# Patient Record
Sex: Male | Born: 1950
Health system: Southern US, Community
[De-identification: ages and names within clinical notes are randomized; demographics above are authoritative.]

## PROBLEM LIST (undated history)

## (undated) DIAGNOSIS — Z923 Personal history of irradiation: Secondary | ICD-10-CM

## (undated) DIAGNOSIS — D229 Melanocytic nevi, unspecified: Secondary | ICD-10-CM

## (undated) DIAGNOSIS — C4492 Squamous cell carcinoma of skin, unspecified: Secondary | ICD-10-CM

## (undated) DIAGNOSIS — R972 Elevated prostate specific antigen [PSA]: Secondary | ICD-10-CM

## (undated) DIAGNOSIS — G44029 Chronic cluster headache, not intractable: Secondary | ICD-10-CM

## (undated) DIAGNOSIS — D039 Melanoma in situ, unspecified: Secondary | ICD-10-CM

## (undated) DIAGNOSIS — C44320 Squamous cell carcinoma of skin of unspecified parts of face: Secondary | ICD-10-CM

## (undated) DIAGNOSIS — C44622 Squamous cell carcinoma of skin of right upper limb, including shoulder: Secondary | ICD-10-CM

## (undated) DIAGNOSIS — E785 Hyperlipidemia, unspecified: Secondary | ICD-10-CM

## (undated) DIAGNOSIS — C4491 Basal cell carcinoma of skin, unspecified: Secondary | ICD-10-CM

## (undated) DIAGNOSIS — K219 Gastro-esophageal reflux disease without esophagitis: Secondary | ICD-10-CM

## (undated) DIAGNOSIS — C439 Malignant melanoma of skin, unspecified: Secondary | ICD-10-CM

## (undated) DIAGNOSIS — C61 Malignant neoplasm of prostate: Secondary | ICD-10-CM

## (undated) HISTORY — PX: PROSTATE SURGERY: SHX751

## (undated) HISTORY — DX: Hyperlipidemia, unspecified: E78.5

## (undated) HISTORY — PX: APPENDECTOMY: SHX54

## (undated) HISTORY — PX: COLONOSCOPY: SHX174

---
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
## 1997-08-18 DIAGNOSIS — C44622 Squamous cell carcinoma of skin of right upper limb, including shoulder: Secondary | ICD-10-CM

## 1997-08-18 HISTORY — DX: Squamous cell carcinoma of skin of right upper limb, including shoulder: C44.622

## 1998-01-08 DIAGNOSIS — D039 Melanoma in situ, unspecified: Secondary | ICD-10-CM

## 1998-01-08 HISTORY — DX: Melanoma in situ, unspecified: D03.9

## 2006-02-20 DIAGNOSIS — D229 Melanocytic nevi, unspecified: Secondary | ICD-10-CM

## 2006-02-20 DIAGNOSIS — C4491 Basal cell carcinoma of skin, unspecified: Secondary | ICD-10-CM

## 2006-02-20 DIAGNOSIS — C439 Malignant melanoma of skin, unspecified: Secondary | ICD-10-CM

## 2006-02-20 HISTORY — DX: Melanocytic nevi, unspecified: D22.9

## 2006-02-20 HISTORY — DX: Malignant melanoma of skin, unspecified: C43.9

## 2006-02-20 HISTORY — DX: Basal cell carcinoma of skin, unspecified: C44.91

## 2008-06-01 ENCOUNTER — Encounter: Admission: RE | Admit: 2008-06-01 | Discharge: 2008-06-01 | Payer: Self-pay | Admitting: Orthopedic Surgery

## 2009-07-27 DIAGNOSIS — D039 Melanoma in situ, unspecified: Secondary | ICD-10-CM

## 2009-07-27 HISTORY — DX: Melanoma in situ, unspecified: D03.9

## 2010-02-27 ENCOUNTER — Encounter: Payer: Self-pay | Admitting: Orthopedic Surgery

## 2011-06-20 ENCOUNTER — Other Ambulatory Visit: Payer: Self-pay | Admitting: Internal Medicine

## 2011-07-25 ENCOUNTER — Ambulatory Visit (INDEPENDENT_AMBULATORY_CARE_PROVIDER_SITE_OTHER): Payer: BC Managed Care – PPO | Admitting: Internal Medicine

## 2011-07-25 VITALS — BP 104/65 | HR 60 | Temp 97.9°F | Resp 16 | Ht 70.5 in | Wt 210.0 lb

## 2011-07-25 DIAGNOSIS — Z0000 Encounter for general adult medical examination without abnormal findings: Secondary | ICD-10-CM

## 2011-07-25 DIAGNOSIS — E785 Hyperlipidemia, unspecified: Secondary | ICD-10-CM

## 2011-07-25 LAB — LIPID PANEL
LDL Cholesterol: 161 mg/dL — ABNORMAL HIGH (ref 0–99)
Total CHOL/HDL Ratio: 4.9 Ratio
VLDL: 34 mg/dL (ref 0–40)

## 2011-07-25 LAB — POCT CBC
Hemoglobin: 14.7 g/dL (ref 14.1–18.1)
Lymph, poc: 1.6 (ref 0.6–3.4)
MCHC: 32.5 g/dL (ref 31.8–35.4)
MPV: 10.2 fL (ref 0–99.8)
POC Granulocyte: 3.4 (ref 2–6.9)
POC MID %: 8.9 %M (ref 0–12)
RDW, POC: 13.1 %

## 2011-07-25 LAB — COMPREHENSIVE METABOLIC PANEL
ALT: 27 U/L (ref 0–53)
Alkaline Phosphatase: 53 U/L (ref 39–117)
CO2: 29 mEq/L (ref 19–32)
Sodium: 141 mEq/L (ref 135–145)
Total Bilirubin: 0.6 mg/dL (ref 0.3–1.2)
Total Protein: 6.6 g/dL (ref 6.0–8.3)

## 2011-07-25 MED ORDER — FLUOXETINE HCL 40 MG PO CAPS: 40.0000 mg | ORAL_CAPSULE | Freq: Every day | ORAL | Status: DC

## 2011-07-25 NOTE — Progress Notes (Addendum)
  Subjective:    Patient ID: Allen Burnett, male    DOB: 02-Mar-1950, 61 y.o.   MRN: [id number]  HPI Here for annual physical examination/he's doing very well, very active, very athletic, controlling his weight fairly well. If he could reduce the stress of his work things would be great. 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 He continues to use fluoxetine and this helps with anxiety.  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 He appears well-developed and well-nourished.  HENT:  Head: Normocephalic.  Right Ear: External ear normal.  Left Ear: External ear normal.  Nose: Nose normal.  Mouth/Throat: Oropharynx is clear and moist.  Eyes: Conjunctivae and EOM are normal. Pupils are equal, round, and reactive to light. Right eye exhibits no discharge. Left eye exhibits no discharge.  Neck: Normal range of motion. Neck supple. No thyromegaly present.  Cardiovascular: Normal rate, regular rhythm, normal heart sounds and intact distal pulses.   No murmur heard. Pulmonary/Chest: Effort normal and breath sounds normal.  Abdominal: Soft. Bowel sounds are normal. He exhibits no mass.  Musculoskeletal: He exhibits no edema.  Neurological: He is alert and oriented to person, place, and time. He has normal reflexes.  Skin: No rash noted.  Psychiatric: He has a normal mood and affect. His behavior is normal. Judgment and thought content normal.          Assessment & Plan:  Annual  physical- Doing very well  Recheck lipid profile and PSA Continue fluoxetine 40 mg daily-although he says he takes it every other day but is happy with the outcome  Addendum: Your PSA value has gone up again and it is time to consult urology to consider whether you need a biopsy. Your past values: 2006=1.8 2007=3.1 and you repeated it with Dr Hillis Range and it was 2.1 2009=3.4 2101=3.46 2011=3.16 2012=3.76 Although this could be due to benign prostate hypertrophy,The rate of rise and the absolute value both suggest that you need A biopsy.

## 2011-07-26 ENCOUNTER — Encounter: Payer: Self-pay | Admitting: Internal Medicine

## 2011-07-26 ENCOUNTER — Other Ambulatory Visit: Payer: Self-pay | Admitting: Internal Medicine

## 2011-07-26 DIAGNOSIS — R972 Elevated prostate specific antigen [PSA]: Secondary | ICD-10-CM

## 2011-10-11 HISTORY — PX: PROSTATE BIOPSY: SHX241

## 2011-11-01 ENCOUNTER — Telehealth: Payer: Self-pay

## 2011-11-01 NOTE — Telephone Encounter (Signed)
PT STATES HE HAVE BEEN DIAGNOSED WITH CANCER AND WOULD LIKE TO SPEAK WITH DR DOOLITTLE BEFORE HE GOES NEXT WEEK FOR HIS CONSULTATION PLEASE CALL [phone]

## 2011-11-08 ENCOUNTER — Encounter: Payer: Self-pay | Admitting: *Deleted

## 2011-11-08 DIAGNOSIS — C61 Malignant neoplasm of prostate: Secondary | ICD-10-CM | POA: Insufficient documentation

## 2011-11-16 ENCOUNTER — Ambulatory Visit: Payer: Self-pay

## 2011-11-16 ENCOUNTER — Ambulatory Visit: Admission: RE | Admit: 2011-11-16 | Payer: Self-pay | Source: Ambulatory Visit | Admitting: Radiation Oncology

## 2011-11-21 ENCOUNTER — Encounter: Payer: Self-pay | Admitting: Radiation Oncology

## 2011-11-21 ENCOUNTER — Ambulatory Visit
Admission: RE | Admit: 2011-11-21 | Discharge: 2011-11-21 | Disposition: A | Discharge status: Home / Self Care | Payer: BC Managed Care – PPO | Source: Ambulatory Visit | Attending: Radiation Oncology | Admitting: Radiation Oncology

## 2011-11-21 VITALS — BP 140/72 | HR 74 | Temp 97.8°F | Resp 20 | Wt 218.9 lb

## 2011-11-21 DIAGNOSIS — C61 Malignant neoplasm of prostate: Secondary | ICD-10-CM

## 2011-11-21 DIAGNOSIS — Z510 Encounter for antineoplastic radiation therapy: Secondary | ICD-10-CM | POA: Insufficient documentation

## 2011-11-21 HISTORY — DX: Elevated prostate specific antigen (PSA): R97.20

## 2011-11-21 HISTORY — DX: Malignant neoplasm of prostate: C61

## 2011-11-21 NOTE — Progress Notes (Signed)
New consult Prostate Cancer DX 10/11/11 : Adenocarcinoma,gleason= 3+3=6,PSA=5.39,volume=30cc Alert,oriented x3, no c/o of urinary issues, regular bowel movements, up at night 2-3x to void, Interested in [employer] children 1 brother living Colon Cancer dx age 60, now 16

## 2011-11-21 NOTE — Addendum Note (Signed)
Encounter addended by: Maryln Gottron, MD on: 11/21/2011 10:24 AM<BR>     Documentation filed: Notes Section

## 2011-11-21 NOTE — Progress Notes (Addendum)
[facility] Radiation Oncology NEW PATIENT EVALUATION  Name: Allen Burnett MRN: [id number]  Date:   11/21/2011           DOB: [birth year]  Status: outpatient   CC:   Valetta Fuller, MD , Dr. Ellamae Sia, Dr. Marcine Matar   REFERRING PHYSICIAN: Valetta Fuller, MD   DIAGNOSIS:  Stage TI C. favorable risk adenocarcinoma prostate  HISTORY OF PRESENT ILLNESS:  Allen Burnett is a 61 y.o. male who is seen today for the courtesy of Dr. Isabel Caprice and Dr. Retta Diones of for evaluation of his stage TI C. favorable risk adenocarcinoma prostate. He was noted to have a gradual rise in his PSA from 3.4  In 2009 to 4.85 by June of 2013. He was initially seen by Dr. Retta Diones approximate 7 years ago with his PSA was just over 3. A repeat PSA was down to 2.1, and close observation was recommended. More recently, he was referred back to Dr. Retta Diones for further evaluation for his PSA of 4.85. This was repeated on 09/11/2011 in his PSA was 5.39.Marland Kitchen He underwent ultrasound-guided biopsies on 10/11/2011. His then have Gleason 6 (3+3) involving less than 5% of one core from right lateral base, less than 5% of one core from the right lateral mid gland, 5% of one core from the right mid gland, 20% of one core from the right apex, 40% of one core from the left lateral base, 5% of one core from the left lateral apex and 10% of one core from the left apex. His gland volume was 30 cc. He does have mild urinary obstructive symptomatology with an I PSS score of 12. No GI difficulties. He is potent. He is quite active and very fit. He was seen for a second surgical opinion with Dr. Isabel Caprice. He wants to avoid surgery.  PREVIOUS RADIATION THERAPY: No   PAST MEDICAL HISTORY:  has a past medical history of PSA elevation; Prostate cancer (10/11/11); and GERD (gastroesophageal reflux disease).     PAST SURGICAL HISTORY:  Past Surgical History  Procedure Date  . Prostate biopsy 10/11/11    gleason  3+3=6,PSA=5.39,Adenocarcinoma  . Appendectomy     age 36     FAMILY HISTORY: family history includes Cancer (age of onset:50) in his brother. His father.a heart attack followed by a stroke at age 73. His mother is 52, alive and well. No family history of prostate cancer.   SOCIAL HISTORY:  reports that he quit smoking about 27 years ago. His smoking use included Cigarettes. He has a 10 pack-year smoking history. He has quit using smokeless tobacco. His smokeless tobacco use included Chew. He reports that he drinks alcohol. He reports that he does not use illicit drugs. Married with 2 children. One child is a PA in [facility]. He works in Administrator, Civil Service.   ALLERGIES: Review of patient's allergies indicates no known allergies.   MEDICATIONS:  Current Outpatient Prescriptions  Medication Sig Dispense Refill  . diphenhydramine-acetaminophen (TYLENOL PM) 25-500 MG TABS Take 1 tablet by mouth at bedtime as needed.      . fish oil-omega-3 fatty acids 1000 MG capsule Take 1 g by mouth daily.      Marland Kitchen FLUoxetine (PROZAC) 40 MG capsule Take 40 mg by mouth daily. Takes 2-3x week for cluster headaches      . lansoprazole (PREVACID) 30 MG capsule Take 30 mg by mouth as needed.      . Melatonin 5 MG TABS Take 2 tablets  by mouth at bedtime as needed.      . Multiple Vitamin (MULTIVITAMIN) tablet Take 1 tablet by mouth daily.      Docia Barrier IN Inhale into the lungs.      . predniSONE (STERAPRED UNI-PAK) 10 MG tablet Take 10 mg by mouth daily.      . SUMAtriptan (IMITREX) 6 MG/0.5ML SOLN injection Inject 6 mg into the skin as needed. F      . verapamil (CALAN) 40 MG tablet Take 40 mg by mouth.      . DISCONTD: FLUoxetine (PROZAC) 40 MG capsule Take 1 capsule (40 mg total) by mouth daily.  90 capsule  3     REVIEW OF SYSTEMS:  Pertinent items are noted in HPI.    PHYSICAL EXAM: Somewhat anxious 61 year old white male appearing his stated age. Wt Readings from Last 3 Encounters:   11/21/11 218 lb 14.4 oz (99.292 kg)  07/25/11 210 lb (95.255 kg)   Temp Readings from Last 3 Encounters:  11/21/11 97.8 F (36.6 C) Oral  07/25/11 97.9 F (36.6 C) Oral   BP Readings from Last 3 Encounters:  11/21/11 140/72  07/25/11 104/65   Pulse Readings from Last 3 Encounters:  11/21/11 74  07/25/11 60   Head and neck examination: Grossly unremarkable. Nodes: Without palpable cervical or supraclavicular lymphadenopathy. Chest: Lungs clear. Heart: Regular in rhythm. Abdomen: Without masses organomegaly. Genitalia: Normal to inspection. Rectal: The prostate gland is normal in size and is without focal induration or nodularity. Extremities: Without edema. Neurologic examination: Grossly nonfocal    LABORATORY DATA:  Lab Results  Component Value Date   WBC 5.5 07/25/2011   HGB 14.7 07/25/2011   HCT 45.3 07/25/2011   MCV 91.6 07/25/2011   Lab Results  Component Value Date   NA 141 07/25/2011   K 4.3 07/25/2011   CL 105 07/25/2011   CO2 29 07/25/2011   Lab Results  Component Value Date   ALT 27 07/25/2011   AST 20 07/25/2011   ALKPHOS 53 07/25/2011   BILITOT 0.6 07/25/2011   PSA 5.39 from 09/11/2011.   IMPRESSION: Stage TI C. favorable risk adenocarcinoma prostate. I explained to the patient that his prognosis is related to his stage, Gleason score, and PSA level. All are favorable. His prognosis is also related to his disease volume and PSA doubling time. He appears to have a slow PSA doubling time, and he has low volume disease, yet multicentric disease. We discussed surgery versus close surveillance versus radiation therapy. We spent a good deal of time discussing seed implantation and also external beam/IMRT. We discussed the potential acute and late toxicities of each therapy. I told the patient that based on his age and performance status, I would strongly still consider surgery even though the expected cure rate with radiation therapy would be equivalent. He is opposed to  surgery. After lengthy discussion he is most interested in seed implantation which I think would be a good choice for him. We perform a CT arch this morning his prostate volume was measured to be 33 cc with a prosthetic length 3.3 cm, correlating with Dr. Lenoria Chime measurements. Consent is signed today. We'll move ahead with scheduling of his seed implant for mid to late November with Dr. Retta Diones.   PLAN: As discussed above.   I spent 60 minutes minutes face to face with the patient and more than 50% of that time was spent in counseling and/or coordination of care.

## 2011-11-21 NOTE — Progress Notes (Signed)
Please see the Nurse Progress Note in the MD Initial Consult Encounter for this patient. 

## 2011-11-21 NOTE — Progress Notes (Addendum)
Simulation/treatment planning note: The patient underwent a CT arch study for preparation of his prostate seed implant. He was taken to the CT simulator. His pelvis was scanned. The CT data set was sent to the treatment planning system. I contoured his prostate in his prostate volume is 33.8 cc and prosthetic length 3.3 cm. The data set was then projected over the bony arch and there is no bony arch interference. I prescribing 14,500 cGy to his prostate with.I-125 seeds utilizing the [employer] system.

## 2011-11-21 NOTE — Addendum Note (Signed)
Encounter addended by: Maryln Gottron, MD on: 11/21/2011  9:53 AM<BR>     Documentation filed: Notes Section

## 2011-11-22 ENCOUNTER — Encounter: Payer: Self-pay | Admitting: Radiation Oncology

## 2011-11-22 NOTE — Progress Notes (Signed)
   CT arch study. Prostate volume 33.8 cc, length 3.3 cm. See dictated note.

## 2011-11-29 ENCOUNTER — Encounter: Payer: Self-pay | Admitting: *Deleted

## 2011-12-01 ENCOUNTER — Ambulatory Visit (HOSPITAL_BASED_OUTPATIENT_CLINIC_OR_DEPARTMENT_OTHER)
Admission: RE | Admit: 2011-12-01 | Discharge: 2011-12-01 | Disposition: A | Discharge status: Home / Self Care | Payer: BC Managed Care – PPO | Source: Ambulatory Visit | Attending: Urology | Admitting: Urology

## 2011-12-01 ENCOUNTER — Encounter (HOSPITAL_BASED_OUTPATIENT_CLINIC_OR_DEPARTMENT_OTHER)
Admission: RE | Admit: 2011-12-01 | Discharge: 2011-12-01 | Disposition: A | Discharge status: Home / Self Care | Payer: BC Managed Care – PPO | Source: Ambulatory Visit | Attending: Urology | Admitting: Urology

## 2011-12-01 DIAGNOSIS — Z01811 Encounter for preprocedural respiratory examination: Secondary | ICD-10-CM | POA: Insufficient documentation

## 2011-12-01 DIAGNOSIS — C61 Malignant neoplasm of prostate: Secondary | ICD-10-CM | POA: Insufficient documentation

## 2011-12-20 ENCOUNTER — Telehealth: Payer: Self-pay | Admitting: *Deleted

## 2011-12-20 NOTE — Telephone Encounter (Signed)
CALLED PATIENT TO REMIND OF APPT., LVM FOR A RETURN CALL 

## 2011-12-21 ENCOUNTER — Encounter (HOSPITAL_BASED_OUTPATIENT_CLINIC_OR_DEPARTMENT_OTHER): Payer: Self-pay | Admitting: *Deleted

## 2011-12-21 LAB — COMPREHENSIVE METABOLIC PANEL
ALT: 24 U/L (ref 0–53)
AST: 16 U/L (ref 0–37)
CO2: 24 mEq/L (ref 19–32)
Calcium: 9.1 mg/dL (ref 8.4–10.5)
Chloride: 104 mEq/L (ref 96–112)
GFR calc non Af Amer: 79 mL/min — ABNORMAL LOW (ref >90)
Sodium: 137 mEq/L (ref 135–145)
Total Bilirubin: 0.3 mg/dL (ref 0.3–1.2)

## 2011-12-21 LAB — CBC
Platelets: 239 10*3/uL (ref 150–400)
RBC: 4.55 MIL/uL (ref 4.22–5.81)
WBC: 13.4 10*3/uL — ABNORMAL HIGH (ref 4.0–10.5)

## 2011-12-21 LAB — APTT: aPTT: 26 seconds (ref 24–37)

## 2011-12-21 NOTE — Progress Notes (Signed)
SPOKE W/ WIFE.  NPO AFTER MN. ARRIVES AT 0615. CURRENT LAB WORK, CXR AND EKG IN EPIC AND CHART. WILL TAKE PREVACID AND IF TAKES IN AM ALSO WILL TAKE  PREDNISONE AND VERAPAMIL AM OF SURG  W/ SIP OF WATER .  WILL DO FLEET ENEMA AM OF SURG. 

## 2011-12-27 ENCOUNTER — Telehealth: Payer: Self-pay | Admitting: *Deleted

## 2011-12-27 NOTE — Telephone Encounter (Signed)
CALLED PATIENT TO REMIND OF PROCEDURE FOR 12-28-11, LVM FOR A RETURN CALL

## 2011-12-27 NOTE — Anesthesia Preprocedure Evaluation (Addendum)
Anesthesia Evaluation  Patient identified by MRN, date of birth, ID band Patient awake    Reviewed: Allergy & Precautions, H&P , NPO status , Patient's Chart, lab work & pertinent test results  Airway Mallampati: II TM Distance: >3 FB Neck ROM: full    Dental No notable dental hx. (+) Teeth Intact and Dental Advisory Given   Pulmonary neg pulmonary ROS,  breath sounds clear to auscultation  Pulmonary exam normal       Cardiovascular Exercise Tolerance: Good negative cardio ROS  Rhythm:regular Rate:Normal     Neuro/Psych  Headaches, negative neurological ROS  negative psych ROS   GI/Hepatic negative GI ROS, Neg liver ROS, GERD-  Medicated and Controlled,  Endo/Other  negative endocrine ROS  Renal/GU negative Renal ROS  negative genitourinary   Musculoskeletal   Abdominal   Peds  Hematology negative hematology ROS (+)   Anesthesia Other Findings   Reproductive/Obstetrics negative OB ROS                          Anesthesia Physical Anesthesia Plan  ASA: II  Anesthesia Plan: General   Post-op Pain Management:    Induction: Intravenous  Airway Management Planned: LMA  Additional Equipment:   Intra-op Plan:   Post-operative Plan:   Informed Consent: I have reviewed the patients History and Physical, chart, labs and discussed the procedure including the risks, benefits and alternatives for the proposed anesthesia with the patient or authorized representative who has indicated his/her understanding and acceptance.   Dental Advisory Given  Plan Discussed with: CRNA and Surgeon  Anesthesia Plan Comments:         Anesthesia Quick Evaluation

## 2011-12-27 NOTE — H&P (Cosign Needed)
Urology History and Physical Exam  CC: Prostate cancer  HPI: 61 year old male presents for brachytherapy.His history is as follows:   He  was sent by Dr. Nichola Sizer in August for evaluation and management of an elevated PSA. This had been checked recently in his office, and returned 4.85 in June, 2013. I saw him about 7 years ago. At that time, his PSA was slightly over 3, and was down to 2.1 on repeat. Over the past few years, his PSA trend has been as follows:  2009-3.4 2010-3.46 2011-3.16 2012-3.76  There is no family history of prostate cancer, and the patient denies any significant lower urinary tract symptomatology. He denies any erectile dysfunction.   Repeat PSA at [facility] was 5.39. He subsequently underwent ultrasound and biopsy of his prostate on 10/11/2011. Prostatic size was just at 30 cc. 7/12 biopsies came back, all with Gleason 3+3 equals 6 histology. Four of these biopsies on the right were positive, 3 on the left.   He was counseled on treatment and has chosen to undergo brachytherapy.  PMH: Past Medical History  Diagnosis Date  . PSA elevation     2009=3.4,2010=3.46,2011=3.16,2012=3.76,07/2011=4.85  . Prostate cancer DX  10/11/11    Adenocarcinoma,gleason 3+3=6,PSA=5.39   volume=30cc  . Acid reflux OCCASIONALLY--  TAKE PREVACID  . Chronic cluster headache [facility]    PRN OXYGEN VIA [facility] WHEN FEELS HEADACHE STARTING/  ON PREVENTION MEDS    PSH: Past Surgical History  Procedure Date  . Prostate biopsy 10/11/11    gleason 3+3=6,PSA=5.39,Adenocarcinoma  . Appendectomy AGE 64    Allergies: No Known Allergies  Medications: No prescriptions prior to admission     Social History: History   Social History  . Marital Status: Married    Spouse Name: N/A    Number of Children: 2  . Years of Education: N/A   Occupational History  .      advertising sales   Social History Main Topics  . Smoking status: Former Smoker -- 1.0  packs/day for 10 years    Types: Cigarettes    Quit date: 07/24/1984  . Smokeless tobacco: Former Neurosurgeon    Types: Chew  . Alcohol Use: Yes     Comment: beer on weekends  . Drug Use: No  . Sexually Active: Not on file   Other Topics Concern  . Not on file   Social History Narrative  . No narrative on file    Family History: Family History  Problem Relation Age of Onset  . Cancer Brother 36    colon cancer now age 58    Review of Systems: Genitourinary, constitutional, skin, eye, otolaryngeal, hematologic/lymphatic, cardiovascular, pulmonary, endocrine, musculoskeletal, gastrointestinal, neurological and psychiatric system(s) were reviewed and pertinent findings if present are noted.  Genitourinary: urinary frequency and nocturia.      Physical Exam: @VITALS2 @ General: No acute distress.  Awake. Head:  Normocephalic.  Atraumatic. ENT:  EOMI.  Mucous membranes moist Neck:  Supple.  No lymphadenopathy. CV:  S1 present. S2 present. Regular rate. Pulmonary: Equal effort bilaterally.  Clear to auscultation bilaterally. Abdomen: Soft.  Non tender to palpation. Skin:  Normal turgor.  No visible rash. Extremity: No gross deformity of bilateral upper extremities.  No gross deformity of    bilateral lower extremities. Neurologic: Alert. Appropriate mood.    Studies:  No results found for this basename: HGB:2,WBC:2,PLT:2 in the last 72 hours  No results found for this basename: NA:2,K:2,CL:2,CO2:2,BUN:2,CREATININE:2,CALCIUM:2,MAGNESIUM:2,GFRNONAA:2,GFRAA:2 in the last 72  hours   No results found for this basename: PT:2,INR:2,APTT:2 in the last 72 hours   No components found with this basename: ABG:2    Assessment:  Stage T1c adenocarcinoma of the prostate  Plan: I-125 brachytherapy

## 2011-12-28 ENCOUNTER — Encounter (HOSPITAL_BASED_OUTPATIENT_CLINIC_OR_DEPARTMENT_OTHER): Payer: Self-pay | Admitting: *Deleted

## 2011-12-28 ENCOUNTER — Encounter (HOSPITAL_BASED_OUTPATIENT_CLINIC_OR_DEPARTMENT_OTHER): Payer: Self-pay | Admitting: Anesthesiology

## 2011-12-28 ENCOUNTER — Ambulatory Visit (HOSPITAL_BASED_OUTPATIENT_CLINIC_OR_DEPARTMENT_OTHER)
Admission: RE | Admit: 2011-12-28 | Discharge: 2011-12-28 | Disposition: A | Discharge status: Home / Self Care | Payer: BC Managed Care – PPO | Source: Ambulatory Visit | Attending: Urology | Admitting: Urology

## 2011-12-28 ENCOUNTER — Ambulatory Visit (HOSPITAL_BASED_OUTPATIENT_CLINIC_OR_DEPARTMENT_OTHER): Payer: BC Managed Care – PPO | Admitting: Anesthesiology

## 2011-12-28 ENCOUNTER — Encounter: Payer: Self-pay | Admitting: Radiation Oncology

## 2011-12-28 ENCOUNTER — Ambulatory Visit (HOSPITAL_COMMUNITY): Payer: BC Managed Care – PPO

## 2011-12-28 ENCOUNTER — Encounter (HOSPITAL_BASED_OUTPATIENT_CLINIC_OR_DEPARTMENT_OTHER)
Admission: RE | Disposition: A | Discharge status: Home / Self Care | Payer: Self-pay | Source: Ambulatory Visit | Attending: Urology

## 2011-12-28 DIAGNOSIS — C61 Malignant neoplasm of prostate: Secondary | ICD-10-CM | POA: Insufficient documentation

## 2011-12-28 DIAGNOSIS — Z87891 Personal history of nicotine dependence: Secondary | ICD-10-CM | POA: Insufficient documentation

## 2011-12-28 DIAGNOSIS — K219 Gastro-esophageal reflux disease without esophagitis: Secondary | ICD-10-CM | POA: Insufficient documentation

## 2011-12-28 DIAGNOSIS — Z923 Personal history of irradiation: Secondary | ICD-10-CM

## 2011-12-28 DIAGNOSIS — Z01812 Encounter for preprocedural laboratory examination: Secondary | ICD-10-CM | POA: Insufficient documentation

## 2011-12-28 HISTORY — PX: RADIOACTIVE SEED IMPLANT: SHX5150

## 2011-12-28 HISTORY — DX: Gastro-esophageal reflux disease without esophagitis: K21.9

## 2011-12-28 HISTORY — DX: Chronic cluster headache, not intractable: G44.029

## 2011-12-28 HISTORY — DX: Personal history of irradiation: Z92.3

## 2011-12-28 SURGERY — INSERTION, RADIATION SOURCE, PROSTATE
Anesthesia: General | Site: Prostate | Wound class: Clean Contaminated

## 2011-12-28 MED ORDER — LIDOCAINE HCL (CARDIAC) 20 MG/ML IV SOLN
INTRAVENOUS | Status: DC | PRN
  Administered 2011-12-28: 100 mg via INTRAVENOUS

## 2011-12-28 MED ORDER — IOHEXOL 350 MG/ML SOLN
INTRAVENOUS | Status: DC | PRN
  Administered 2011-12-28: 3 mL

## 2011-12-28 MED ORDER — CIPROFLOXACIN HCL 250 MG PO TABS: 250.0000 mg | ORAL_TABLET | Freq: Two times a day (BID) | ORAL | Status: DC

## 2011-12-28 MED ORDER — LACTATED RINGERS IV SOLN
INTRAVENOUS | Status: DC
  Administered 2011-12-28 (×3): via INTRAVENOUS
  Filled 2011-12-28: qty 1000

## 2011-12-28 MED ORDER — FENTANYL CITRATE 0.05 MG/ML IJ SOLN
25.0000 ug | INTRAMUSCULAR | Status: DC | PRN
  Administered 2011-12-28 (×2): 25 ug via INTRAVENOUS
  Filled 2011-12-28: qty 1

## 2011-12-28 MED ORDER — FLEET ENEMA 7-19 GM/118ML RE ENEM
1.0000 | ENEMA | Freq: Once | RECTAL | Status: DC
  Filled 2011-12-28: qty 1

## 2011-12-28 MED ORDER — HYDROCODONE-ACETAMINOPHEN 5-500 MG PO CAPS: 1.0000 | ORAL_CAPSULE | ORAL | Status: DC | PRN

## 2011-12-28 MED ORDER — ACETAMINOPHEN 10 MG/ML IV SOLN
INTRAVENOUS | Status: DC | PRN
  Administered 2011-12-28: 1000 mg via INTRAVENOUS

## 2011-12-28 MED ORDER — DEXAMETHASONE SODIUM PHOSPHATE 4 MG/ML IJ SOLN
INTRAMUSCULAR | Status: DC | PRN
  Administered 2011-12-28: 10 mg via INTRAVENOUS

## 2011-12-28 MED ORDER — CIPROFLOXACIN IN D5W 400 MG/200ML IV SOLN
400.0000 mg | INTRAVENOUS | Status: AC
  Administered 2011-12-28: 400 mg via INTRAVENOUS
  Filled 2011-12-28: qty 200

## 2011-12-28 MED ORDER — EPHEDRINE SULFATE 50 MG/ML IJ SOLN
INTRAMUSCULAR | Status: DC | PRN
  Administered 2011-12-28: 5 mg via INTRAVENOUS
  Administered 2011-12-28 (×2): 10 mg via INTRAVENOUS

## 2011-12-28 MED ORDER — PROPOFOL 10 MG/ML IV BOLUS
INTRAVENOUS | Status: DC | PRN
  Administered 2011-12-28: 250 mg via INTRAVENOUS

## 2011-12-28 MED ORDER — KETOROLAC TROMETHAMINE 30 MG/ML IJ SOLN
INTRAMUSCULAR | Status: DC | PRN
  Administered 2011-12-28: 30 mg via INTRAVENOUS

## 2011-12-28 MED ORDER — MIDAZOLAM HCL 5 MG/5ML IJ SOLN
INTRAMUSCULAR | Status: DC | PRN
  Administered 2011-12-28: 2 mg via INTRAVENOUS

## 2011-12-28 MED ORDER — LACTATED RINGERS IV SOLN
INTRAVENOUS | Status: DC
  Filled 2011-12-28: qty 1000

## 2011-12-28 MED ORDER — STERILE WATER FOR IRRIGATION IR SOLN
Status: DC | PRN
  Administered 2011-12-28: 3000 mL via INTRAVESICAL

## 2011-12-28 MED ORDER — OXYCODONE HCL 5 MG PO TABS
5.0000 mg | ORAL_TABLET | Freq: Once | ORAL | Status: AC
  Administered 2011-12-28: 5 mg via ORAL
  Filled 2011-12-28: qty 1

## 2011-12-28 MED ORDER — ONDANSETRON HCL 4 MG/2ML IJ SOLN
INTRAMUSCULAR | Status: DC | PRN
  Administered 2011-12-28: 4 mg via INTRAVENOUS

## 2011-12-28 MED ORDER — FENTANYL CITRATE 0.05 MG/ML IJ SOLN
INTRAMUSCULAR | Status: DC | PRN
  Administered 2011-12-28 (×2): 50 ug via INTRAVENOUS
  Administered 2011-12-28 (×2): 25 ug via INTRAVENOUS

## 2011-12-28 SURGICAL SUPPLY — 26 items
BAG URINE DRAINAGE (UROLOGICAL SUPPLIES) ×2 IMPLANT
BLADE SURG ROTATE 9660 (MISCELLANEOUS) ×2 IMPLANT
CATH FOLEY 2WAY SLVR  5CC 16FR (CATHETERS) ×1
CATH FOLEY 2WAY SLVR 5CC 16FR (CATHETERS) ×1 IMPLANT
CATH ROBINSON RED A/P 20FR (CATHETERS) ×2 IMPLANT
CLOTH BEACON ORANGE TIMEOUT ST (SAFETY) ×2 IMPLANT
COVER MAYO STAND STRL (DRAPES) ×2 IMPLANT
COVER TABLE BACK 60X90 (DRAPES) ×2 IMPLANT
DRSG TEGADERM 4X4.75 (GAUZE/BANDAGES/DRESSINGS) ×2 IMPLANT
DRSG TEGADERM 8X12 (GAUZE/BANDAGES/DRESSINGS) ×2 IMPLANT
GLOVE BIO SURGEON STRL SZ 6.5 (GLOVE) ×2 IMPLANT
GLOVE BIO SURGEON STRL SZ7.5 (GLOVE) ×8 IMPLANT
GLOVE BIO SURGEON STRL SZ8 (GLOVE) ×4 IMPLANT
GLOVE ECLIPSE 6.0 STRL STRAW (GLOVE) ×2 IMPLANT
GLOVE ECLIPSE 8.0 STRL XLNG CF (GLOVE) IMPLANT
GLOVE INDICATOR 6.5 STRL GRN (GLOVE) ×2 IMPLANT
GOWN PREVENTION PLUS LG XLONG (DISPOSABLE) ×2 IMPLANT
GOWN STRL REIN XL XLG (GOWN DISPOSABLE) ×2 IMPLANT
HOLDER FOLEY CATH W/STRAP (MISCELLANEOUS) ×2 IMPLANT
PACK CYSTOSCOPY (CUSTOM PROCEDURE TRAY) ×2 IMPLANT
SPONGE GAUZE 4X4 12PLY (GAUZE/BANDAGES/DRESSINGS) ×2 IMPLANT
SYRINGE 10CC LL (SYRINGE) ×2 IMPLANT
UNDERPAD 30X30 INCONTINENT (UNDERPADS AND DIAPERS) ×4 IMPLANT
WATER STERILE IRR 3000ML UROMA (IV SOLUTION) ×2 IMPLANT
WATER STERILE IRR 500ML POUR (IV SOLUTION) ×2 IMPLANT
selectseed ×2 IMPLANT

## 2011-12-28 NOTE — Progress Notes (Signed)
End of treatment summary:  CC: Dr. Ellamae Sia, Dr. Marcine Matar  Diagnosis: Stage TI C. favorable risk adenocarcinoma prostate  Requesting physician: Dr. Marcine Matar  Intent: Curative  Date of implant: 12/28/2011  Site/dose: A prostate 14,500 cGy, isotope I-125 utilizing 75 seeds and 29 active needles. Individual seed activity 0.43 mCi per seed for a total implant activity of 32.1 mCi.  Narrative: The patient appears to have undergone a successful Nucletron seed Selectron implant with Dr. Retta Diones.  Plan: Followup visit with both of [place] in approximately 3 weeks. We will obtain a CT scan at that time to assess the quality of his implant and perform post implant dosimetry.

## 2011-12-28 NOTE — H&P (View-Only) (Signed)
SPOKE W/ WIFE.  NPO AFTER MN. ARRIVES AT 0615. CURRENT LAB WORK, CXR AND EKG IN EPIC AND CHART. WILL TAKE PREVACID AND IF TAKES IN AM ALSO WILL TAKE  PREDNISONE AND VERAPAMIL AM OF SURG  W/ SIP OF WATER .  WILL DO FLEET ENEMA AM OF SURG.

## 2011-12-28 NOTE — Progress Notes (Signed)
[facility] Radiation Oncology Brachytherapy Operative Procedure Note  Name: Jevonte Sung MRN: [id number]  Date:   11/24/2011           DOB: 15-Feb-1950  Status:outpatient    YQ:MVHQIONGE, Harrel Lemon, MD  Dr. Marcine Matar DIAGNOSIS: A 61 year old gentlemen with stage T1 C. adenocarcinoma of the prostate with a Gleason of 6 and a PSA of 5.4.  PROCEDURE: Insertion of radioactive I-125 seeds into the prostate gland.  RADIATION DOSE: 145 Gy, definitive therapy.  TECHNIQUE: Denali Gotto was brought to the operating room with Dr. Retta Diones. He was placed in the dorsolithotomy position. He was catheterized and a rectal tube was inserted. The perineum was shaved, prepped and draped. The ultrasound probe was then introduced into the rectum to see the prostate gland.  TREATMENT DEVICE: A needle grid was attached to the ultrasound probe stand and anchor needles were placed.  COMPLEX ISODOSE CALCULATION: The prostate was imaged in 3D using a sagittal sweep of the prostate probe. These images were transferred to the planning computer. There, the prostate, urethra and rectum were defined on each axial reconstructed image. Then, the software created an optimized plan and a few seed positions were adjusted. Then the accepted plan was uploaded to the seed Selectron afterloading unit.  SPECIAL TREATMENT PROCEDURE/SUPERVISION AND HANDLING: The Nucletron FIRST system was used to place the needles under sagittal guidance. A total of 29 needles were used to deposit 75 seeds in the prostate gland. The individual seed activity was 0.43 mCi for a total implant activity of 32.1 mCi.  COMPLEX SIMULATION: At the end of the procedure, an anterior radiograph of the pelvis was obtained to document seed positioning and count. Cystoscopy was performed to check the urethra and bladder.  MICRODOSIMETRY: At the end of the procedure, the patient was emitting 0.22 mrem/hr at 1 meter. Accordingly, he was  considered safe for hospital discharge.  PLAN: The patient will return to the radiation oncology clinic for post implant CT dosimetry in three weeks.

## 2011-12-28 NOTE — Op Note (Signed)
Preoperative diagnosis: Clinical stage TI C adenocarcinoma the prostate   Postoperative diagnosis: Same   Procedure: I-125 prostate seed implantation with Nucletron robotic implanter   Surgeon: Bertram Millard. Karlon Schlafer M.D.  Radiation Oncologist:Murray  Anesthesia: Gen.   Indications: Patient  was diagnosed with clinical stage TI C prostate cancer. We had extensive discussion with him about treatment options versus. He elected to proceed with seed implantation. He underwent consultation my office as well as with Dr. Dayton Scrape. He appeared to understand the advantages disadvantages potential risks of this treatment option. Full informed consent has been obtained.   Technique and findings: Patient was brought the operating room where he had successful induction of general anesthesia. He was placed in dorso-lithotomy position and prepped and draped in usual manner. Appropriate surgical timeout was performed. Radiation oncology department placed a transrectal ultrasound probe anchoring stand. Foley catheter with contrast in the balloon was inserted without difficulty. Anchoring needles were placed within the prostate. Rectal tube was placed. Real-time contouring of the urethra prostate and rectum were performed and the dosing parameters were established. Targeted dose was 145.00 gray.  I was then called  to the operating suite suite for placement of the needles. A second timeout was performed. All needle passage was done with real-time transrectal ultrasound guidance with the sagittal plane. A total of 29 needles were placed. The implantation itself was done with the robotic implanter. 75 active seeds were implanted. A Foley catheter was removed and flexible cystoscopy failed to show any seeds outside the prostate. Bladder showed mild trabeculations and bilateral Hutch diverticulae. No tumors or foreign bodies were noted.Each was entered and found to have normal appearing urothelium. The patient was brought to  recovery room in stable condition.

## 2011-12-28 NOTE — Transfer of Care (Signed)
Immediate Anesthesia Transfer of Care Note  Patient: Allen Burnett  Procedure(s) Performed: Procedure(s) (LRB): RADIOACTIVE SEED IMPLANT (N/A)  Patient Location: PACU  Anesthesia Type: General  Level of Consciousness: drowsy, arouses to name, follows commands  Airway & Oxygen Therapy: Patient Spontanous Breathing and Patient connected to face mask oxygen  Post-op Assessment: Report given to PACU RN and Post -op Vital signs reviewed and stable  Post vital signs: Reviewed and stable  Complications: No apparent anesthesia complications

## 2011-12-28 NOTE — Interval H&P Note (Signed)
History and Physical Interval Note:  12/28/2011 7:36 AM  Allen Burnett  has presented today for surgery, with the diagnosis of PROSTATE CANCER  The various methods of treatment have been discussed with the patient and family. After consideration of risks, benefits and other options for treatment, the patient has consented to  Procedure(s) (LRB) with comments: RADIOACTIVE SEED IMPLANT (N/A) - DR PORTABLE NEEDED as a surgical intervention .  The patient's history has been reviewed, patient examined, no change in status, stable for surgery.  I have reviewed the patient's chart and labs.  Questions were answered to the patient's satisfaction.     Chelsea Aus

## 2011-12-28 NOTE — Anesthesia Postprocedure Evaluation (Signed)
  Anesthesia Post-op Note  Patient: Allen Burnett  Procedure(s) Performed: Procedure(s) (LRB): RADIOACTIVE SEED IMPLANT (N/A)  Patient Location: PACU  Anesthesia Type: General  Level of Consciousness: awake and alert   Airway and Oxygen Therapy: Patient Spontanous Breathing  Post-op Pain: mild  Post-op Assessment: Post-op Vital signs reviewed, Patient's Cardiovascular Status Stable, Respiratory Function Stable, Patent Airway and No signs of Nausea or vomiting  Last Vitals:  Filed Vitals:   12/28/11 1030  BP: 115/62  Pulse: 77  Temp:   Resp: 10    Post-op Vital Signs: stable   Complications: No apparent anesthesia complications

## 2011-12-28 NOTE — Anesthesia Procedure Notes (Signed)
Procedure Name: LMA Insertion Date/Time: 12/28/2011 7:45 AM Performed by: Norva Pavlov Pre-anesthesia Checklist: Patient identified, Emergency Drugs available, Suction available and Patient being monitored Patient Re-evaluated:Patient Re-evaluated prior to inductionOxygen Delivery Method: Circle System Utilized Preoxygenation: Pre-oxygenation with 100% oxygen Intubation Type: IV induction Ventilation: Mask ventilation without difficulty LMA: LMA inserted LMA Size: 5.0 Number of attempts: 1 Airway Equipment and Method: bite block Placement Confirmation: positive ETCO2 Tube secured with: Tape Dental Injury: Teeth and Oropharynx as per pre-operative assessment

## 2011-12-28 NOTE — Discharge Instructions (Signed)
°  Post Anesthesia Home Care Instructions  Activity: Get plenty of rest for the remainder of the day. A responsible adult should stay with you for 24 hours following the procedure.  For the next 24 hours, DO NOT: -Drive a car -Advertising copywriter -Drink alcoholic beverages -Take any medication unless instructed by your physician -Make any legal decisions or sign important papers.  Meals: Start with liquid foods such as gelatin or soup. Progress to regular foods as tolerated. Avoid greasy, spicy, heavy foods. If nausea and/or vomiting occur, drink only clear liquids until the nausea and/or vomiting subsides. Call your physician if vomiting continues.  Special Instructions/Symptoms: Your throat may feel dry or sore from the anesthesia or the breathing tube placed in your throat during surgery. If this causes discomfort, gargle with warm salt water. The discomfort should disappear within 24 hours.    Radioactive Seed Implant Home Care Instructions   Activity:    Rest for the remainder of the day.  Do not drive  Or operate equipment today.  You may resume normal     activities in a few days as instructed by your physician, without risk of harmful radiation       exposure to those around you, provided you follow the time and distance precautions on the Radiation     Oncology Instruction Sheet.   Meals: Drink plenty of lipuids and eat light foods, such as gelatin or soup this evening .  You may return to normal meal    plan tomorrow.  Return To Work: You may return to work as instructed by Designer, multimedia.  Special Instruction: Remove foley catheter ***.  To remove foley, cut short end of  "Y" on foley catheter.  Drain water.  After the    water has drained, pull catheter from bladder.   If any seeds are found, use tweezers to pick up seeds and place in a glass    container of any kind and bring to your physician's office.  Call your physician if any of these symptoms occur:   Persistent or  heavy bleeding  Urine stream diminishes or stops completely after catheter is removed  Fever equal to or greater than 101 degrees F  Cloudy urine with a strong foul odor  Severe pain  You may feel some burning pain and/or hesitancy when you urinate after the catheter is removed.  These symptoms may increase over the next few weeks, but should diminish within forur to six weeks.  Applying moist heat to the lower abdomen or a hot tub bath may help relieve the pain.  If the discomfort becomes severe, please call your physician for additional medications.  Follow-up (Date of Return Visit to Physician): ***  Patient:_______________________________   @date @  Nurse:________________________________ @date @

## 2012-01-01 ENCOUNTER — Encounter (HOSPITAL_BASED_OUTPATIENT_CLINIC_OR_DEPARTMENT_OTHER): Payer: Self-pay | Admitting: Urology

## 2012-01-18 ENCOUNTER — Encounter: Payer: Self-pay | Admitting: Radiation Oncology

## 2012-01-22 ENCOUNTER — Telehealth: Payer: Self-pay | Admitting: *Deleted

## 2012-01-22 NOTE — Telephone Encounter (Signed)
CALLED PATIENT TO REMIND OF APPTS. FOR 01-23-12, CONFIRMED APPTS.

## 2012-01-22 NOTE — Telephone Encounter (Signed)
XXXX 

## 2012-01-23 ENCOUNTER — Encounter: Payer: Self-pay | Admitting: Radiation Oncology

## 2012-01-23 ENCOUNTER — Ambulatory Visit
Admission: RE | Admit: 2012-01-23 | Discharge: 2012-01-23 | Disposition: A | Discharge status: Home / Self Care | Payer: BC Managed Care – PPO | Source: Ambulatory Visit | Attending: Radiation Oncology | Admitting: Radiation Oncology

## 2012-01-23 VITALS — BP 122/76 | HR 65 | Temp 97.6°F | Wt 215.0 lb

## 2012-01-23 DIAGNOSIS — C61 Malignant neoplasm of prostate: Secondary | ICD-10-CM

## 2012-01-23 HISTORY — DX: Personal history of irradiation: Z92.3

## 2012-01-23 MED ORDER — TAMSULOSIN HCL 0.4 MG PO CAPS: 0.4000 mg | ORAL_CAPSULE | Freq: Every day | ORAL | Status: DC

## 2012-01-23 NOTE — Progress Notes (Signed)
Patient here for post seed Prostate Cancer 75 seeds, Dr. Marcine Matar 12/28/2011

## 2012-01-23 NOTE — Progress Notes (Signed)
CT complex simulation note: The patient was taken to the CT simulator. His pelvis was scanned. He appears to have a favorable seed distribution. This CT data set was sent to the [facility] system for contouring of his prostate and rectum. He is now ready for 3-D simulation to assess the quality of his implant.

## 2012-01-23 NOTE — Progress Notes (Signed)
Followup note:  Allen Burnett visits today approximately 3 weeks following his prostate seed implant with Dr. Retta Diones in the management of his stage TI C. favorable risk adenocarcinoma prostate. He is having urinary frequency, urgency and some slowing of his stream. He has nocturia x2-3. His symptoms are bothersome. No rectal symptoms. He tells me that he does not have an appointment to see Dr. Retta Diones until February. His CT scan from today shows an excellent seed distribution although there are a few seeds adjacent to the rectum.  Physical examination:  Wt Readings from Last 3 Encounters:  01/23/12 215 lb (97.523 kg)  12/28/11 216 lb 5 oz (98.119 kg)  12/28/11 216 lb 5 oz (98.119 kg)   Temp Readings from Last 3 Encounters:  01/23/12 97.6 F (36.4 C)   12/28/11 97.2 F (36.2 C) Oral  12/28/11 97.2 F (36.2 C) Oral   BP Readings from Last 3 Encounters:  01/23/12 122/76  12/28/11 117/70  12/28/11 117/70   Pulse Readings from Last 3 Encounters:  01/23/12 65  12/28/11 72  12/28/11 72   Rectal examination not performed.  Impression: I believe he does have some degree of urinary outlet obstruction and I'll start him on tamsulosin. If his symptoms do not improve then we will start Ditropan XL later this week.  Plan: The patient is to call me this Thursday morning to give me an update on his urinary status. We'll move ahead with his post implant dosimetry and forward the results to Dr. Retta Diones.

## 2012-01-23 NOTE — Progress Notes (Signed)
FU post Prostate Seed Implant.  Reports Hesistancy of stream with intermittent burning.   Denies any diarrhea or proctitis.

## 2012-01-25 ENCOUNTER — Encounter: Payer: Self-pay | Admitting: Radiation Oncology

## 2012-01-25 NOTE — Progress Notes (Signed)
Post implant CT dosimetry/3-D simulation note:  Allen Burnett underwent post-implant CT dosimetry/3-D simulation to assess the quality of his implant. His intraoperative prostate volume by ultrasound was 39.8 cc while his postoperative prostate volume by CT was 38.2 cc. Dose volume histograms were obtained for the prostate/rectum. His prostate D 90 is 112.4% and V100 94.2%, both excellent. 2.15 cc of rectum received the prescribed dose of 14,500 cGy. In summary, the patient has excellent post implant dosimetry with respect to coverage of his prostate, but he is at a slightly higher increased risk for late rectal toxicity.

## 2012-02-12 ENCOUNTER — Encounter: Payer: Self-pay | Admitting: Radiation Oncology

## 2012-02-12 NOTE — Progress Notes (Signed)
Chart note: Allen Burnett called me last week to tell me he was having more urinary frequency and urgency. I started him on Flomax last week with slight improvement of his symptoms 2 days later. He calls me back today to tell me that he has what sounds like "spasms" with urinary urgency. His stream remains slow. I told him to increase his Flomax to twice a day dosage, but I would like for Dr. Retta Diones to evaluate him before starting an anti-spasmodic medication just to make sure that he does not go into urinary retention if his symptoms are obstructive in etiology rather than spasmodic. I'm not sure if Dr. Retta Diones needs to perform a post void ultrasound to check for bladder emptying. He will call me back in 2 days. If he is not improved then I will kindly ask Dr. Retta Diones to see him.

## 2012-02-19 ENCOUNTER — Encounter: Payer: Self-pay | Admitting: Radiation Oncology

## 2012-02-19 NOTE — Progress Notes (Signed)
I called Allen Burnett this morning, he tells me that he is doing very well from a urinary standpoint taking Flomax twice a day. He'll continue this for the time being. He'll see Dr. Retta Diones later in February.

## 2012-03-07 ENCOUNTER — Other Ambulatory Visit: Payer: Self-pay | Admitting: Radiation Oncology

## 2012-03-07 MED ORDER — TAMSULOSIN HCL 0.4 MG PO CAPS: 0.4000 mg | ORAL_CAPSULE | Freq: Two times a day (BID) | ORAL | Status: DC

## 2012-05-06 ENCOUNTER — Encounter: Payer: Self-pay | Admitting: Radiation Oncology

## 2012-05-06 NOTE — Progress Notes (Signed)
Chart note: I called Allen Burnett to get an update on his urinary habits. He still taking Flomax by mouth twice a day, but his obstruction urinary symptoms are slowly improving. I told him that he had excellent coverage of his prostate, but slightly higher dose than planned to his rectum based on volume of rectum received the prescribed dose. However, this was rather focal, and remaining rectal dose volume histogram numbers look satisfactory. He'll see Dr. Retta Diones for a followup visit in late May after a PSA. Dr. Retta Diones will keep me posted on his progress. He was also advised to make sure that he tells him he gastroenterologist that plans on doing a colonoscopy in the future that he sure to tell him that he had a prostate seed implant and that the anterior rectal wall in the vicinity of the prostate should never be biopsied.

## 2012-09-18 ENCOUNTER — Telehealth: Payer: Self-pay | Admitting: Oncology

## 2012-09-18 NOTE — Telephone Encounter (Signed)
Received a call from Allen Burnett, patient of Dr. Rennie Plowman.  He is requesting a refill of flomax.  He uses [facility] on [address].

## 2012-09-18 NOTE — Telephone Encounter (Signed)
Called and spoke to [place] at [facility] on [address].  Verified that Mr Allen Burnett is taking Flomax 0.4 mg capsule by mouth 2 times daily.  A refill was given for Flomax 0.4 mg take 1 capsule by mouth 2 times daily.  Dispense 60 tablets. O refills.  Called Mr. Allen Burnett to let him know that the refill had been called in.

## 2012-10-02 ENCOUNTER — Encounter: Payer: Self-pay | Admitting: Internal Medicine

## 2012-10-02 ENCOUNTER — Ambulatory Visit (INDEPENDENT_AMBULATORY_CARE_PROVIDER_SITE_OTHER): Payer: BC Managed Care – PPO | Admitting: Internal Medicine

## 2012-10-02 VITALS — BP 98/68 | HR 59 | Temp 98.8°F | Resp 16 | Ht 70.25 in | Wt 198.0 lb

## 2012-10-02 DIAGNOSIS — E785 Hyperlipidemia, unspecified: Secondary | ICD-10-CM | POA: Insufficient documentation

## 2012-10-02 DIAGNOSIS — G44009 Cluster headache syndrome, unspecified, not intractable: Secondary | ICD-10-CM

## 2012-10-02 DIAGNOSIS — Z0000 Encounter for general adult medical examination without abnormal findings: Secondary | ICD-10-CM

## 2012-10-02 LAB — CBC WITH DIFFERENTIAL/PLATELET
Basophils Absolute: 0 10*3/uL (ref 0.0–0.1)
Basophils Relative: 1 % (ref 0–1)
Eosinophils Absolute: 0.1 10*3/uL (ref 0.0–0.7)
HCT: 42 % (ref 39.0–52.0)
MCH: 31 pg (ref 26.0–34.0)
MCHC: 34.3 g/dL (ref 30.0–36.0)
Monocytes Absolute: 0.5 10*3/uL (ref 0.1–1.0)
Neutro Abs: 2.8 10*3/uL (ref 1.7–7.7)
Neutrophils Relative %: 58 % (ref 43–77)
RDW: 14 % (ref 11.5–15.5)

## 2012-10-02 MED ORDER — FLUOXETINE HCL 40 MG PO CAPS: 40.0000 mg | ORAL_CAPSULE | Freq: Every day | ORAL | Status: DC

## 2012-10-02 NOTE — Progress Notes (Signed)
Subjective:    Patient ID: Allen Burnett, male    DOB: 1950/11/24, 62 y.o.   MRN: [id number]  [facility] F/u  Patient Active Problem List   Diagnosis Date Noted  . Other and unspecified hyperlipidemia 10/02/2012  . Prostate cancer 11/08/2011    -  Cluster HAs- dr adelman/now controlled-1 q 18ms resp to im imitrex  son dismissed from [employer] for pos test now at home creating great stress for his parents --must complete a drug treatment program before he can  Be rehired  Doing well in general/status post prostate cancer treatment last November with improved PSA Exercising 5 times a week Sleep okay Colonoscopy up to date Immunizations up to date  Current outpatient prescriptions:diphenhydramine-acetaminophen (TYLENOL PM) 25-500 MG TABS, Take 1 tablet by mouth at bedtime as needed.  FLUoxetine (PROZAC) 40 MG capsule, Take 1 capsule (40 mg total) by mouth daily. Takes 2-3x week for cluster headaches, lansoprazole (PREVACID) 30 MG capsule, Take 30 mg by mouth as needed.,  Melatonin 5 MG TABS, Take 2 tablets by mouth at bedtime as needed. rosuvastatin (CRESTOR) 5 MG tablet, Take 5 mg by mouth daily Tamsulosin HCl (FLOMAX) 0.4 MG CAPS post urological procedure, Take 1 capsule (0.4 mg total) by mouth 2 (two) times daily  fish oil-omega-3 fatty acids 1000 MG capsule, Take 1 g by mouth daily. ;  SUMAtriptan (IMITREX) 6 MG/0.5ML SOLN injection, Inject 6 mg into the skin as needed.   Review of Systems  Constitutional: Negative.   HENT: Negative.   Eyes: Negative.   Respiratory: Negative.   Cardiovascular: Negative.   Gastrointestinal: Negative.   Endocrine: Negative.   Genitourinary: Negative.   Skin: Negative.   Allergic/Immunologic: Negative.   Neurological: Negative.   Hematological: Negative.   Psychiatric/Behavioral: Negative.        Objective:   Physical Exam  Constitutional: He is oriented to person, place, and time. He appears well-developed and well-nourished.  HENT:   Head: Normocephalic.  Right Ear: External ear normal.  Left Ear: External ear normal.  Nose: Nose normal.  Mouth/Throat: Oropharynx is clear and moist.  Eyes: Conjunctivae and EOM are normal. Pupils are equal, round, and reactive to light.  Neck: Normal range of motion. Neck supple. No thyromegaly present.  Cardiovascular: Normal rate, regular rhythm, normal heart sounds and intact distal pulses.   No murmur heard. Pulmonary/Chest: Effort normal and breath sounds normal. He has no wheezes.  Abdominal: Soft. Bowel sounds are normal. There is no tenderness. There is no guarding.  Musculoskeletal: Normal range of motion. He exhibits no edema and no tenderness.  Lymphadenopathy:    He has no cervical adenopathy.  Neurological: He is alert and oriented to person, place, and time. He has normal reflexes. He displays normal reflexes. No cranial nerve deficit. He exhibits normal muscle tone. Coordination normal.  Skin: No rash noted.  Psychiatric: He has a normal mood and affect. His behavior is normal. Judgment and thought content normal.          Assessment & Plan:  Annual physical exam  Other and unspecified hyperlipidemia  Cluster headache  Hx prostate Ca  Meds ordered this encounter  Medications  . rosuvastatin (CRESTOR) 5 MG tablet    Sig: Take 5 mg by mouth daily.  Marland Kitchen FLUoxetine (PROZAC) 40 MG capsule    Sig: Take 1 capsule (40 mg total) by mouth daily. Takes 2-3x week for cluster headaches    Dispense:  90 capsule    Refill:  3   May  call for imitrex/others  F/u 1 yr

## 2012-10-03 LAB — LIPID PANEL
HDL: 62 mg/dL (ref >39)
LDL Cholesterol: 100 mg/dL — ABNORMAL HIGH (ref 0–99)
Total CHOL/HDL Ratio: 2.9 Ratio
Triglycerides: 86 mg/dL (ref <150)
VLDL: 17 mg/dL (ref 0–40)

## 2012-10-03 LAB — COMPREHENSIVE METABOLIC PANEL
AST: 22 U/L (ref 0–37)
Albumin: 4.3 g/dL (ref 3.5–5.2)
Alkaline Phosphatase: 49 U/L (ref 39–117)
BUN: 21 mg/dL (ref 6–23)
Creat: 1.04 mg/dL (ref 0.50–1.35)
Potassium: 4.4 mEq/L (ref 3.5–5.3)
Total Bilirubin: 0.6 mg/dL (ref 0.3–1.2)

## 2012-10-04 DIAGNOSIS — G44009 Cluster headache syndrome, unspecified, not intractable: Secondary | ICD-10-CM | POA: Insufficient documentation

## 2012-10-06 ENCOUNTER — Encounter: Payer: Self-pay | Admitting: Internal Medicine

## 2013-08-06 ENCOUNTER — Encounter: Payer: Self-pay | Admitting: Emergency Medicine

## 2013-08-06 ENCOUNTER — Ambulatory Visit (INDEPENDENT_AMBULATORY_CARE_PROVIDER_SITE_OTHER): Payer: BC Managed Care – PPO | Admitting: Emergency Medicine

## 2013-08-06 VITALS — BP 118/55 | HR 43 | Temp 99.0°F | Resp 16 | Ht 70.0 in | Wt 202.2 lb

## 2013-08-06 DIAGNOSIS — J0100 Acute maxillary sinusitis, unspecified: Secondary | ICD-10-CM

## 2013-08-06 DIAGNOSIS — I493 Ventricular premature depolarization: Secondary | ICD-10-CM

## 2013-08-06 DIAGNOSIS — F411 Generalized anxiety disorder: Secondary | ICD-10-CM

## 2013-08-06 DIAGNOSIS — F418 Other specified anxiety disorders: Secondary | ICD-10-CM

## 2013-08-06 DIAGNOSIS — I4949 Other premature depolarization: Secondary | ICD-10-CM

## 2013-08-06 MED ORDER — ALPRAZOLAM 0.5 MG PO TABS: ORAL_TABLET | ORAL | Status: DC

## 2013-08-06 MED ORDER — AMOXICILLIN 875 MG PO TABS: 875.0000 mg | ORAL_TABLET | Freq: Two times a day (BID) | ORAL | Status: DC

## 2013-08-06 NOTE — Progress Notes (Signed)
   Subjective:    Patient ID: Allen Burnett, male    DOB: May 19, 1950, 63 y.o.   MRN: [id number]  Sinusitis   patient came to the office with a chief complaint of a sinus infection. At triage he was noted to have a slow heart rate so an EKG was ordered prior to being seen. His primary problem is that he has had a cold the last 2 weeks and for the last 24 ours has had significant pain in the left maxillary area associated with purulence. He has a mild sore throat some dry cough no fever or chills    Review of Systems  Cardiovascular:       He denies chest pain or shortness of breath. He has had no presyncopal episodes. 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 He has no history of cardiac disease. He is a regular exerciser. He denies any associated chest pain or shortness of breath with exercise.       Objective:   Physical Exam patient is alert and cooperative he is in no distress. There is nasal congestion on the left. The posterior pharynx is red. Chest was clear to auscultation and percussion. Cardiac exam revealed an irregular rhythm with a skipped beat every third to fourth beat. I did have him do 20 seconds the pushups but the PVCs were persistent  EKG computer reading says possible previous septal infarct. He does have an unusual complex in V2 but no definite evidence of a previous MI. He alternates between trigeminy and quadrigeminy. The PVCs all appear unifocal      Assessment & Plan:  Referral made to cardiology for evaluation. He was given copies of his tracings. He was given information about PVCs. We'll treat his sinus infection with amoxicillin . He is under an incredible amount of stress at work. He did him a limited number of Xanax to help with the stress he is going through right now. He will let me know how does with this.

## 2013-08-06 NOTE — Patient Instructions (Addendum)
Premature Beats A premature beat is an extra heartbeat that happens earlier than normal. Premature beats are called premature atrial contractions (PACs) or premature ventricular contractions (PVCs) depending on the area of the heart where they start. CAUSES  Premature beats may be brought on by a variety of factors including:  Emotional stress.  Lack of sleep.  Caffeine.  Asthma medicines.  Stimulants.  Herbal teas.  Dietary supplements.  Alcohol. In most cases, premature beats are not dangerous and are not a sign of serious heart disease. Most patients evaluated for premature beats have completely normal heart function. Rarely, premature beats may be a sign of more significant heart problems or medical illness. SYMPTOMS  Premature beats may cause palpitations. This means you feel like your heart is skipping a beat or beating harder than usual. Sometimes, slight chest pain occurs with premature beats, lasting only a few seconds. This pain has been described as a "flopping" feeling inside the chest. In many cases, premature beats do not cause any symptoms and they are only detected when an electrocardiography test (EKG) or heart monitoring is performed. DIAGNOSIS  Your caregiver may run some tests to evaluate your heart such as an EKG or echocardiography. You may need to wear a portable heart monitor for several days to record the electrical activity of your heart. Blood testing may also be performed to check your electrolytes and thyroid function. TREATMENT  Premature beats usually go away with rest. If the problem continues, your caregiver will determine a treatment plan for you.  HOME CARE INSTRUCTIONS  Get plenty of rest over the next few days until your symptoms improve.  Avoid coffee, tea, alcohol, and soda (pop, cola).  Do not smoke. SEEK MEDICAL CARE IF:  Your symptoms continue after 1 to 2 days of rest.  You have new symptoms, such as chest pain or trouble  breathing. SEEK IMMEDIATE MEDICAL CARE IF:  You have severe chest pain or abdominal pain.  You have pain that radiates into the neck, arm, or jaw.  You faint or have extreme weakness.  You have shortness of breath.  Your heartbeat races for more than 5 seconds. MAKE SURE YOU:  Understand these instructions.  Will watch your condition.  Will get help right away if you are not doing well or get worse. Document Released: 03/02/2004 Document Revised: 04/17/2011 Document Reviewed: 09/26/2010 [facility] Patient Information 2015 [place], [place]. This information is not intended to replace advice given to you by your health care provider. Make sure you discuss any questions you have with your health care provider. Premature Ventricular Contraction Premature ventricular contraction (PVC) is an irregularity of the heart rhythm involving extra or skipped heartbeats. In some cases, they may occur without obvious cause or heart disease. Other times, they can be caused by an electrolyte change in the blood. These need to be corrected. They can also be seen when there is not enough oxygen going to the heart. A common cause of this is plaque or cholesterol buildup. This buildup decreases the blood supply to the heart. In addition, extra beats may be caused or aggravated by: Excessive smoking. Alcohol consumption. Caffeine. Certain medications Some street drugs. SYMPTOMS  The sensation of feeling your heart skipping a beat (palpitations). In many cases, the person may have no symptoms. SIGNS AND TESTS  A physical examination may show an occasional irregularity, but if the PVC beats do not happen often, they may not be found on physical exam. Blood pressure is usually normal. Other tests  that may find extra beats of the heart are: An EKG (electrocardiogram) A Holter monitor which can monitor your heart over longer periods of time An Angiogram (study of the heart arteries). TREATMENT  Usually  extra heartbeats do not need treatment. The condition is treated only if symptoms are severe or if extra beats are very frequent or are causing problems. An underlying cause, if discovered, may also require treatment.  Treatment may also be needed if there may be a risk for other more serious cardiac arrhythmias.  PREVENTION  Moderation in caffeine, alcohol, and tobacco use may reduce the risk of ectopic heartbeats in some people. Exercise often helps people who lead a sedentary (inactive) lifestyle. PROGNOSIS  PVC heartbeats are generally harmless and do not need treatment.  RISKS AND COMPLICATIONS  Ventricular tachycardia (occasionally). There usually are no complications. Other arrhythmias (occasionally). SEEK IMMEDIATE MEDICAL CARE IF:  You feel palpitations that are frequent or continual. You develop chest pain or other problems such as shortness of breath, sweating, or nausea and vomiting. You become light-headed or faint (pass out). You get worse or do not improve with treatment. Document Released: 09/10/2003 Document Revised: 04/17/2011 Document Reviewed: 03/22/2007 [facility] Patient Information 2015 [place], [place]. This information is not intended to replace advice given to you by your health care provider. Make sure you discuss any questions you have with your health care provider.

## 2013-09-11 ENCOUNTER — Other Ambulatory Visit: Payer: Self-pay | Admitting: Emergency Medicine

## 2013-09-12 ENCOUNTER — Other Ambulatory Visit: Payer: Self-pay | Admitting: Emergency Medicine

## 2013-09-13 NOTE — Telephone Encounter (Signed)
Faxed

## 2013-10-01 DIAGNOSIS — C44320 Squamous cell carcinoma of skin of unspecified parts of face: Secondary | ICD-10-CM

## 2013-10-01 HISTORY — DX: Squamous cell carcinoma of skin of unspecified parts of face: C44.320

## 2013-10-29 ENCOUNTER — Ambulatory Visit (INDEPENDENT_AMBULATORY_CARE_PROVIDER_SITE_OTHER): Payer: BC Managed Care – PPO | Admitting: Internal Medicine

## 2013-10-29 ENCOUNTER — Other Ambulatory Visit: Payer: Self-pay | Admitting: Emergency Medicine

## 2013-10-29 ENCOUNTER — Encounter: Payer: Self-pay | Admitting: Internal Medicine

## 2013-10-29 VITALS — BP 100/72 | HR 87 | Temp 97.9°F | Resp 16 | Ht 70.5 in | Wt 200.0 lb

## 2013-10-29 DIAGNOSIS — Z0000 Encounter for general adult medical examination without abnormal findings: Secondary | ICD-10-CM

## 2013-10-29 DIAGNOSIS — C61 Malignant neoplasm of prostate: Secondary | ICD-10-CM

## 2013-10-29 DIAGNOSIS — C439 Malignant melanoma of skin, unspecified: Secondary | ICD-10-CM | POA: Insufficient documentation

## 2013-10-29 DIAGNOSIS — B351 Tinea unguium: Secondary | ICD-10-CM

## 2013-10-29 DIAGNOSIS — G44019 Episodic cluster headache, not intractable: Secondary | ICD-10-CM

## 2013-10-29 DIAGNOSIS — G4700 Insomnia, unspecified: Secondary | ICD-10-CM | POA: Insufficient documentation

## 2013-10-29 DIAGNOSIS — M722 Plantar fascial fibromatosis: Secondary | ICD-10-CM

## 2013-10-29 DIAGNOSIS — E785 Hyperlipidemia, unspecified: Secondary | ICD-10-CM

## 2013-10-29 DIAGNOSIS — Z23 Encounter for immunization: Secondary | ICD-10-CM

## 2013-10-29 LAB — CBC WITH DIFFERENTIAL/PLATELET
Basophils Absolute: 0.1 10*3/uL (ref 0.0–0.1)
Basophils Relative: 1 % (ref 0–1)
EOS ABS: 0.1 10*3/uL (ref 0.0–0.7)
EOS PCT: 2 % (ref 0–5)
HEMATOCRIT: 43.6 % (ref 39.0–52.0)
HEMOGLOBIN: 14.9 g/dL (ref 13.0–17.0)
LYMPHS ABS: 1.7 10*3/uL (ref 0.7–4.0)
Lymphocytes Relative: 25 % (ref 12–46)
MCH: 31 pg (ref 26.0–34.0)
MCHC: 34.2 g/dL (ref 30.0–36.0)
MCV: 90.8 fL (ref 78.0–100.0)
MONO ABS: 0.6 10*3/uL (ref 0.1–1.0)
MONOS PCT: 9 % (ref 3–12)
Neutro Abs: 4.3 10*3/uL (ref 1.7–7.7)
Neutrophils Relative %: 63 % (ref 43–77)
Platelets: 155 10*3/uL (ref 150–400)
RBC: 4.8 MIL/uL (ref 4.22–5.81)
RDW: 13.9 % (ref 11.5–15.5)
WBC: 6.8 10*3/uL (ref 4.0–10.5)

## 2013-10-29 MED ORDER — ALPRAZOLAM 0.5 MG PO TABS: ORAL_TABLET | ORAL | Status: DC

## 2013-10-29 MED ORDER — TERBINAFINE HCL 250 MG PO TABS: 250.0000 mg | ORAL_TABLET | Freq: Every day | ORAL | Status: DC

## 2013-10-29 NOTE — Progress Notes (Addendum)
Subjective:    Patient ID: Allen Burnett, male    DOB: 1950/12/12, 63 y.o.   MRN: [id number]  This chart was scribed for Leandrew Koyanagi, MD by Edison Simon, ED Scribe. This patient was seen in room 25 and the patient's care was started at 11:48 AM.   HPI  HPI Comments: Allen Burnett is a 63 y.o. male who presents to the [facility] for physical exam. He states a dermatologist diagnosed melanoma to his face 3 weeks ago, he had a lesion removed yesterday and has more to be treated in the near future. He reports plantar fasciitis to his right foot; he states he sleeps with a boot for it. He also reports pain to his lateral left lower extremity. He states he was working out a lot, but states pain has decreased since he has cut back on working out. He also reports fungal infection to his left foot. He states his last tetanus was in 2005. He states he is sleeping fine and uses melatonin and Xanax as needed. He reports a prior EKG with Dr. Everlene Farrier that showed some early beats but he did not see the cardiologist as planned. He states his last colonoscopy was in 2008 and is due for his next in 2018.  He states his daughter, who has moved to [place], is pregnant and is due in January. He states his son is still struggling; he was in a halfway house in [place] but was made to leave due to a DWI. 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 He states he is not sure yet what he wants to do with respect to his occupation.  Patient Active Problem List   Diagnosis Date Noted  . PVC (premature ventricular contraction) 08/06/2013  . Cluster headache 10/04/2012  . Other and unspecified hyperlipidemia 10/02/2012  . Prostate cancer 11/08/2011      Medication List       This list is accurate as of: 10/29/13 11:08 AM.  Always use your most recent med list.               ALPRAZolam 0.5 MG tablet  Commonly known as:  XANAX  take 1/2 to 1 tablet by mouth every 6  hours if needed     amoxicillin 875 MG tablet  Commonly known as:  AMOXIL  Take 1 tablet (875 mg total) by mouth 2 (two) times daily.     diphenhydramine-acetaminophen 25-500 MG Tabs  Commonly known as:  TYLENOL PM  Take 1 tablet by mouth at bedtime as needed.     hydrocodone-acetaminophen 5-500 MG per capsule  Commonly known as:  LORCET-HD  Take 1 capsule by mouth every 4 (four) hours as needed for pain.     lansoprazole 30 MG capsule  Commonly known as:  PREVACID  Take 30 mg by mouth as needed.     Melatonin 5 MG Tabs  Take 2 tablets by mouth at bedtime as needed.     rosuvastatin 5 MG tablet  Commonly known as:  CRESTOR  Take 5 mg by mouth daily.     SUMAtriptan 6 MG/0.5ML Soln injection  Commonly known as:  IMITREX  Inject 6 mg into the skin as needed. F         Review of Systems  Musculoskeletal:       Right foot plantar fasciitis, Pain to lateral side of left lower extremity  Skin: Positive for wound (surgical wound to  face).       Fungal infection to left foot, melanomas followed by dermatologist  Psychiatric/Behavioral: Negative for sleep disturbance.       Objective:   Physical Exam  Nursing note and vitals reviewed. Constitutional: He is oriented to person, place, and time. He appears well-developed and well-nourished.  HENT:  Head: Normocephalic and atraumatic.  Right Ear: External ear normal.  Left Ear: External ear normal.  Nose: Nose normal.  Mouth/Throat: Oropharynx is clear and moist.  Tms and canals clear  Eyes: Conjunctivae and EOM are normal. Pupils are equal, round, and reactive to light.  Neck: Normal range of motion. Neck supple. No thyromegaly present.  Cardiovascular: Normal rate, regular rhythm, normal heart sounds and intact distal pulses.   No murmur heard. No early beats detected  Pulmonary/Chest: Effort normal and breath sounds normal. No respiratory distress. He has no wheezes. He has no rales.  Abdominal: Soft. Bowel sounds are  normal. He exhibits no distension and no mass. There is no tenderness. There is no rebound and no guarding.  No hepatosplenomegaly  Musculoskeletal: Normal range of motion. He exhibits no edema.  Very tend at PF ins heel R--good DF no sw  Lymphadenopathy:    He has no cervical adenopathy.  Neurological: He is alert and oriented to person, place, and time. He has normal reflexes. No cranial nerve deficit. He exhibits normal muscle tone. Coordination normal.  Skin: Skin is warm and dry. No rash noted.  Suture line L malar fr Mel surg   Psychiatric: He has a normal mood and affect. His behavior is normal. Judgment and thought content normal.          Assessment & Plan:   I have completed the patient encounter in its entirety as documented by the scribe, with editing by me where necessary. Robert P. Merla Riches, M.D. Routine general medical examination at a health care facility  Onychomycosis of toenail  Plantar fasciitis of right foot  Insomnia  Melanoma of skin, site unspecified  Episodic cluster headache, not intractable  Prostate cancer - Plan: PSA, POCT urinalysis dipstick  Other and unspecified hyperlipidemia - Plan: CBC with Differential, Comprehensive metabolic panel, Lipid panel  Need for prophylactic vaccination against Streptococcus pneumoniae (pneumococcus) - Plan: Pneumococcal conjugate vaccine 13-valent IM  Meds ordered this encounter  Medications  . terbinafine (LAMISIL) 250 MG tablet    Sig: Take 1 tablet (250 mg total) by mouth daily.    Dispense:  90 tablet    Refill:  0  . ALPRAZolam (XANAX) 0.5 MG tablet    Sig: One at hs if needed    Dispense:  30 tablet    Refill:  5    Addend Results for orders placed in visit on 10/29/13  CBC WITH DIFFERENTIAL      Result Value Ref Range   WBC 6.8  4.0 - 10.5 K/uL   RBC 4.80  4.22 - 5.81 MIL/uL   Hemoglobin 14.9  13.0 - 17.0 g/dL   HCT 75.3  39.1 - 79.2 %   MCV 90.8  78.0 - 100.0 fL   MCH 31.0  26.0 - 34.0  pg   MCHC 34.2  30.0 - 36.0 g/dL   RDW 17.8  37.5 - 42.3 %   Platelets 155  150 - 400 K/uL   Neutrophils Relative % 63  43 - 77 %   Neutro Abs 4.3  1.7 - 7.7 K/uL   Lymphocytes Relative 25  12 - 46 %   Lymphs  Abs 1.7  0.7 - 4.0 K/uL   Monocytes Relative 9  3 - 12 %   Monocytes Absolute 0.6  0.1 - 1.0 K/uL   Eosinophils Relative 2  0 - 5 %   Eosinophils Absolute 0.1  0.0 - 0.7 K/uL   Basophils Relative 1  0 - 1 %   Basophils Absolute 0.1  0.0 - 0.1 K/uL   Smear Review Criteria for review not met    COMPREHENSIVE METABOLIC PANEL      Result Value Ref Range   Sodium 139  135 - 145 mEq/L   Potassium 4.8  3.5 - 5.3 mEq/L   Chloride 106  96 - 112 mEq/L   CO2 24  19 - 32 mEq/L   Glucose, Bld 87  70 - 99 mg/dL   BUN 20  6 - 23 mg/dL   Creat 0.93  0.50 - 1.35 mg/dL   Total Bilirubin 0.6  0.2 - 1.2 mg/dL   Alkaline Phosphatase 46  39 - 117 U/L   AST 20  0 - 37 U/L   ALT 25  0 - 53 U/L   Total Protein 6.5  6.0 - 8.3 g/dL   Albumin 4.2  3.5 - 5.2 g/dL   Calcium 9.2  8.4 - 10.5 mg/dL  LIPID PANEL      Result Value Ref Range   Cholesterol 151  0 - 200 mg/dL   Triglycerides 119  <150 mg/dL   HDL 64  >39 mg/dL   Total CHOL/HDL Ratio 2.4     VLDL 24  0 - 40 mg/dL   LDL Cholesterol 63  0 - 99 mg/dL  PSA      Result Value Ref Range   PSA 0.54  <=4.00 ng/mL

## 2013-10-29 NOTE — Progress Notes (Signed)
   Subjective:    Patient ID: Allen Burnett, male    DOB: 28-Jan-1951, 63 y.o.   MRN: [id number]  HPI    Review of Systems  Constitutional: Negative.   HENT: Negative.   Eyes: Negative.   Respiratory: Negative.   Cardiovascular: Negative.   Gastrointestinal: Negative.   Endocrine: Negative.   Genitourinary: Negative.   Musculoskeletal: Negative.   Skin: Negative.   Allergic/Immunologic: Negative.   Neurological: Negative.   Hematological: Negative.   Psychiatric/Behavioral: Negative.        Objective:   Physical Exam        Assessment & Plan:  I have reviewed and agree with documentation. Robert P. Laney Pastor, M.D.

## 2013-10-30 LAB — COMPREHENSIVE METABOLIC PANEL
ALT: 25 U/L (ref 0–53)
AST: 20 U/L (ref 0–37)
Albumin: 4.2 g/dL (ref 3.5–5.2)
Alkaline Phosphatase: 46 U/L (ref 39–117)
BILIRUBIN TOTAL: 0.6 mg/dL (ref 0.2–1.2)
BUN: 20 mg/dL (ref 6–23)
CO2: 24 meq/L (ref 19–32)
CREATININE: 0.93 mg/dL (ref 0.50–1.35)
Calcium: 9.2 mg/dL (ref 8.4–10.5)
Chloride: 106 mEq/L (ref 96–112)
GLUCOSE: 87 mg/dL (ref 70–99)
Potassium: 4.8 mEq/L (ref 3.5–5.3)
SODIUM: 139 meq/L (ref 135–145)
TOTAL PROTEIN: 6.5 g/dL (ref 6.0–8.3)

## 2013-10-30 LAB — LIPID PANEL
CHOLESTEROL: 151 mg/dL (ref 0–200)
HDL: 64 mg/dL (ref >39)
LDL Cholesterol: 63 mg/dL (ref 0–99)
TRIGLYCERIDES: 119 mg/dL (ref <150)
Total CHOL/HDL Ratio: 2.4 Ratio
VLDL: 24 mg/dL (ref 0–40)

## 2013-10-30 LAB — PSA: PSA: 0.54 ng/mL (ref <=4.00)

## 2013-10-30 NOTE — Telephone Encounter (Signed)
Faxed in on 10/29/13 from 104 appt

## 2013-10-31 ENCOUNTER — Encounter: Payer: Self-pay | Admitting: Internal Medicine

## 2013-11-07 ENCOUNTER — Telehealth: Payer: Self-pay

## 2013-11-07 NOTE — Telephone Encounter (Signed)
Patient requesting to speak with a nurse or Dr. Laney Pastor if Possible. Per patient his PSA has went from 0.43 (50mths ago) to now at 0.54. Patient is concerned and has questions. Patient can be reached back at [phone] or [phone]

## 2013-11-07 NOTE — Telephone Encounter (Signed)
Lm for pt to rtn call- Slight increase has been noted. Results were sent to Urologist also.

## 2013-11-28 DIAGNOSIS — Z0283 Encounter for blood-alcohol and blood-drug test: Secondary | ICD-10-CM | POA: Insufficient documentation

## 2013-12-02 ENCOUNTER — Encounter: Payer: Self-pay | Admitting: Internal Medicine

## 2013-12-09 ENCOUNTER — Encounter: Payer: Self-pay | Admitting: Internal Medicine

## 2014-02-04 ENCOUNTER — Telehealth: Payer: Self-pay

## 2014-02-04 NOTE — Telephone Encounter (Signed)
Pt is calling to request a refill of prozac.

## 2014-02-04 NOTE — Telephone Encounter (Signed)
Do not show pt has not taken medication in over a year. Pt needs to be seen in the office.  Pt will come into clinic on Friday to see Dr. Laney Pastor.

## 2014-02-23 IMAGING — CR DG CHEST 2V
2 series · 2 of 2 positions shown · non-contrast
Comparison: None.

CLINICAL DATA: Preoperative respiratory exam for seed implantation
for prostate cancer.

CHEST - 2 VIEW

[w chest pa]
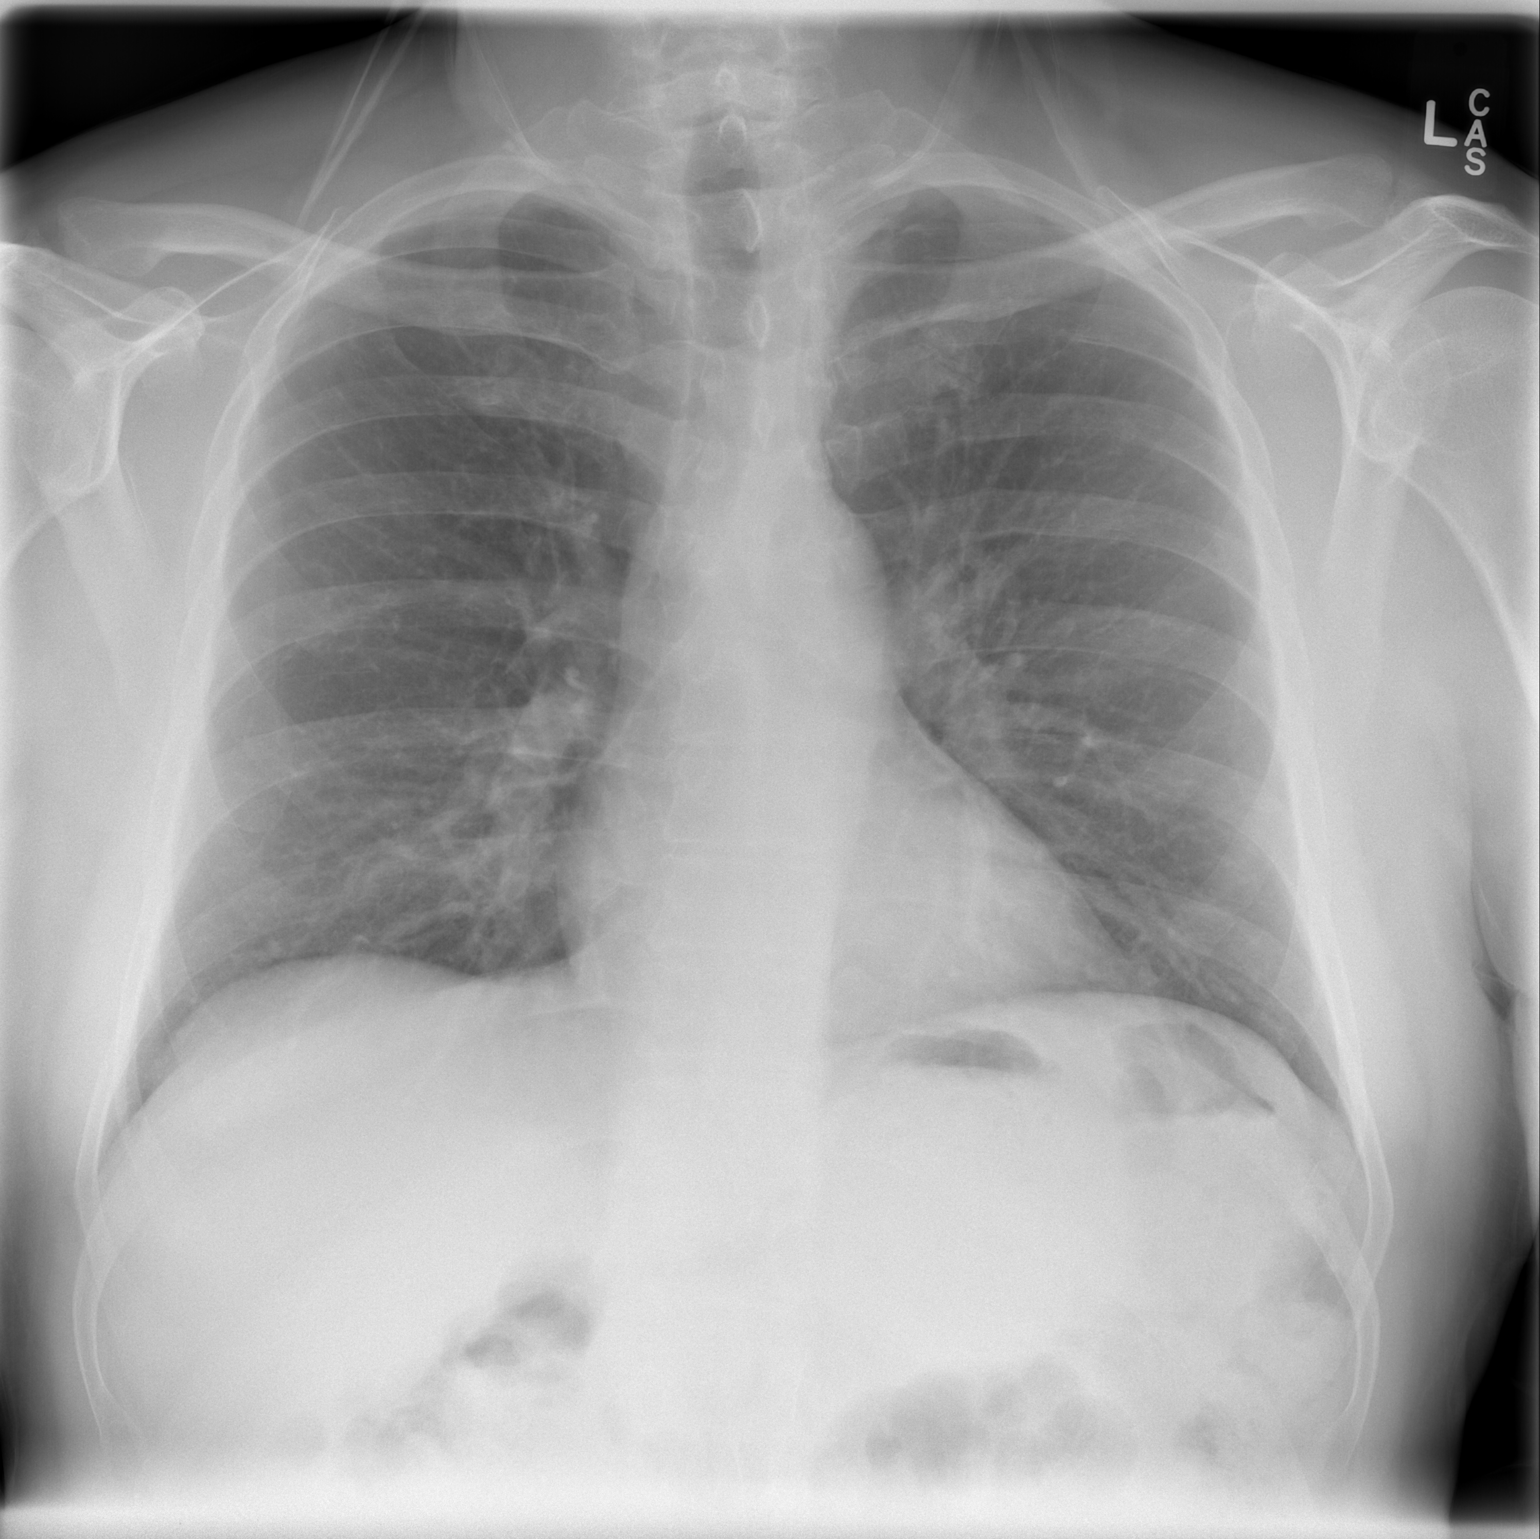

[w chest lat]
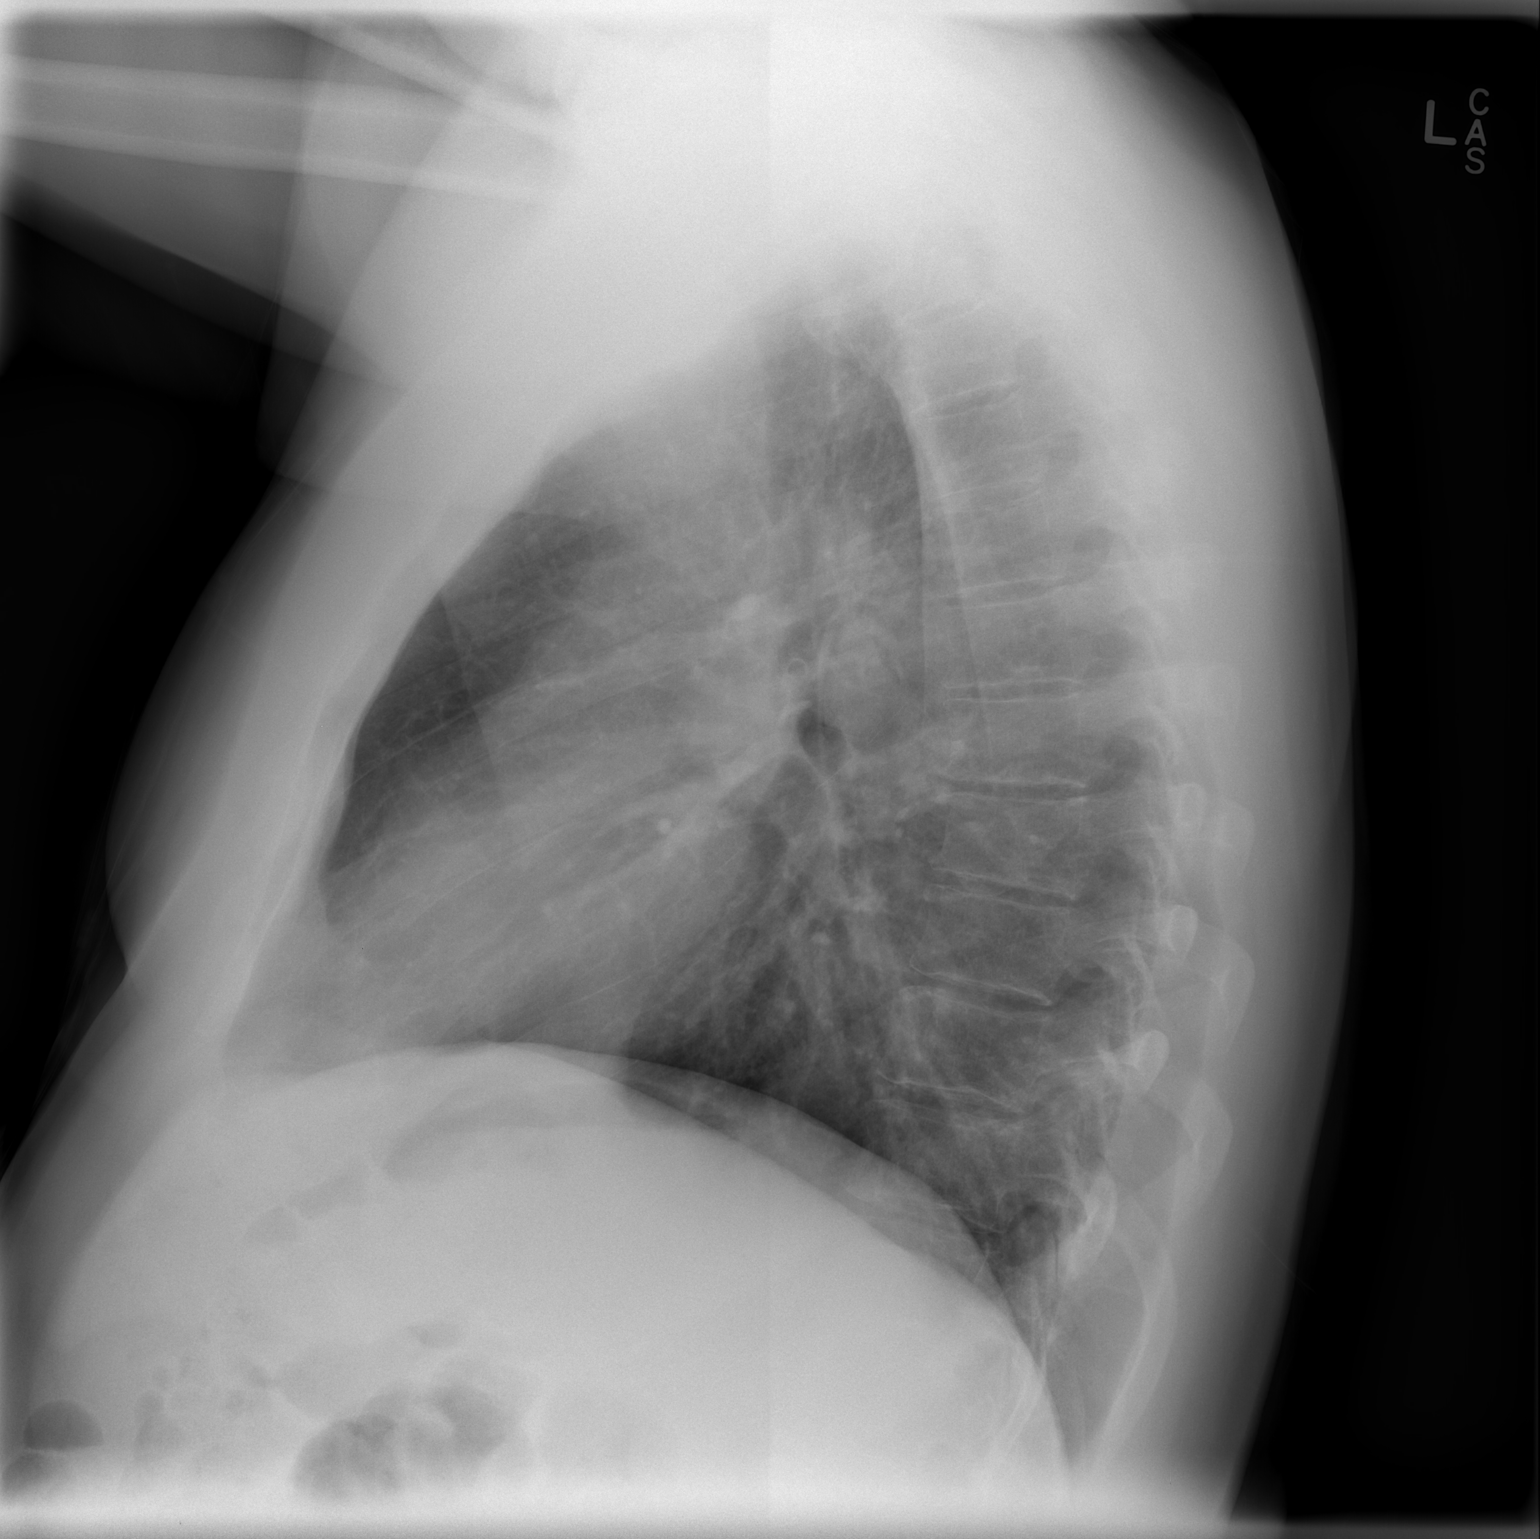

[2 of 2 positions shown; findings below may reference images not displayed]

FINDINGS: Heart size is normal.  Mediastinal shadows are normal.
Lungs are clear.  No effusions.  No bony abnormalities.
IMPRESSION: Normal chest.

## 2014-03-23 ENCOUNTER — Ambulatory Visit (INDEPENDENT_AMBULATORY_CARE_PROVIDER_SITE_OTHER): Payer: BLUE CROSS/BLUE SHIELD | Admitting: Emergency Medicine

## 2014-03-23 VITALS — BP 124/76 | HR 62 | Temp 97.6°F | Resp 17 | Ht 70.5 in | Wt 204.0 lb

## 2014-03-23 DIAGNOSIS — M79672 Pain in left foot: Secondary | ICD-10-CM | POA: Diagnosis not present

## 2014-03-23 DIAGNOSIS — L600 Ingrowing nail: Secondary | ICD-10-CM

## 2014-03-23 DIAGNOSIS — L603 Nail dystrophy: Secondary | ICD-10-CM

## 2014-03-23 NOTE — Progress Notes (Signed)
Subjective:  This chart was scribed for Nena Jordan MD, by Tamsen Roers, at [facility].  This patient was seen in room 9 and the patient's care was started at 11:27 AM.    Patient ID: Allen Burnett, male    DOB: [birth year], 64 y.o.   MRN: [id number]  HPI  HPI Comments: Allen Burnett is a 64 y.o. male who presents to [facility] for constant pain from an  ingrown toenail on his left big toe onset 2-3 weeks ago.Denies wearing any tight shoes and states that he has extreme tenderness to touch.   Patient takes anti fungals for his toe nails. Patient has no other complaints today.    Patient Active Problem List   Diagnosis Date Noted  . Insomnia 10/29/2013  . Melanoma of skin, site unspecified 10/29/2013  . PVC (premature ventricular contraction) 08/06/2013  . Cluster headache 10/04/2012  . Other and unspecified hyperlipidemia 10/02/2012  . Prostate cancer 11/08/2011   Past Medical History  Diagnosis Date  . PSA elevation     2009=3.4,2010=3.46,2011=3.16,2012=3.76,07/2011=4.85  . Prostate cancer DX  10/11/11    Adenocarcinoma,gleason 3+3=6,PSA=5.39   volume=30cc  . Acid reflux OCCASIONALLY--  TAKE PREVACID  . Chronic cluster headache FOLLOWED AT [place]    PRN OXYGEN VIA [place] WHEN FEELS HEADACHE STARTING/  ON PREVENTION MEDS  . Hx of radiation therapy 12/28/11    prostate seed implant/75 seeds/,Dr.Dahlstedt   Past Surgical History  Procedure Laterality Date  . Prostate biopsy  10/11/11    gleason 3+3=6,PSA=5.39,Adenocarcinoma  . Appendectomy  AGE 43  . Radioactive seed implant  12/28/2011    Procedure: RADIOACTIVE SEED IMPLANT;  Surgeon: Franchot Gallo, MD;  Location: [facility];  Service: Urology;  Laterality: N/A;  75 seeds implanted  no seeds found in bladder   No Known Allergies Prior to Admission medications   Medication Sig Start Date End Date Taking? Authorizing Provider  lansoprazole (PREVACID)  30 MG capsule Take 30 mg by mouth as needed.   Yes Historical Provider, MD  Melatonin 5 MG TABS Take 2 tablets by mouth at bedtime as needed.   Yes Historical Provider, MD   History   Social History  . Marital Status: Married    Spouse Name: N/A  . Number of Children: 2  . Years of Education: N/A   Occupational History  .      advertising sales   Social History Main Topics  . Smoking status: Former Smoker -- 1.00 packs/day for 10 years    Types: Cigarettes    Quit date: 07/24/1984  . Smokeless tobacco: Former Systems developer    Types: Chew  . Alcohol Use: Yes     Comment: beer on weekends  . Drug Use: No  . Sexual Activity: Not on file   Other Topics Concern  . Not on file   Social History Narrative    Review of Systems  Constitutional: Negative for fever and chills.  Respiratory: Negative for shortness of breath.   Cardiovascular: Negative for chest pain.  Skin: Positive for color change.       Objective:   Physical Exam CONSTITUTIONAL: Well developed/well nourished HEAD: Normocephalic/atraumatic EYES: EOMI/PERRL ENMT: Mucous membranes moist NECK: supple no meningeal signs SPINE/BACK:entire spine nontender CV: S1/S2 noted, no murmurs/rubs/gallops noted LUNGS: Lungs are clear to auscultation bilaterally, no apparent distress ABDOMEN: soft, nontender, no rebound or guarding, bowel sounds noted throughout abdomen GU:no cva tenderness NEURO: Pt is awake/alert/appropriate, moves all  extremitiesx4.  No facial droop.   EXTREMITIES: pulses normal/equal, full ROM, Dystrophic nail is present.  There is redness, swelling and tenderness medial soft tissue adjacent to the nail of the left  great toe.   SKIN: warm, color normal PSYCH: no abnormalities of mood noted, alert and oriented to situation  Filed Vitals:   03/23/14 1120  BP: 124/76  Pulse: 62  Temp: 97.6 F (36.4 C)  TempSrc: Oral  Resp: 17  Height: 5' 10.5" (1.791 m)  Weight: 204 lb (92.534 kg)  SpO2: 98%    Results for orders placed or performed in visit on 10/29/13  CBC with Differential  Result Value Ref Range   WBC 6.8 4.0 - 10.5 K/uL   RBC 4.80 4.22 - 5.81 MIL/uL   Hemoglobin 14.9 13.0 - 17.0 g/dL   HCT 43.6 39.0 - 52.0 %   MCV 90.8 78.0 - 100.0 fL   MCH 31.0 26.0 - 34.0 pg   MCHC 34.2 30.0 - 36.0 g/dL   RDW 13.9 11.5 - 15.5 %   Platelets 155 150 - 400 K/uL   Neutrophils Relative % 63 43 - 77 %   Neutro Abs 4.3 1.7 - 7.7 K/uL   Lymphocytes Relative 25 12 - 46 %   Lymphs Abs 1.7 0.7 - 4.0 K/uL   Monocytes Relative 9 3 - 12 %   Monocytes Absolute 0.6 0.1 - 1.0 K/uL   Eosinophils Relative 2 0 - 5 %   Eosinophils Absolute 0.1 0.0 - 0.7 K/uL   Basophils Relative 1 0 - 1 %   Basophils Absolute 0.1 0.0 - 0.1 K/uL   Smear Review Criteria for review not met   Comprehensive metabolic panel  Result Value Ref Range   Sodium 139 135 - 145 mEq/L   Potassium 4.8 3.5 - 5.3 mEq/L   Chloride 106 96 - 112 mEq/L   CO2 24 19 - 32 mEq/L   Glucose, Bld 87 70 - 99 mg/dL   BUN 20 6 - 23 mg/dL   Creat 0.93 0.50 - 1.35 mg/dL   Total Bilirubin 0.6 0.2 - 1.2 mg/dL   Alkaline Phosphatase 46 39 - 117 U/L   AST 20 0 - 37 U/L   ALT 25 0 - 53 U/L   Total Protein 6.5 6.0 - 8.3 g/dL   Albumin 4.2 3.5 - 5.2 g/dL   Calcium 9.2 8.4 - 10.5 mg/dL  Lipid panel  Result Value Ref Range   Cholesterol 151 0 - 200 mg/dL   Triglycerides 119 <150 mg/dL   HDL 64 >39 mg/dL   Total CHOL/HDL Ratio 2.4 Ratio   VLDL 24 0 - 40 mg/dL   LDL Cholesterol 63 0 - 99 mg/dL  PSA  Result Value Ref Range   PSA 0.54 <=4.00 ng/mL         Assessment & Plan:  The toe did not look infected. We'll remove one third of the nail and hopefully this will heal without antibiotics. No hyphae were seen on KOH prep I personally performed the services described in this documentation, which was scribed in my presence. The recorded information has been reviewed and is accurate.

## 2014-03-23 NOTE — Patient Instructions (Signed)
INGROWN TOENAIL . Keep area clean, dry and bandaged for 24 hours. . After 24 hours, remove outer bandage and leave yellow gauze in place. . Soak toe/foot in warm soapy water for 5-10 minutes, once daily for 5 days. Rebandage toe after each cleaning. . Continue soaks until yellow gauze falls off. . Notify the office if you experience any of the following signs of infection: Swelling, redness, pus drainage, streaking, fever > 101.0 F   

## 2014-03-23 NOTE — Progress Notes (Signed)
Verbal consent obtained. Digital block with 4 cc 2% lidocaine plain.  SP&D.  Medial nail lifted and removed.  Xeroform placed.  Cleansed and dressed.  Counseled on health maintenance items. He believes that he has had a tetanus booster in the past 10 years. Colonoscopy at age 64 was reportedly normal. Needs seasonal flu vaccine and Zostavax.

## 2014-05-06 ENCOUNTER — Other Ambulatory Visit: Payer: Self-pay | Admitting: Internal Medicine

## 2014-05-07 NOTE — Telephone Encounter (Signed)
Called in, and signed RF verbal w/readback.

## 2014-05-07 NOTE — Telephone Encounter (Signed)
Ok to call in

## 2014-10-23 ENCOUNTER — Encounter: Payer: BLUE CROSS/BLUE SHIELD | Admitting: Internal Medicine

## 2014-11-23 ENCOUNTER — Telehealth: Payer: Self-pay

## 2014-11-23 NOTE — Telephone Encounter (Signed)
Pt dropped off teacher pe form to be completed by Dr Dwana Melena upcoming appt with him   Best phone for pt is [phone]

## 2014-11-25 ENCOUNTER — Other Ambulatory Visit: Payer: Self-pay | Admitting: Internal Medicine

## 2014-11-25 NOTE — Telephone Encounter (Signed)
Pt came by to pick up form.  I didn't see it in the drawer.  After he left, I found it completed in the nurses box.  So I have called him to come back and pick it up

## 2014-11-27 NOTE — Telephone Encounter (Signed)
Faxed

## 2014-12-02 ENCOUNTER — Encounter: Payer: BLUE CROSS/BLUE SHIELD | Admitting: Internal Medicine

## 2014-12-04 ENCOUNTER — Encounter: Payer: BLUE CROSS/BLUE SHIELD | Admitting: Internal Medicine

## 2014-12-09 ENCOUNTER — Telehealth: Payer: Self-pay

## 2014-12-17 NOTE — Telephone Encounter (Signed)
No msg °

## 2014-12-18 ENCOUNTER — Encounter: Payer: BLUE CROSS/BLUE SHIELD | Admitting: Internal Medicine

## 2014-12-25 ENCOUNTER — Encounter: Payer: BLUE CROSS/BLUE SHIELD | Admitting: Internal Medicine

## 2015-01-04 ENCOUNTER — Encounter: Payer: Self-pay | Admitting: Internal Medicine

## 2015-01-12 ENCOUNTER — Telehealth: Payer: Self-pay

## 2015-01-12 DIAGNOSIS — Z0000 Encounter for general adult medical examination without abnormal findings: Secondary | ICD-10-CM

## 2015-01-12 NOTE — Telephone Encounter (Signed)
The patient is requesting to get his labwork for his physical sometime this week.  Could the orders be placed, so that he can come in one morning this week to get his labwork done.  He wants to have the results available for his CPE on 12/14.  Please advise, thank you.  CB#: [phone]

## 2015-01-13 NOTE — Telephone Encounter (Signed)
Ok to order cbc cmet lipids psa

## 2015-01-14 NOTE — Telephone Encounter (Signed)
Order placed. Called pt, Unable to leave VM

## 2015-01-18 NOTE — Telephone Encounter (Signed)
Left voicemail for pt to come to get labs.

## 2015-01-20 ENCOUNTER — Encounter: Payer: Self-pay | Admitting: Internal Medicine

## 2015-01-20 ENCOUNTER — Ambulatory Visit (INDEPENDENT_AMBULATORY_CARE_PROVIDER_SITE_OTHER): Payer: BC Managed Care – PPO | Admitting: Internal Medicine

## 2015-01-20 VITALS — BP 121/77 | HR 71 | Temp 97.4°F | Resp 16 | Ht 70.0 in | Wt 197.0 lb

## 2015-01-20 DIAGNOSIS — Z1159 Encounter for screening for other viral diseases: Secondary | ICD-10-CM | POA: Diagnosis not present

## 2015-01-20 DIAGNOSIS — Z0000 Encounter for general adult medical examination without abnormal findings: Secondary | ICD-10-CM | POA: Diagnosis not present

## 2015-01-20 DIAGNOSIS — C61 Malignant neoplasm of prostate: Secondary | ICD-10-CM

## 2015-01-20 DIAGNOSIS — E785 Hyperlipidemia, unspecified: Secondary | ICD-10-CM

## 2015-01-20 NOTE — Progress Notes (Addendum)
Subjective:    Patient ID: Allen Burnett, male    DOB: Jul 04, 1950, 64 y.o.   MRN: [id number]  HPIcpe Retired/doing well/feels great See hx -elev PSA at last Exam--13 Dr. Diona Fanti. Adenocarcinoma prostate, clinical stage T1 C., Gleason 3+3. 7/12 biopsies were positive. Prostate size was just at 30 cc.//seeds doing weell   Colo at 56 so waiting til 19 Recently had immuniz so will pull old chart  Review of Systems 14pt per form=all neg x mild freq post treatment   has a few areas of tendonitis, soreness--R Hip after golf Objective:   Physical Exam  Constitutional: He is oriented to person, place, and time. He appears well-developed and well-nourished.  HENT:  Head: Normocephalic and atraumatic.  Right Ear: Hearing, tympanic membrane, external ear and ear canal normal.  Left Ear: Hearing, tympanic membrane, external ear and ear canal normal.  Nose: Nose normal.  Mouth/Throat: Uvula is midline, oropharynx is clear and moist and mucous membranes are normal.  Eyes: Conjunctivae, EOM and lids are normal. Pupils are equal, round, and reactive to light. Right eye exhibits no discharge. Left eye exhibits no discharge. No scleral icterus.  Neck: Trachea normal and normal range of motion. Neck supple. Carotid bruit is not present.  Cardiovascular: Normal rate, regular rhythm, normal heart sounds, intact distal pulses and normal pulses.   No murmur heard. Pulmonary/Chest: Effort normal and breath sounds normal. No respiratory distress. He has no wheezes. He has no rhonchi. He has no rales.  Abdominal: Soft. Normal appearance and bowel sounds are normal. He exhibits no abdominal bruit. There is no tenderness.  Musculoskeletal: Normal range of motion. He exhibits no edema or tenderness.  Lymphadenopathy:       Head (right side): No submental, no submandibular, no tonsillar, no preauricular, no posterior auricular and no occipital adenopathy present.       Head (left side): No submental, no  submandibular, no tonsillar, no preauricular, no posterior auricular and no occipital adenopathy present.    He has no cervical adenopathy.  Neurological: He is alert and oriented to person, place, and time. He has normal strength and normal reflexes. No cranial nerve deficit or sensory deficit. Coordination and gait normal.  Skin: Skin is warm, dry and intact. No lesion and no rash noted.  Psychiatric: He has a normal mood and affect. His speech is normal and behavior is normal. Judgment and thought content normal.  BP 121/77 mmHg  Pulse 71  Temp(Src) 97.4 F (36.3 C) (Oral)  Resp 16  Ht '5\' 10"'$  (1.778 m)  Wt 197 lb (89.359 kg)  BMI 28.27 kg/m2  SpO2 97%         Assessment & Plan:  Prostate cancer (HCC)s/p seeds - Plan: PSA  Hyperlipidemia - Plan: diet much better--off meds-- Lipid panel  Need for hepatitis C screening test - Plan: Hepatitis C antibody  Annual physical exam  Family stress--occas benzo to sleep(son mid twenties drifting in drug culture)    Call for alpraz if needed i'll call with labs Results for orders placed or performed in visit on 01/20/15  CBC with Differential/Platelet  Result Value Ref Range   WBC 5.4 4.0 - 10.5 K/uL   RBC 4.71 4.22 - 5.81 MIL/uL   Hemoglobin 14.9 13.0 - 17.0 g/dL   HCT 42.4 39.0 - 52.0 %   MCV 90.0 78.0 - 100.0 fL   MCH 31.6 26.0 - 34.0 pg   MCHC 35.1 30.0 - 36.0 g/dL   RDW 13.5 11.5 - 15.5 %  Platelets 180 150 - 400 K/uL   MPV 11.0 8.6 - 12.4 fL   Neutrophils Relative % 63 43 - 77 %   Neutro Abs 3.4 1.7 - 7.7 K/uL   Lymphocytes Relative 23 12 - 46 %   Lymphs Abs 1.2 0.7 - 4.0 K/uL   Monocytes Relative 11 3 - 12 %   Monocytes Absolute 0.6 0.1 - 1.0 K/uL   Eosinophils Relative 2 0 - 5 %   Eosinophils Absolute 0.1 0.0 - 0.7 K/uL   Basophils Relative 1 0 - 1 %   Basophils Absolute 0.1 0.0 - 0.1 K/uL   Smear Review Criteria for review not met   Comprehensive metabolic panel  Result Value Ref Range   Sodium 136 135 -  146 mmol/L   Potassium >7.5 (HH)---repeat wnl 3.5 - 5.3 mmol/L   Chloride 107 98 - 110 mmol/L   CO2 25 20 - 31 mmol/L   Glucose, Bld 66 65 - 99 mg/dL   BUN 21 7 - 25 mg/dL   Creat 0.99 0.70 - 1.25 mg/dL   Total Bilirubin 0.5 0.2 - 1.2 mg/dL   Alkaline Phosphatase 48 40 - 115 U/L   AST 21 10 - 35 U/L   ALT 22 9 - 46 U/L   Total Protein 6.5 6.1 - 8.1 g/dL   Albumin 4.2 3.6 - 5.1 g/dL   Calcium 9.0 8.6 - 10.3 mg/dL  Lipid panel  Result Value Ref Range   Cholesterol 212 (H) 125 - 200 mg/dL   Triglycerides 135 <150 mg/dL   HDL 73---good >=40 mg/dL   Total CHOL/HDL Ratio 2.9 <=5.0 Ratio   VLDL 27 <30 mg/dL   LDL Cholesterol 112 <130 mg/dL  PSA  Result Value Ref Range   PSA 0.22 <=4.00 ng/mL  Hepatitis C antibody  Result Value Ref Range   HCV Ab NEGATIVE NEGATIVE    Great results!!!!discussed

## 2015-01-21 ENCOUNTER — Telehealth: Payer: Self-pay

## 2015-01-21 ENCOUNTER — Other Ambulatory Visit: Payer: BC Managed Care – PPO

## 2015-01-21 DIAGNOSIS — E875 Hyperkalemia: Secondary | ICD-10-CM

## 2015-01-21 DIAGNOSIS — Z0000 Encounter for general adult medical examination without abnormal findings: Secondary | ICD-10-CM

## 2015-01-21 LAB — COMPREHENSIVE METABOLIC PANEL
ALBUMIN: 4.2 g/dL (ref 3.6–5.1)
ALK PHOS: 48 U/L (ref 40–115)
ALT: 22 U/L (ref 9–46)
AST: 21 U/L (ref 10–35)
BILIRUBIN TOTAL: 0.5 mg/dL (ref 0.2–1.2)
BUN: 21 mg/dL (ref 7–25)
CO2: 25 mmol/L (ref 20–31)
CREATININE: 0.99 mg/dL (ref 0.70–1.25)
Calcium: 9 mg/dL (ref 8.6–10.3)
Chloride: 107 mmol/L (ref 98–110)
Glucose, Bld: 66 mg/dL (ref 65–99)
Potassium: >7.5 mmol/L (ref 3.5–5.3)
SODIUM: 136 mmol/L (ref 135–146)
TOTAL PROTEIN: 6.5 g/dL (ref 6.1–8.1)

## 2015-01-21 LAB — CBC WITH DIFFERENTIAL/PLATELET
BASOS PCT: 1 % (ref 0–1)
Basophils Absolute: 0.1 10*3/uL (ref 0.0–0.1)
Eosinophils Absolute: 0.1 10*3/uL (ref 0.0–0.7)
Eosinophils Relative: 2 % (ref 0–5)
HEMATOCRIT: 42.4 % (ref 39.0–52.0)
HEMOGLOBIN: 14.9 g/dL (ref 13.0–17.0)
LYMPHS PCT: 23 % (ref 12–46)
Lymphs Abs: 1.2 10*3/uL (ref 0.7–4.0)
MCH: 31.6 pg (ref 26.0–34.0)
MCHC: 35.1 g/dL (ref 30.0–36.0)
MCV: 90 fL (ref 78.0–100.0)
MONOS PCT: 11 % (ref 3–12)
MPV: 11 fL (ref 8.6–12.4)
Monocytes Absolute: 0.6 10*3/uL (ref 0.1–1.0)
NEUTROS ABS: 3.4 10*3/uL (ref 1.7–7.7)
NEUTROS PCT: 63 % (ref 43–77)
Platelets: 180 10*3/uL (ref 150–400)
RBC: 4.71 MIL/uL (ref 4.22–5.81)
RDW: 13.5 % (ref 11.5–15.5)
WBC: 5.4 10*3/uL (ref 4.0–10.5)

## 2015-01-21 LAB — LIPID PANEL
CHOLESTEROL: 212 mg/dL — AB (ref 125–200)
HDL: 73 mg/dL (ref >=40)
LDL Cholesterol: 112 mg/dL (ref <130)
TRIGLYCERIDES: 135 mg/dL (ref <150)
Total CHOL/HDL Ratio: 2.9 Ratio (ref <=5.0)
VLDL: 27 mg/dL (ref <30)

## 2015-01-21 LAB — HEPATITIS C ANTIBODY: HCV Ab: NEGATIVE

## 2015-01-21 NOTE — Telephone Encounter (Signed)
Pt's potassium level is elevated. Investigating whether this is an error on our part vs [facility].  Pt will RTC for re collect today

## 2015-01-21 NOTE — Telephone Encounter (Signed)
He and Milda Smart both from wed clinic so had to be our error or their machine!!!

## 2015-01-22 ENCOUNTER — Telehealth: Payer: Self-pay | Admitting: Internal Medicine

## 2015-01-22 LAB — COMPREHENSIVE METABOLIC PANEL
ALT: 21 U/L (ref 9–46)
AST: 20 U/L (ref 10–35)
Albumin: 4.3 g/dL (ref 3.6–5.1)
Alkaline Phosphatase: 47 U/L (ref 40–115)
BUN: 21 mg/dL (ref 7–25)
CO2: 26 mmol/L (ref 20–31)
Calcium: 9.1 mg/dL (ref 8.6–10.3)
Chloride: 104 mmol/L (ref 98–110)
Creat: 0.98 mg/dL (ref 0.70–1.25)
Glucose, Bld: 94 mg/dL (ref 65–99)
Potassium: 4.5 mmol/L (ref 3.5–5.3)
Sodium: 141 mmol/L (ref 135–146)
Total Bilirubin: 0.6 mg/dL (ref 0.2–1.2)
Total Protein: 6.5 g/dL (ref 6.1–8.1)

## 2015-01-22 LAB — PSA: PSA: 0.22 ng/mL (ref <=4.00)

## 2015-01-22 NOTE — Telephone Encounter (Signed)
Still investigating. His repeat Potassium was 4.5

## 2015-01-22 NOTE — Telephone Encounter (Signed)
Called pt and let him know that his repeat K was normal. He said you just called and let him know about the rest of his labs

## 2015-01-24 NOTE — Telephone Encounter (Signed)
labs

## 2015-07-07 ENCOUNTER — Other Ambulatory Visit: Payer: Self-pay

## 2015-07-07 MED ORDER — ALPRAZOLAM 0.5 MG PO TABS: ORAL_TABLET | ORAL | Status: DC

## 2015-07-07 NOTE — Telephone Encounter (Signed)
Pharm reqs RF of alprazolam.

## 2015-07-08 NOTE — Telephone Encounter (Signed)
Faxed

## 2015-10-01 ENCOUNTER — Ambulatory Visit: Payer: Self-pay | Admitting: Family Medicine

## 2015-10-05 ENCOUNTER — Ambulatory Visit (INDEPENDENT_AMBULATORY_CARE_PROVIDER_SITE_OTHER): Payer: BC Managed Care – PPO | Admitting: Family Medicine

## 2015-10-05 ENCOUNTER — Encounter: Payer: Self-pay | Admitting: Family Medicine

## 2015-10-05 VITALS — BP 122/78 | Ht 72.0 in | Wt 190.0 lb

## 2015-10-05 DIAGNOSIS — M79604 Pain in right leg: Secondary | ICD-10-CM | POA: Diagnosis not present

## 2015-10-05 MED ORDER — GABAPENTIN 100 MG PO CAPS: 100.0000 mg | ORAL_CAPSULE | Freq: Every day | ORAL | 1 refills | Status: DC

## 2015-10-05 MED ORDER — MELOXICAM 15 MG PO TABS: 15.0000 mg | ORAL_TABLET | Freq: Every day | ORAL | 0 refills | Status: DC

## 2015-10-05 NOTE — Patient Instructions (Addendum)
Thank you for coming in,   The pain that is occurring in your buttock is possible for high hamstring syndrome.   The pain that runs down your leg could be a piriformis syndrome and we are sending in the gabapentin for that.   The pain on the outside of your leg is possible for a bursitis.   We are sending in Mobic which is an anti-inflammatory but you can start this two weeks from today to see how the gabapentin works.   Please follow up in three weeks and we'll see how your pain is doing.   We'll call you with the results of the x-rays.    Please feel free to call with any questions or concerns at any time, at [phone]. --Dr. Raeford Razor

## 2015-10-05 NOTE — Progress Notes (Signed)
  Allen Burnett - 65 y.o. male MRN OF:[id number]  Date of birth: 27-Jan-1951  SUBJECTIVE:  Including CC & ROS.  Chief Complaint  Patient presents with  . Hip Pain  . Leg Pain     Allen Burnett is a 65 year old male that is presenting with hip and leg pain. Has been ongoing for the past 6-7 months. He feels the pain in his right buttock. But he also feels pain in the lateral thigh that radiates distally. He also has pain in the anterior groin that radiates down his anterior thigh. He has perform stretching and rolling with minimal improvement. He reports that the pain started in a gradual progression. He feels the pain is staying the same. He feels like the symptoms occur mainly at night. He has not tried any medications. He has not done any formal physical therapy. Describes the pain as an achiness. He denies any new exercises or any new weightlifting. He denies any numbness, tingling, or weakness.  ROS: No unexpected weight loss, fever, chills, swelling, instability, numbness/tingling, redness, rash, otherwise see HPI    HISTORY: Past Medical, Surgical, Social, and Family History Reviewed & Updated per EMR.   Pertinent Historical Findings include: PMSHx - melanoma, pvc, prostate cancer    PSHx -  No tobacco. Occasional EtOH  Medications - prilosec   DATA REVIEWED: None to review   PHYSICAL EXAM:  VS: BP:122/78  HR: bpm  TEMP: ( )  RESP:   HT:6' (182.9 cm)   WT:190 lb (86.2 kg)  BMI:25.8 PHYSICAL EXAM: Gen: NAD, alert, cooperative with exam, well-appearing HEENT: clear conjunctiva, EOMI CV:  no edema, capillary refill brisk,  Resp: non-labored, normal speech Skin: no rashes, normal turgor  Neuro: no gross deficits.  Psych:  alert and oriented Hip:  No pain to palpation of the greater trochanter bilaterally. Normal internal and external rotation of the hip. Normal hip flexion. 5 out of 5 strength in lower extremities bilaterally. Neurovascularly intact. Negative log roll. No pain  with crossover flexion testing. Some pain to palpation over the initial tuberosity of the right buttock. No pain over the palpation of the piriformis.  Limited US: Right Hip and buttock: Normal appearance at the issue tuberosity with no suggestion of an avulsion fracture or tendinopathy. Hypoechoic change of the bursa of the greater trochanter but no pain with palpation. The IT band was viewed and long and short axis from the hip to the insertion and looked to be normal in nature. From oral nerve was viewed in transverse view and normal-appearing.  ASSESSMENT & PLAN:   Right leg pain He may have more than one thing occurring on the right side. He may have high hamstring syndrome vs  biceps femoris compression of the sciatic nerve causing pain. Could also be piriformis syndrome but clinical testing didn't match. Sciatic nerve was viewed on ultrasound it did not appear to be irritated with no fluid around it. His lateral thigh pain is consistent with a bursal pain but no reproduction clinically. Doesn't appear to be arthritic hip. Also may need to consider meralgia paresthetica.  - We have started gabapentin in order to help with any pathic pain. - Also send an mobic to help with any inflammation which he can start in a couple of weeks. - Also going to obtain lumbar, pelvis and right hip x-rays  - he will follow up in three weeks

## 2015-10-07 ENCOUNTER — Ambulatory Visit
Admission: RE | Admit: 2015-10-07 | Discharge: 2015-10-07 | Disposition: A | Discharge status: Home / Self Care | Payer: BC Managed Care – PPO | Source: Ambulatory Visit | Attending: Sports Medicine | Admitting: Sports Medicine

## 2015-10-07 ENCOUNTER — Telehealth: Payer: Self-pay | Admitting: Family Medicine

## 2015-10-07 DIAGNOSIS — M79604 Pain in right leg: Secondary | ICD-10-CM | POA: Insufficient documentation

## 2015-10-07 NOTE — Assessment & Plan Note (Addendum)
He may have more than one thing occurring on the right side. He may have high hamstring syndrome vs  biceps femoris compression of the sciatic nerve causing pain. Could also be piriformis syndrome but clinical testing didn't match. Sciatic nerve was viewed on ultrasound it did not appear to be irritated with no fluid around it. His lateral thigh pain is consistent with a bursal pain but no reproduction clinically. Doesn't appear to be arthritic hip. Also may need to consider meralgia paresthetica.  - We have started gabapentin in order to help with any pathic pain. - Also send an mobic to help with any inflammation which he can start in a couple of weeks. - Also going to obtain lumbar, pelvis and right hip x-rays  - he will follow up in three weeks

## 2015-10-07 NOTE — Telephone Encounter (Signed)
Left VM for patient. If he calls back please have him speak with a nurse/CMA and inform that his lumbar x-ray showed mild spondylolysis which is a type of degenerative change and his hip x-ray showed no abnormality. So we can continue with the medications and see if there are any changes to his symptoms until the next time we see him.   If any questions then please take the best time and phone number to call and I will try to call him back.   Rosemarie Ax, MD PGY-4, [facility] 10/07/2015, 3:36 PM

## 2015-10-26 DIAGNOSIS — C4492 Squamous cell carcinoma of skin, unspecified: Secondary | ICD-10-CM

## 2015-10-26 HISTORY — DX: Squamous cell carcinoma of skin, unspecified: C44.92

## 2015-12-07 ENCOUNTER — Ambulatory Visit (INDEPENDENT_AMBULATORY_CARE_PROVIDER_SITE_OTHER): Payer: BC Managed Care – PPO | Admitting: Urgent Care

## 2015-12-07 VITALS — BP 118/76 | HR 70 | Temp 97.7°F | Resp 16 | Ht 71.0 in | Wt 195.0 lb

## 2015-12-07 DIAGNOSIS — Z111 Encounter for screening for respiratory tuberculosis: Secondary | ICD-10-CM

## 2015-12-07 DIAGNOSIS — Z7189 Other specified counseling: Secondary | ICD-10-CM

## 2015-12-07 DIAGNOSIS — Z7185 Encounter for immunization safety counseling: Secondary | ICD-10-CM

## 2015-12-07 NOTE — Patient Instructions (Addendum)
I will call you with your results and provide you with a letter reporting the results. If it turns out you do not have immunity to some of these labs, then you may need a booster series for that. This simply involves you coming back to our clinic and receiving an injection for these immunizations as necessary.

## 2015-12-07 NOTE — Progress Notes (Signed)
° °  By signing my name below, I, Mesha Guinyard, attest that this documentation has been prepared under the direction and in the presence of Jaynee Eagles, [place].  Electronically Signed: Verlee Monte, Medical Scribe. 12/07/15. 2:53 PM.  MRN: [id number] DOB: [birth year]  Subjective:   Renardo Cheatum is a 65 y.o. male presenting for chief complaint for immunizations. 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 He will be starting 11/6. Needs titers drawn for MMR, varicella and Hepatitis B. Needs tuberculosis screening.  . Current Outpatient Prescriptions:    gabapentin (NEURONTIN) 100 MG capsule, Take 1 capsule (100 mg total) by mouth at bedtime., Disp: 30 capsule, Rfl: 1   meloxicam (MOBIC) 15 MG tablet, Take 1 tablet (15 mg total) by mouth daily., Disp: 30 tablet, Rfl: 0  Akiel has No Known Allergies.  Bayard  has a past medical history of Acid reflux (OCCASIONALLY--  TAKE PREVACID); Chronic cluster headache (FOLLOWED AT [place] [facility]); radiation therapy (12/28/11); Prostate cancer ([place]) (DX  10/11/11); and PSA elevation. Also  has a past surgical history that includes ostate biopsy (10/11/11); Appendectomy (AGE 46); Radioactive seed implant (12/28/2011); and ostate surgery.  Objective:   Vitals: BP 118/76 (BP Location: Left Arm, Patient Position: Sitting, Cuff Size: Normal)    Pulse 70    Temp 97.7 F (36.5 C) (Oral)    Resp 16    Ht _0  (1.803 m)    Wt 195 lb (88.5 kg)    SpO2 98%    BMI 27.20 kg/m   Physical Exam  Constitutional: He is oriented to person, place, and time. He appears well-developed and well-nourished.  Cardiovascular: Normal rate.   Pulmonary/Chest: Effort normal.  Neurological: He is alert and oriented to person, place, and time.   Assessment and Plan :   1. Encounter for counseling regarding immunization - Measles/Mumps/Rubella Immunity - Varicella zoster antibody, IgG  2. Screening for tuberculosis - Quantiferon  tb gold assay   Jaynee Eagles, PA-C [facility] [phone] 12/07/2015 2:53 PM

## 2015-12-08 LAB — MEASLES/MUMPS/RUBELLA IMMUNITY
Mumps IgG: >300 AU/mL — ABNORMAL HIGH (ref <9.00)
Rubella: 21.5 Index — ABNORMAL HIGH (ref <0.90)
Rubeola IgG: >300 AU/mL — ABNORMAL HIGH (ref <25.00)

## 2015-12-08 LAB — VARICELLA ZOSTER ANTIBODY, IGG: Varicella IgG: 2079 Index — ABNORMAL HIGH (ref <135.00)

## 2015-12-09 ENCOUNTER — Encounter: Payer: Self-pay | Admitting: Urgent Care

## 2015-12-09 LAB — QUANTIFERON TB GOLD ASSAY (BLOOD)
Interferon Gamma Release Assay: NEGATIVE
Mitogen-Nil: >10 IU/mL
QUANTIFERON TB AG MINUS NIL: 0.03 [IU]/mL
Quantiferon Nil Value: 0.03 IU/mL

## 2016-03-15 ENCOUNTER — Ambulatory Visit (INDEPENDENT_AMBULATORY_CARE_PROVIDER_SITE_OTHER): Payer: Medicare HMO | Admitting: Family Medicine

## 2016-03-15 ENCOUNTER — Encounter: Payer: Self-pay | Admitting: Family Medicine

## 2016-03-15 VITALS — BP 114/78 | HR 66 | Temp 97.3°F | Resp 16 | Ht 71.0 in | Wt 205.2 lb

## 2016-03-15 DIAGNOSIS — Z8546 Personal history of malignant neoplasm of prostate: Secondary | ICD-10-CM | POA: Diagnosis not present

## 2016-03-15 DIAGNOSIS — Z23 Encounter for immunization: Secondary | ICD-10-CM | POA: Diagnosis not present

## 2016-03-15 DIAGNOSIS — R9431 Abnormal electrocardiogram [ECG] [EKG]: Secondary | ICD-10-CM | POA: Diagnosis not present

## 2016-03-15 DIAGNOSIS — Z0000 Encounter for general adult medical examination without abnormal findings: Secondary | ICD-10-CM

## 2016-03-15 LAB — POCT URINALYSIS DIP (MANUAL ENTRY)
BILIRUBIN UA: NEGATIVE
GLUCOSE UA: NEGATIVE
Ketones, POC UA: NEGATIVE
Leukocytes, UA: NEGATIVE
NITRITE UA: NEGATIVE
PH UA: 5.5
Protein Ur, POC: NEGATIVE
RBC UA: NEGATIVE
SPEC GRAV UA: 1.02
UROBILINOGEN UA: 0.2

## 2016-03-15 NOTE — Progress Notes (Signed)
QUICK REFERENCE INFORMATION:  The ABCs of Providing the Initial Preventive Physical Examination CMS.gov Medicare Learning Network  Welcome To [employer] Physical Allen Burnett is a 66 y.o. male who presents for a Welcome to Medicare exam.   Patient Active Problem List   Diagnosis Date Noted  . Right leg pain 10/07/2015  . Insomnia 10/29/2013  . Melanoma of skin, site unspecified 10/29/2013  . PVC (premature ventricular contraction) 08/06/2013  . Cluster headache 10/04/2012  . Hyperlipidemia 10/02/2012  . Prostate cancer ([place]) 11/08/2011    Past Medical History:  Diagnosis Date  . Acid reflux OCCASIONALLY--  TAKE PREVACID  . Chronic cluster headache FOLLOWED AT [facility]   PRN OXYGEN VIA [place] WHEN FEELS HEADACHE STARTING/  ON PREVENTION MEDS  . Hx of radiation therapy 12/28/11   prostate seed implant/75 seeds/,Dr.Dahlstedt  . Prostate cancer ([place]) DX  10/11/11   Adenocarcinoma,gleason 3+3=6,PSA=5.39   volume=30cc  . PSA elevation    2009=3.4,2010=3.46,2011=3.16,2012=3.76,07/2011=4.85     Past Surgical History:  Procedure Laterality Date  . APPENDECTOMY  AGE 13  . PROSTATE BIOPSY  10/11/11   gleason 3+3=6,PSA=5.39,Adenocarcinoma  . PROSTATE SURGERY    . RADIOACTIVE SEED IMPLANT  12/28/2011   Procedure: RADIOACTIVE SEED IMPLANT;  Surgeon: Franchot Gallo, MD;  Location: [facility];  Service: Urology;  Laterality: N/A;  75 seeds implanted  no seeds found in bladder     Outpatient Medications Prior to Visit  Medication Sig Dispense Refill  . gabapentin (NEURONTIN) 100 MG capsule Take 1 capsule (100 mg total) by mouth at bedtime. (Patient not taking: Reported on 03/15/2016) 30 capsule 1  . meloxicam (MOBIC) 15 MG tablet Take 1 tablet (15 mg total) by mouth daily. (Patient not taking: Reported on 03/15/2016) 30 tablet 0   No facility-administered medications prior to visit.    No Known Allergies   Family History  Problem Relation Age of Onset  .  Cancer Brother 43    colon cancer now age 65  . Hyperlipidemia Mother   . Heart disease Maternal Grandfather   . Cancer Brother      Social History  Substance Use Topics  . Smoking status: Former Smoker    Packs/day: 1.00    Years: 10.00    Types: Cigarettes    Quit date: 07/24/1984  . Smokeless tobacco: Former Systems developer    Types: Chew  . Alcohol use 3.6 - 4.8 oz/week    6 - 8 Standard drinks or equivalent per week     Comment: beer on weekends     ROS  Recent Hospitalizations? no  Current Medical Providers and Suppliers: [facility] Patient Care Team: Forrest Moron, MD as PCP - General (Internal Medicine) No future appointments.  Age-appropriate Screening Schedule: The list below includes current immunization status and future screening recommendations based on patient's age. Orders for these recommended tests are listed in the plan section. The patient has been provided with a written plan. Immunization History  Administered Date(s) Administered  . Influenza-Unspecified 12/06/2015  . Pneumococcal Conjugate-13 10/29/2013   Health Maintenance  Topic Date Due  . HIV Screening  03/29/1966  . TETANUS/TDAP  03/29/1970  . COLONOSCOPY  03/29/2001  . ZOSTAVAX  03/30/2011  . INFLUENZA VACCINE  Completed  . Hepatitis C Screening  Completed    Health Habits  Exercise: 3-4 times/week. [facility] Diet: in general, a "healthy" diet    Alcohol: rare  Depression screen [facility] 2/9 03/15/2016 12/07/2015 01/20/2015  Decreased  Interest 0 0 0  Down, Depressed, Hopeless 0 0 0  PHQ - 2 Score 0 0 0    Depression Severity and Treatment Recommendations:  0-4= None  5-9= Mild / Treatment: Support, educate to call if worse; return in one month  10-14= Moderate / Treatment: Support, watchful waiting; Antidepressant or Psycotherapy  15-19= Moderately severe / Treatment: Antidepressant OR Psychotherapy  >= 20 = Major depression, severe / Antidepressant  AND Psychotherapy  Functional Status Survey: Is the patient deaf or have difficulty hearing?: No Does the patient have difficulty seeing, even when wearing glasses/contacts?: No Does the patient have difficulty concentrating, remembering, or making decisions?: No Does the patient have difficulty walking or climbing stairs?: No Does the patient have difficulty dressing or bathing?: No Does the patient have difficulty doing errands alone such as visiting a doctor's office or shopping?: No  Functional Ability  Does the patient need help with: Ron Parker index)  Bathing: no Dressing : no Toileting: no Transferring: no Feeding: no Does the patient have issues with incontinence: No     Hearing Evaluation  Do you have trouble hearing the television when others do not? no  Do you have to strain to hear/understand conversations? no    Falls Risk:  Does the patient need assistance with ambulation? no  Does the patient have a history of a fall in the last 90 days? no Is the patient at risk for falls? no Was the patient's timed "Get Up and Go Test" unsteady or longer than 30 seconds? no   Advanced Care Planning Patient has executed an Advance Directive: no  If no, patient was given the opportunity to execute an Advance Directive today? yes  Are the patient's advanced directives in [place]? no  This patient has the ability to prepare an Advance Directive: yes Provider is willing to follow the patient's wishes: yes   Cognitive Assessment Does the patient have evidence of cognitive impairment? no The patient does not have evidence of a change in mood/affect, appearance,  speech, memory or motor skills.   Objective:   Vitals:   03/15/16 0816  BP: 114/78  Pulse: 66  Resp: 16  Temp: 97.3 F (36.3 C)  TempSrc: Oral  SpO2: 96%  Weight: 205 lb 3.2 oz (93.1 kg)  Height: 5\' 11"  (1.803 m)    Body mass index is 28.62 kg/m.   Hearing/Vision exam:  Visual Acuity Screening   Right eye  Left eye Both eyes  Without correction: 20/40 20/50 20/40   With correction:        Assessment:  Welcome To Medicare Exam     Plan:  During the course of the visit the patient was educated and counseled about appropriate screening and preventive services including:  -  Screening ECG -  Vaccinations -  Screening lipid - advance derectives   Discussed the patient's BMI with him. The BMI BMI is not in the acceptable range; no BMI management plan is appropriate. Patient already has a good exercise program   The following orders were placed at today's visit;  Orders Placed This Encounter  Procedures  . Pneumococcal polysaccharide vaccine 23-valent greater than or equal to 2yo subcutaneous/IM  . Lipid panel  . Comprehensive metabolic panel  . POCT urinalysis dipstick  . EKG 12-Lead    Independent ECG review by Provider Pt had an abnormal ECG with ischemic changes, t wave changes No st elevation  Referred pt to Cardiology for further screening   No Follow-up on file.  No  future appointments.  Patient Instructions       IF you received an x-ray today, you will receive an invoice from [facility]. Please contact [facility] at [phone] with questions or concerns regarding your invoice.   IF you received labwork today, you will receive an invoice from [place]. Please contact [facility] at [phone] with questions or concerns regarding your invoice.   Our billing staff will not be able to assist you with questions regarding bills from these companies.  You will be contacted with the lab results as soon as they are available. The fastest way to get your results is to activate your My Chart account. Instructions are located on the last page of this paperwork. If you have not heard from [place] regarding the results in 2 weeks, please contact this office.        An after visit summary with all of these plans was given to the patient.

## 2016-03-15 NOTE — Patient Instructions (Addendum)
IF you received an x-ray today, you will receive an invoice from [facility]. Please contact [facility] at [phone] with questions or concerns regarding your invoice.   IF you received labwork today, you will receive an invoice from [place]. Please contact [facility] at [phone] with questions or concerns regarding your invoice.   Our billing staff will not be able to assist you with questions regarding bills from these companies.  You will be contacted with the lab results as soon as they are available. The fastest way to get your results is to activate your My Chart account. Instructions are located on the last page of this paperwork. If you have not heard from [place] regarding the results in 2 weeks, please contact this office.     Advance Directive Advance directives are the legal documents that allow you to make choices about your health care and medical treatment if you cannot speak for yourself. Advance directives are a way for you to communicate your wishes to family, friends, and health care providers. The specified people can then convey your decisions about end-of-life care to avoid confusion if you should become unable to communicate. Ideally, the process of discussing and writing advance directives should happen over time rather than making decisions all at once. Advance directives can be modified as your situation changes, and you can change your mind at any time, even after you have signed the advance directives. Each state has its own laws regarding advance directives. You may want to check with your health care provider, attorney, or state representative about the law in your state. Below are some examples of advance directives. HEALTH CARE PROXY AND DURABLE POWER OF ATTORNEY FOR HEALTH CARE A health care proxy is a person (agent) appointed to make medical decisions for you if you cannot. Generally, people choose someone they know well and trust to  represent their preferences when they can no longer do so. You should be sure to ask this person for agreement to act as your agent. An agent may have to exercise judgment in the event of a medical decision for which your wishes are not known. A durable power of attorney for health care is a legal document that names your health care proxy. Depending on the laws in your state, after the document is written, it may also need to be:  Signed.  Notarized.  Dated.  Copied.  Witnessed.  Incorporated into your medical record. You may also want to appoint someone to manage your financial affairs if you cannot. This is called a durable power of attorney for finances. It is a separate legal document from the durable power of attorney for health care. You may choose the same person or someone different from your health care proxy to act as your agent in financial matters. LIVING WILL A living will is a set of instructions documenting your wishes about medical care when you cannot care for yourself. It is used if you become:  Terminally ill.  Incapacitated.  Unable to communicate.  Unable to make decisions. Items to consider in your living will include:  The use or non-use of life-sustaining equipment, such as dialysis machines and breathing machines (ventilators).  A do not resuscitate (DNR) order, which is the instruction not to use cardiopulmonary resuscitation (CPR) if breathing or heartbeat stops.  Tube feeding.  Withholding of food and fluids.  Comfort (palliative) care when the goal becomes comfort rather than a cure.  Organ and tissue donation. A living  will does not give instructions about distribution of your money and property if you should pass away. It is advisable to seek the expert advice of a lawyer in drawing up a will regarding your possessions. Decisions about taxes, beneficiaries, and asset distribution will be legally binding. This process can relieve your family and  friends of any burdens surrounding disputes or questions that may come up about the allocation of your assets. DO NOT RESUSCITATE (DNR) A do not resuscitate (DNR) order is a request to not have CPR in the event that your heart stops beating or you stop breathing. Unless given other instructions, a health care provider will try to help any patient whose heart has stopped or who has stopped breathing.  This information is not intended to replace advice given to you by your health care provider. Make sure you discuss any questions you have with your health care provider. Document Released: 05/02/2007 Document Revised: 05/17/2015 Document Reviewed: 06/12/2012 Elsevier Interactive Patient Education  2017 [employer].

## 2016-03-16 LAB — COMPREHENSIVE METABOLIC PANEL
A/G RATIO: 1.9 (ref 1.2–2.2)
ALBUMIN: 4.4 g/dL (ref 3.6–4.8)
ALK PHOS: 59 IU/L (ref 39–117)
ALT: 18 IU/L (ref 0–44)
AST: 18 IU/L (ref 0–40)
BUN/Creatinine Ratio: 20 (ref 10–24)
BUN: 20 mg/dL (ref 8–27)
Bilirubin Total: 0.5 mg/dL (ref 0.0–1.2)
CO2: 23 mmol/L (ref 18–29)
CREATININE: 0.98 mg/dL (ref 0.76–1.27)
Calcium: 9.1 mg/dL (ref 8.6–10.2)
Chloride: 103 mmol/L (ref 96–106)
GFR calc Af Amer: 94 mL/min/{1.73_m2} (ref >59)
GFR calc non Af Amer: 81 mL/min/{1.73_m2} (ref >59)
GLOBULIN, TOTAL: 2.3 g/dL (ref 1.5–4.5)
Glucose: 92 mg/dL (ref 65–99)
Potassium: 4.5 mmol/L (ref 3.5–5.2)
Sodium: 139 mmol/L (ref 134–144)
Total Protein: 6.7 g/dL (ref 6.0–8.5)

## 2016-03-16 LAB — LIPID PANEL
Chol/HDL Ratio: 3.1 ratio units (ref 0.0–5.0)
Cholesterol, Total: 218 mg/dL — ABNORMAL HIGH (ref 100–199)
HDL: 71 mg/dL (ref >39)
LDL Calculated: 132 mg/dL — ABNORMAL HIGH (ref 0–99)
Triglycerides: 75 mg/dL (ref 0–149)
VLDL CHOLESTEROL CAL: 15 mg/dL (ref 5–40)

## 2016-03-16 LAB — PSA: PROSTATE SPECIFIC AG, SERUM: 0.2 ng/mL (ref 0.0–4.0)

## 2016-03-20 ENCOUNTER — Telehealth: Payer: Self-pay

## 2016-03-20 NOTE — Telephone Encounter (Signed)
Last seen 03/2015

## 2016-03-20 NOTE — Telephone Encounter (Signed)
Patient called back and stated that he needs to get his Xanax refilled, stated he uses it to sleep and that Dr Laney Pastor use to give it to him. He takes it on and off for the past few years, states his son is bi-polar and whenever they are fighting or feuding he takes this to help him sleep.   He [facility] the [facility] [place]  His call back number is [phone]

## 2016-03-20 NOTE — Telephone Encounter (Signed)
Left message on pt's voicemail requesting that he call the office and give more information about his concerns or questions for Dr. Nolon Rod so that she can respond appropriately. Office phone number provided.

## 2016-03-20 NOTE — Telephone Encounter (Signed)
Please call patient to get details for Dr. Nolon Rod

## 2016-03-20 NOTE — Telephone Encounter (Signed)
Pt is needing a return call from dr Nolon Rod  Would not go into more details  Best number [phone]

## 2016-03-21 NOTE — Telephone Encounter (Signed)
[email or web address]

## 2016-03-21 NOTE — Telephone Encounter (Signed)
Pt advised and sent up front for an appt.

## 2016-03-21 NOTE — Telephone Encounter (Signed)
Please let the patient know that I do not use xanax to treat sleep.  I can set him up with Psychiatry if he want to talk about that class of medication.  This will require an office visit to discuss his other option.  If he has anxiety there are good medications that help with anxiety and sleep that can be taken as needed.  While you have him on the phone please let him know his  PSA levels are normal. 0.2 Please let him know his cholesterol is borderline high so he should add a fish oil supplement over the counter of 600mg .  His glucose, kidney function and liver function are normal.

## 2016-03-27 ENCOUNTER — Ambulatory Visit (INDEPENDENT_AMBULATORY_CARE_PROVIDER_SITE_OTHER): Payer: Medicare HMO | Admitting: Family Medicine

## 2016-03-27 VITALS — BP 123/75 | HR 65 | Temp 98.1°F | Resp 17 | Ht 71.0 in | Wt 207.0 lb

## 2016-03-27 DIAGNOSIS — R69 Illness, unspecified: Secondary | ICD-10-CM | POA: Diagnosis not present

## 2016-03-27 DIAGNOSIS — F411 Generalized anxiety disorder: Secondary | ICD-10-CM

## 2016-03-27 MED ORDER — VENLAFAXINE HCL 37.5 MG PO TABS: 37.5000 mg | ORAL_TABLET | Freq: Two times a day (BID) | ORAL | 6 refills | Status: DC

## 2016-03-27 MED ORDER — CLONAZEPAM 1 MG PO TABS: 1.0000 mg | ORAL_TABLET | Freq: Every day | ORAL | 5 refills | Status: DC

## 2016-03-27 NOTE — Patient Instructions (Addendum)
IF you received an x-ray today, you will receive an invoice from [facility] Rad[facility]. Please contact [facility] at [phone] with questions or concerns regarding your invoice.   IF you received labwork today, you will receive an invoice from [place]. Please contact [facility] at [phone] with questions or concerns regarding your invoice.   Our billing staff will not be able to assist you with questions regarding bills from these companies.  You will be contacted with the lab results as soon as they are available. The fastest way to get your results is to activate your My Chart account. Instructions are located on the last page of this paperwork. If you have not heard from [place] regarding the results in 2 weeks, please contact this office.     Generalized Anxiety Disorder Generalized anxiety disorder (GAD) is a mental disorder. It interferes with life functions, including relationships, work, and school. GAD is different from normal anxiety, which everyone experiences at some point in their lives in response to specific life events and activities. Normal anxiety actually helps [place] prepare for and get through these life events and activities. Normal anxiety goes away after the event or activity is over.  GAD causes anxiety that is not necessarily related to specific events or activities. It also causes excess anxiety in proportion to specific events or activities. The anxiety associated with GAD is also difficult to control. GAD can vary from mild to severe. People with severe GAD can have intense waves of anxiety with physical symptoms (panic attacks).  SYMPTOMS The anxiety and worry associated with GAD are difficult to control. This anxiety and worry are related to many life events and activities and also occur more days than not for 6 months or longer. People with GAD also have three or more of the following symptoms (one or more in children):  Restlessness.    Fatigue.  Difficulty concentrating.   Irritability.  Muscle tension.  Difficulty sleeping or unsatisfying sleep. DIAGNOSIS GAD is diagnosed through an assessment by your health care provider. Your health care provider will ask you questions aboutyour mood,physical symptoms, and events in your life. Your health care provider may ask you about your medical history and use of alcohol or drugs, including prescription medicines. Your health care provider may also do a physical exam and blood tests. Certain medical conditions and the use of certain substances can cause symptoms similar to those associated with GAD. Your health care provider may refer you to a mental health specialist for further evaluation. TREATMENT The following therapies are usually used to treat GAD:   Medication. Antidepressant medication usually is prescribed for long-term daily control. Antianxiety medicines may be added in severe cases, especially when panic attacks occur.   Talk therapy (psychotherapy). Certain types of talk therapy can be helpful in treating GAD by providing support, education, and guidance. A form of talk therapy called cognitive behavioral therapy can teach you healthy ways to think about and react to daily life events and activities.  Stress managementtechniques. These include yoga, meditation, and exercise and can be very helpful when they are practiced regularly. A mental health specialist can help determine which treatment is best for you. Some people see improvement with one therapy. However, other people require a combination of therapies. This information is not intended to replace advice given to you by your health care provider. Make sure you discuss any questions you have with your health care provider. Document Released: 05/20/2012 Document Revised: 02/13/2014 Document Reviewed: 05/20/2012 Elsevier  Education  2017 Elsevier Inc.  

## 2016-03-27 NOTE — Progress Notes (Signed)
Chief Complaint  Patient presents with  . Medication Refill    xanax     HPI   Pt reports that he primarily uses Xanax for anxiety related to bipolar disorder. He has been on the medication for his anxiety related to his son. He does not take it daily but has been on it as needed for 5 or 6 years. He reports that he takes the xanax at bedtime as needed for sleep. He reports that he was never started on a daily anxiety medication.  He reports that he has never been on any daily medications for anxiety.  GAD 7 : Generalized Anxiety Score 03/27/2016  Nervous, Anxious, on Edge 1  Control/stop worrying 2  Worry too much - different things 2  Trouble relaxing 2  Restless 1  Easily annoyed or irritable 1  Afraid - awful might happen 2  Total GAD 7 Score 11  Anxiety Difficulty Somewhat difficult      Past Medical History:  Diagnosis Date  . Acid reflux OCCASIONALLY--  TAKE PREVACID  . Chronic cluster headache FOLLOWED AT [place]   PRN OXYGEN VIA [place] WHEN FEELS HEADACHE STARTING/  ON PREVENTION MEDS  . Hx of radiation therapy 12/28/11   prostate seed implant/75 seeds/,Dr.Dahlstedt  . Prostate cancer ([place]) DX  10/11/11   Adenocarcinoma,gleason 3+3=6,PSA=5.39   volume=30cc  . PSA elevation    2009=3.4,2010=3.46,2011=3.16,2012=3.76,07/2011=4.85    Current Outpatient Prescriptions  Medication Sig Dispense Refill  . clonazePAM (KLONOPIN) 1 MG tablet Take 1 tablet (1 mg total) by mouth at bedtime. 30 tablet 5  . venlafaxine (EFFEXOR) 37.5 MG tablet Take 1 tablet (37.5 mg total) by mouth 2 (two) times daily with a meal. 60 tablet 6   No current facility-administered medications for this visit.     Allergies: No Known Allergies  Past Surgical History:  Procedure Laterality Date  . APPENDECTOMY  AGE 46  . PROSTATE BIOPSY  10/11/11   gleason 3+3=6,PSA=5.39,Adenocarcinoma  . PROSTATE SURGERY    . RADIOACTIVE SEED IMPLANT  12/28/2011   Procedure: RADIOACTIVE SEED  IMPLANT;  Surgeon: Franchot Gallo, MD;  Location: [facility];  Service: Urology;  Laterality: N/A;  75 seeds implanted  no seeds found in bladder    Social History   Social History  . Marital status: Married    Spouse name: N/A  . Number of children: 2  . Years of education: N/A   Occupational History  .      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 Smoking status: Former Smoker    Packs/day: 1.00    Years: 10.00    Types: Cigarettes    Quit date: 07/24/1984  . Smokeless tobacco: Former Systems developer    Types: Chew  . Alcohol use 3.6 - 4.8 oz/week    6 - 8 Standard drinks or equivalent per week     Comment: beer on weekends  . Drug use: No  . Sexual activity: Yes    Partners: Female   Other Topics Concern  . None   Social History Narrative  . None    ROS  Objective: Vitals:   03/27/16 0901  BP: 123/75  Pulse: 65  Resp: 17  Temp: 98.1 F (36.7 C)  TempSrc: Oral  SpO2: 99%  Weight: 207 lb (93.9 kg)  Height: 5\' 11"  (1.803 m)    Physical Exam  Constitutional: He is oriented to  person, place, and time. He appears well-developed and well-nourished.  HENT:  Head: Normocephalic and atraumatic.  Pulmonary/Chest: Effort normal.  Neurological: He is alert and oriented to person, place, and time.  Psychiatric: His mood appears anxious.    Assessment and Plan Aizik was seen today for medication refill.  Diagnoses and all orders for this visit:  Generalized anxiety disorder- discussed continuing counsel with his priest Changed xanax to klonopin and added effexor daily for anxiety Talked about some strategies such as creating a contract for his son to sign so that if he violates it then he can show him where the agreement has been violated and showing him consequences The patient is hesitant about this approach but will consider it since his son is struggling to make it on his own due to poor  insight  Will plan a 3 month follow up for anxiety  Other orders -     clonazePAM (KLONOPIN) 1 MG tablet; Take 1 tablet (1 mg total) by mouth at bedtime. -     venlafaxine (EFFEXOR) 37.5 MG tablet; Take 1 tablet (37.5 mg total) by mouth 2 (two) times daily with a meal.    A total of 30 minutes were spent face-to-face with the patient during this encounter and over half of that time was spent on counseling and coordination of care.  [place]

## 2016-03-30 DIAGNOSIS — D229 Melanocytic nevi, unspecified: Secondary | ICD-10-CM

## 2016-03-30 DIAGNOSIS — D225 Melanocytic nevi of trunk: Secondary | ICD-10-CM | POA: Diagnosis not present

## 2016-03-30 DIAGNOSIS — D492 Neoplasm of unspecified behavior of bone, soft tissue, and skin: Secondary | ICD-10-CM | POA: Diagnosis not present

## 2016-03-30 DIAGNOSIS — L570 Actinic keratosis: Secondary | ICD-10-CM | POA: Diagnosis not present

## 2016-03-30 HISTORY — DX: Melanocytic nevi, unspecified: D22.9

## 2016-04-07 ENCOUNTER — Encounter: Payer: Self-pay | Admitting: Cardiology

## 2016-04-07 NOTE — Telephone Encounter (Signed)
Addressed in clinic

## 2016-04-10 NOTE — Progress Notes (Signed)
HPI: 66 year old male for evaluation of abnormal electrocardiogram. Electrocardiogram 10/13/2016 showed sinus rhythm, incomplete right bundle branch block, probable limb lead reversal. Patient denies dyspnea on exertion, orthopnea, PND, pedal edema, chest pain or syncope. No claudication.  Current Outpatient Prescriptions  Medication Sig Dispense Refill  . clonazePAM (KLONOPIN) 1 MG tablet Take 1 tablet (1 mg total) by mouth at bedtime. 30 tablet 5  . venlafaxine (EFFEXOR) 37.5 MG tablet Take 1 tablet (37.5 mg total) by mouth 2 (two) times daily with a meal. 60 tablet 6   No current facility-administered medications for this visit.     No Known Allergies   Past Medical History:  Diagnosis Date  . Acid reflux OCCASIONALLY--  TAKE PREVACID  . Chronic cluster headache FOLLOWED AT [place]   PRN OXYGEN VIA [place] WHEN FEELS HEADACHE STARTING/  ON PREVENTION MEDS  . Hx of radiation therapy 12/28/11   prostate seed implant/75 seeds/,Dr.Dahlstedt  . Hyperlipidemia   . Prostate cancer ([place]) DX  10/11/11   Adenocarcinoma,gleason 3+3=6,PSA=5.39   volume=30cc  . PSA elevation    2009=3.4,2010=3.46,2011=3.16,2012=3.76,07/2011=4.85    Past Surgical History:  Procedure Laterality Date  . APPENDECTOMY  AGE 76  . PROSTATE BIOPSY  10/11/11   gleason 3+3=6,PSA=5.39,Adenocarcinoma  . PROSTATE SURGERY    . RADIOACTIVE SEED IMPLANT  12/28/2011   Procedure: RADIOACTIVE SEED IMPLANT;  Surgeon: Franchot Gallo, MD;  [facility];  Service: Urology;  Laterality: N/A;  75 seeds implanted  no seeds found in bladder    Social History   Social History  . Marital status: Married    Spouse name: N/A  . Number of children: 2  . Years of education: N/A   Occupational History  .      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 Smoking status: Former Smoker    Packs/day: 1.00    Years: 10.00    Types:  Cigarettes    Quit date: 07/24/1984  . Smokeless tobacco: Former Systems developer    Types: Chew  . Alcohol use 3.6 - 4.8 oz/week    6 - 8 Standard drinks or equivalent per week     Comment: beer on weekends  . Drug use: No  . Sexual activity: Yes    Partners: Female   Other Topics Concern  . Not on file   Social History Narrative  . No narrative on file    Family History  Problem Relation Age of Onset  . Cancer Brother 68    colon cancer now age 18  . Hyperlipidemia Mother   . Heart disease Maternal Grandfather   . Cancer Brother     ROS: Shoulder pain but no fevers or chills, productive cough, hemoptysis, dysphasia, odynophagia, melena, hematochezia, dysuria, hematuria, rash, seizure activity, orthopnea, PND, pedal edema, claudication. Remaining systems are negative.  Physical Exam:   Blood pressure 128/70, pulse 65, height 5\' 11"  (1.803 m), weight 199 lb (90.3 kg).  General:  Well developed/well nourished in NAD Skin warm/dry Patient not depressed No peripheral clubbing Back-normal HEENT-normal/normal eyelids Neck supple/normal carotid upstroke bilaterally; no bruits; no JVD; no thyromegaly chest - CTA/ normal expansion CV - RRR/normal S1 and S2; no murmurs, rubs or gallops;  PMI nondisplaced Abdomen -NT/ND, no HSM, no mass, + bowel sounds, positive bruit 2+ femoral pulses, no bruits Ext-no edema, chords, 2+ DP Neuro-grossly nonfocal  ECG - Sinus rhythm, No  ST changes; personally reviewed  A/P  1 Abnormal ECG-I have reviewed the patient's outside electrocardiogram and it appears that he had limb lead reversal. We repeated his electrocardiogram today and it is normal. He is having no cardiac symptoms including no dyspnea or chest pain. He exercises frequently with no symptoms. We will not pursue further cardiac evaluation.  2 bruit-schedule abdominal ultrasound to exclude aneurysm.  Kirk Ruths, MD

## 2016-04-14 ENCOUNTER — Ambulatory Visit (INDEPENDENT_AMBULATORY_CARE_PROVIDER_SITE_OTHER): Payer: Medicare HMO | Admitting: Cardiology

## 2016-04-14 ENCOUNTER — Encounter: Payer: Self-pay | Admitting: Cardiology

## 2016-04-14 VITALS — BP 128/70 | HR 65 | Ht 71.0 in | Wt 199.0 lb

## 2016-04-14 DIAGNOSIS — R9431 Abnormal electrocardiogram [ECG] [EKG]: Secondary | ICD-10-CM

## 2016-04-14 DIAGNOSIS — R0989 Other specified symptoms and signs involving the circulatory and respiratory systems: Secondary | ICD-10-CM | POA: Diagnosis not present

## 2016-04-14 NOTE — Patient Instructions (Signed)
Medication Instructions:   NO CHANGE  Testing/Procedures:  Your physician has requested that you have an abdominal aorta duplex. During this test, an ultrasound is used to evaluate the aorta. Allow 30 minutes for this exam. Do not eat after midnight the day before and avoid carbonated beverages   Follow-Up:  Your physician recommends that you schedule a follow-up appointment in: AS NEEDED      

## 2016-04-24 ENCOUNTER — Ambulatory Visit (INDEPENDENT_AMBULATORY_CARE_PROVIDER_SITE_OTHER): Payer: Medicare HMO | Admitting: Family Medicine

## 2016-04-24 VITALS — BP 132/79 | HR 78 | Temp 98.0°F | Resp 16 | Ht 71.0 in | Wt 200.2 lb

## 2016-04-24 DIAGNOSIS — M792 Neuralgia and neuritis, unspecified: Secondary | ICD-10-CM | POA: Diagnosis not present

## 2016-04-24 DIAGNOSIS — M436 Torticollis: Secondary | ICD-10-CM

## 2016-04-24 DIAGNOSIS — M62830 Muscle spasm of back: Secondary | ICD-10-CM | POA: Diagnosis not present

## 2016-04-24 DIAGNOSIS — M549 Dorsalgia, unspecified: Secondary | ICD-10-CM

## 2016-04-24 MED ORDER — CYCLOBENZAPRINE HCL 5 MG PO TABS: 5.0000 mg | ORAL_TABLET | Freq: Three times a day (TID) | ORAL | 1 refills | Status: DC | PRN

## 2016-04-24 MED ORDER — TRAMADOL HCL 50 MG PO TABS: 50.0000 mg | ORAL_TABLET | Freq: Three times a day (TID) | ORAL | 0 refills | Status: DC | PRN

## 2016-04-24 MED ORDER — PREDNISONE 20 MG PO TABS: 60.0000 mg | ORAL_TABLET | Freq: Every day | ORAL | 0 refills | Status: AC

## 2016-04-24 NOTE — Progress Notes (Signed)
Chief Complaint  Patient presents with  . Arm Pain    right arm pain x couple weeks getting worse    HPI   Pt reports that a few weeks ago he thought he pulled a muscle He reports that this morning he had a  He reports that he gets pain that from the trapezius into the bicep on the right He reports that he was lifting and thought he pulled a muscle a few weeks ago Now the right side is stiff and it is worse with standing or straighten his head If he flexes his neck it eases his pain compared to extending Standing also hurts as well No fevers or chills No sick contacts   Past Medical History:  Diagnosis Date  . Acid reflux OCCASIONALLY--  TAKE PREVACID  . Chronic cluster headache FOLLOWED AT [place]   PRN OXYGEN VIA [place] WHEN FEELS HEADACHE STARTING/  ON PREVENTION MEDS  . Hx of radiation therapy 12/28/11   prostate seed implant/75 seeds/,Dr.Dahlstedt  . Hyperlipidemia   . Prostate cancer ([place]) DX  10/11/11   Adenocarcinoma,gleason 3+3=6,PSA=5.39   volume=30cc  . PSA elevation    2009=3.4,2010=3.46,2011=3.16,2012=3.76,07/2011=4.85    Current Outpatient Prescriptions  Medication Sig Dispense Refill  . clonazePAM (KLONOPIN) 1 MG tablet Take 1 tablet (1 mg total) by mouth at bedtime. 30 tablet 5  . venlafaxine (EFFEXOR) 37.5 MG tablet Take 1 tablet (37.5 mg total) by mouth 2 (two) times daily with a meal. 60 tablet 6  . cyclobenzaprine (FLEXERIL) 5 MG tablet Take 1 tablet (5 mg total) by mouth 3 (three) times daily as needed for muscle spasms. 30 tablet 1  . predniSONE (DELTASONE) 20 MG tablet Take 3 tablets (60 mg total) by mouth daily with breakfast. 21 tablet 0  . traMADol (ULTRAM) 50 MG tablet Take 1 tablet (50 mg total) by mouth every 8 (eight) hours as needed. 30 tablet 0   No current facility-administered medications for this visit.     Allergies: No Known Allergies  Past Surgical History:  Procedure Laterality Date  . APPENDECTOMY  AGE 5  . PROSTATE  BIOPSY  10/11/11   gleason 3+3=6,PSA=5.39,Adenocarcinoma  . PROSTATE SURGERY    . RADIOACTIVE SEED IMPLANT  12/28/2011   Procedure: RADIOACTIVE SEED IMPLANT;  Surgeon: Franchot Gallo, MD;  Location: [facility];  Service: Urology;  Laterality: N/A;  75 seeds implanted  no seeds found in bladder    Social History   Social History  . Marital status: Married    Spouse name: N/A  . Number of children: 2  . Years of education: N/A   Occupational History  .      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 Smoking status: Former Smoker    Packs/day: 1.00    Years: 10.00    Types: Cigarettes    Quit date: 07/24/1984  . Smokeless tobacco: Former Systems developer    Types: Chew  . Alcohol use 3.6 - 4.8 oz/week    6 - 8 Standard drinks or equivalent per week     Comment: beer on weekends  . Drug use: No  . Sexual activity: Yes    Partners: Female   Other Topics Concern  . None   Social History Narrative  . None    ROS No fevers or chills No epigastric pain No jaw pain No diaphoresis No blurry vision No nausea or  vomiting  Objective: Vitals:   04/24/16 1205  BP: 132/79  Pulse: 78  Resp: 16  Temp: 98 F (36.7 C)  TempSrc: Oral  SpO2: 97%  Weight: 200 lb 3.2 oz (90.8 kg)  Height: 5\' 11"  (1.803 m)    Physical Exam  Constitutional: He appears well-developed and well-nourished.  HENT:  Head: Normocephalic and atraumatic.  Eyes: Conjunctivae and EOM are normal.  Cardiovascular: Normal rate, regular rhythm and normal heart sounds.   Pulmonary/Chest: Effort normal and breath sounds normal. No respiratory distress. He has no wheezes.  Musculoskeletal:       Cervical back: He exhibits normal range of motion, no tenderness and no bony tenderness.       Back:       Assessment and Plan Allen Burnett was seen today for arm pain.  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  Symptoms not concerning for infectious process or cardiac process  -     predniSONE (DELTASONE) 20 MG tablet; Take 3 tablets (60 mg total) by mouth daily with breakfast. -     cyclobenzaprine (FLEXERIL) 5 MG tablet; Take 1 tablet (5 mg total) by mouth 3 (three) times daily as needed for muscle spasms. -     traMADol (ULTRAM) 50 MG tablet; Take 1 tablet (50 mg total) by mouth every 8 (eight) hours as needed.     [place]

## 2016-04-24 NOTE — Patient Instructions (Addendum)
IF you received an x-ray today, you will receive an invoice from [facility] Rad[facility]. Please contact [facility]iology at [phone] with questions or concerns regarding your invoice.   IF you received labwork today, you will receive an invoice from [place]. Please contact [facility] at [phone] with questions or concerns regarding your invoice.   Our billing staff will not be able to assist you with questions regarding bills from these companies.  You will be contacted with the lab results as soon as they are available. The fastest way to get your results is to activate your My Chart account. Instructions are located on the last page of this paperwork. If you have not heard from [place] regarding the results in 2 weeks, please contact this office.     Pinched Nerve A pinched nerve is a type of injury that occurs when too much pressure is placed on a nerve. This pressure can cause pain, burning, and muscle weakness in places such as your arm, hand, back, leg, or neck. A nerve can become permanently damaged if it is severely pinched or if it has been pinched for a long time. What are the causes? This condition may be caused by:  The passing of a nerve through a narrow area between bones or other body structures.  Loss of blood supply to a nerve.  A nerve being stretched from an injury.  A sudden injury with swelling.  Wear and tear that occurs over several years.  Changes that occur in the spine with age. What are the signs or symptoms? The most common symptom of a pinched nerve is a tingling feeling or numbness. Other symptoms include:  Pain that radiates from the affected nerve to the body part that the nerve supplies.  A burning feeling.  Muscle weakness in the muscles supplied by the injured nerve. How is this diagnosed? This condition is diagnosed with a physical exam. During the exam, a health care provider will check for numbness and muscle weakness and move the  affected body parts to test for pain. You may also have other tests, such as:  X-rays to check for bone damage.  An MRI or CT scan to check for nerve damage.  Electromyography (EMG) to check for electrical signals passing through nerves to muscles. How is this treated? The first treatment for a pinched nerve is usually rest and supportive devices, such as a splint, brace, or neck collar. Additional treatment depends on symptoms and the amount of nerve damage. This can include:  Medicines, such as:  Numbing medicine injections.  Nonsteroidal anti-inflammatory drugs (NSAIDs).  Steroid medicines in pill form or by injection.  Physical therapy to relieve pain, maintain movement, and improve muscle strength.  Surgery. This may be done if other treatments do not work. Follow these instructions at home:  Only take medicines as directed by your health care provider.  Wear supportive or protective devices as directed by your health care provider.  Do stretching and strengthening exercises at home as directed by your health care provider.  Rest as needed.  Keep all follow-up visits as directed by your health care provider. This is important. Contact a health care provider if:  Your condition does not improve with treatment.  Your pain, numbness, or weakness suddenly gets worse. This information is not intended to replace advice given to you by your health care provider. Make sure you discuss any questions you have with your health care provider. Document Released: 01/13/2002 Document Revised: 07/01/2015 Document  Reviewed: 10/29/2013 Elsevier Interactive Patient Education  2017 [employer].

## 2016-04-27 DIAGNOSIS — D485 Neoplasm of uncertain behavior of skin: Secondary | ICD-10-CM | POA: Diagnosis not present

## 2016-04-27 DIAGNOSIS — D225 Melanocytic nevi of trunk: Secondary | ICD-10-CM | POA: Diagnosis not present

## 2016-05-09 ENCOUNTER — Encounter (HOSPITAL_COMMUNITY): Payer: Medicare HMO

## 2016-06-14 ENCOUNTER — Telehealth: Payer: Self-pay | Admitting: Family Medicine

## 2016-06-14 NOTE — Telephone Encounter (Signed)
Pt is needing a refill on venlafaxine 75 mg he states that the 37.5 mg is not working   [employer] number is [phone]

## 2016-06-16 NOTE — Telephone Encounter (Signed)
Last seen 3/18, do you want ov to increase?

## 2016-06-22 MED ORDER — VENLAFAXINE HCL 75 MG PO TABS: 75.0000 mg | ORAL_TABLET | Freq: Two times a day (BID) | ORAL | 3 refills | Status: DC

## 2016-06-22 NOTE — Telephone Encounter (Signed)
Let the patient know that if he still does not get much of a response at the increased dose to return to the office.  Give it six weeks to see a full response.

## 2016-09-20 ENCOUNTER — Telehealth: Payer: Self-pay | Admitting: Family Medicine

## 2016-09-20 NOTE — Telephone Encounter (Signed)
PT CALLING FOR PREDNISONE HE'S HAVING CLUSTER HEADACHES AND STATES THAT THIS ALWAYS HELP  HE STATES THAT DR STALLING KNOWS ABOUT HIS HEADACHES HES NOT ABLE TO FUNCTION WELL AND THEY HURT BADLY

## 2016-09-22 NOTE — Telephone Encounter (Signed)
Pt advised to schedule an office visit

## 2016-09-26 DIAGNOSIS — R69 Illness, unspecified: Secondary | ICD-10-CM | POA: Diagnosis not present

## 2016-09-27 DIAGNOSIS — Z0000 Encounter for general adult medical examination without abnormal findings: Secondary | ICD-10-CM | POA: Diagnosis not present

## 2016-09-27 DIAGNOSIS — Z6827 Body mass index (BMI) 27.0-27.9, adult: Secondary | ICD-10-CM | POA: Diagnosis not present

## 2016-10-12 ENCOUNTER — Other Ambulatory Visit: Payer: Self-pay | Admitting: Family Medicine

## 2016-10-13 NOTE — Telephone Encounter (Signed)
Please advise 

## 2016-10-15 NOTE — Telephone Encounter (Signed)
Please phone in medication

## 2016-10-16 DIAGNOSIS — G44009 Cluster headache syndrome, unspecified, not intractable: Secondary | ICD-10-CM | POA: Diagnosis not present

## 2016-10-18 ENCOUNTER — Telehealth: Payer: Self-pay

## 2016-10-18 NOTE — Telephone Encounter (Signed)
Rx for clonazepam 1 mg # 30 with 5 refills sent via fax to [phone].  Confirmation fax received at 4:54 pm. Dgaddy, CMA

## 2016-10-23 NOTE — Telephone Encounter (Signed)
Rx faxed over to pharmacy see note Dgaddy, cma

## 2016-11-19 ENCOUNTER — Other Ambulatory Visit: Payer: Self-pay | Admitting: Family Medicine

## 2016-12-13 DIAGNOSIS — R69 Illness, unspecified: Secondary | ICD-10-CM | POA: Diagnosis not present

## 2016-12-26 DIAGNOSIS — R69 Illness, unspecified: Secondary | ICD-10-CM | POA: Diagnosis not present

## 2017-01-03 DIAGNOSIS — R69 Illness, unspecified: Secondary | ICD-10-CM | POA: Diagnosis not present

## 2017-01-11 DIAGNOSIS — M542 Cervicalgia: Secondary | ICD-10-CM | POA: Diagnosis not present

## 2017-01-11 DIAGNOSIS — R51 Headache: Secondary | ICD-10-CM | POA: Diagnosis not present

## 2017-01-11 DIAGNOSIS — G44009 Cluster headache syndrome, unspecified, not intractable: Secondary | ICD-10-CM | POA: Diagnosis not present

## 2017-01-11 DIAGNOSIS — M791 Myalgia, unspecified site: Secondary | ICD-10-CM | POA: Diagnosis not present

## 2017-01-24 ENCOUNTER — Encounter: Payer: Self-pay | Admitting: Family Medicine

## 2017-01-24 DIAGNOSIS — Z1211 Encounter for screening for malignant neoplasm of colon: Secondary | ICD-10-CM | POA: Diagnosis not present

## 2017-01-24 DIAGNOSIS — Z800 Family history of malignant neoplasm of digestive organs: Secondary | ICD-10-CM | POA: Diagnosis not present

## 2017-02-19 ENCOUNTER — Other Ambulatory Visit: Payer: Self-pay

## 2017-02-19 ENCOUNTER — Ambulatory Visit (INDEPENDENT_AMBULATORY_CARE_PROVIDER_SITE_OTHER): Payer: Medicare HMO | Admitting: Family Medicine

## 2017-02-19 ENCOUNTER — Encounter: Payer: Self-pay | Admitting: Family Medicine

## 2017-02-19 VITALS — BP 120/64 | HR 75 | Temp 99.0°F | Resp 16 | Ht 71.0 in | Wt 206.6 lb

## 2017-02-19 DIAGNOSIS — Z0000 Encounter for general adult medical examination without abnormal findings: Secondary | ICD-10-CM | POA: Diagnosis not present

## 2017-02-19 DIAGNOSIS — E7800 Pure hypercholesterolemia, unspecified: Secondary | ICD-10-CM

## 2017-02-19 DIAGNOSIS — R350 Frequency of micturition: Secondary | ICD-10-CM

## 2017-02-19 DIAGNOSIS — Z125 Encounter for screening for malignant neoplasm of prostate: Secondary | ICD-10-CM | POA: Diagnosis not present

## 2017-02-19 LAB — POCT URINALYSIS DIP (MANUAL ENTRY)
BILIRUBIN UA: NEGATIVE mg/dL
Bilirubin, UA: NEGATIVE
Blood, UA: NEGATIVE
Glucose, UA: NEGATIVE mg/dL
LEUKOCYTES UA: NEGATIVE
NITRITE UA: NEGATIVE
PROTEIN UA: NEGATIVE mg/dL
Spec Grav, UA: 1.025 (ref 1.010–1.025)
UROBILINOGEN UA: 0.2 U/dL
pH, UA: 6 (ref 5.0–8.0)

## 2017-02-19 MED ORDER — VENLAFAXINE HCL 75 MG PO TABS: 75.0000 mg | ORAL_TABLET | Freq: Two times a day (BID) | ORAL | 3 refills | Status: DC

## 2017-02-19 NOTE — Progress Notes (Signed)
QUICK REFERENCE INFORMATION: The ABCs of Providing the Annual Wellness Visit  CMS.gov Medicare Learning Network  [employer] Annual Wellness Visit  Subjective:   Allen Burnett is a 67 y.o. Male who presents for an Annual Wellness Visit.   Patient Active Problem List   Diagnosis Date Noted  . Right leg pain 10/07/2015  . Insomnia 10/29/2013  . Melanoma of skin, site unspecified 10/29/2013  . PVC (premature ventricular contraction) 08/06/2013  . Cluster headache 10/04/2012  . Hyperlipidemia 10/02/2012  . Prostate cancer ([place]) 11/08/2011    Past Medical History:  Diagnosis Date  . Acid reflux OCCASIONALLY--  TAKE PREVACID  . Chronic cluster headache FOLLOWED AT [place]   PRN OXYGEN VIA [place] WHEN FEELS HEADACHE STARTING/  ON PREVENTION MEDS  . Hx of radiation therapy 12/28/11   prostate seed implant/75 seeds/,Dr.Dahlstedt  . Hyperlipidemia   . Prostate cancer ([place]) DX  10/11/11   Adenocarcinoma,gleason 3+3=6,PSA=5.39   volume=30cc  . PSA elevation    2009=3.4,2010=3.46,2011=3.16,2012=3.76,07/2011=4.85     Past Surgical History:  Procedure Laterality Date  . APPENDECTOMY  AGE 9  . PROSTATE BIOPSY  10/11/11   gleason 3+3=6,PSA=5.39,Adenocarcinoma  . PROSTATE SURGERY    . RADIOACTIVE SEED IMPLANT  12/28/2011   Procedure: RADIOACTIVE SEED IMPLANT;  Surgeon: Franchot Gallo, MD;  Location: [facility];  Service: Urology;  Laterality: N/A;  75 seeds implanted  no seeds found in bladder     Outpatient Medications Prior to Visit  Medication Sig Dispense Refill  . clonazePAM (KLONOPIN) 1 MG tablet take 1 tablet by mouth at bedtime 30 tablet 5  . verapamil (VERELAN PM) 120 MG 24 hr capsule Take 120 mg by mouth 2 (two) times daily.    Marland Kitchen venlafaxine (EFFEXOR) 75 MG tablet take 1 tablet by mouth twice a day with food 60 tablet 0  . cyclobenzaprine (FLEXERIL) 5 MG tablet Take 1 tablet (5 mg total) by mouth 3 (three) times daily as needed for muscle spasms.  (Patient not taking: Reported on 02/19/2017) 30 tablet 1   No facility-administered medications prior to visit.     No Known Allergies   Family History  Problem Relation Age of Onset  . Cancer Brother 64       colon cancer now age 63  . Hyperlipidemia Mother   . Heart disease Maternal Grandfather   . Cancer Brother      Social History   Socioeconomic History  . Marital status: Married    Spouse name: None  . Number of children: 2  . Years of education: None  . Highest education level: None  Social Needs  . Financial resource strain: None  . Food insecurity - worry: None  . Food insecurity - inability: None  . Transportation needs - medical: None  . Transportation needs - non-medical: None  Occupational History    Comment: advertising sales/ retired  . Occupation: golf [employer]    Comment: Part time  Tobacco Use  . Smoking status: Former Smoker    Packs/day: 1.00    Years: 10.00    Pack years: 10.00    Types: Cigarettes    Last attempt to quit: 07/24/1984    Years since quitting: 32.5  . Smokeless tobacco: Former Systems developer    Types: Chew  Substance and Sexual Activity  . Alcohol use: Yes    Alcohol/week: 3.6 - 4.8 oz    Types: 6 - 8 Standard drinks or equivalent per week    Comment: beer on  weekends  . Drug use: No  . Sexual activity: Yes    Partners: Female  Other Topics Concern  . None  Social History Narrative  . None      Recent Hospitalizations? no  Health Habits: Current exercise activities include: walking Exercise: 4 times/week. Diet: in general, a "healthy" diet    Alcohol intake: drinks beer on weekends  Health Risk Assessment: The patient has completed a Health Risk Assessment. This has been reveiwed with them and has been scanned into the [place] system as an attached document.  Current Medical Providers and Suppliers: [facility] Patient Care Team: Forrest Moron, MD as PCP - General (Internal Medicine) No future  appointments.   Age-appropriate Screening Schedule: The list below includes current immunization status and future screening recommendations based on patient's age. Orders for these recommended tests are listed in the plan section. The patient has been provided with a written plan. Immunization History  Administered Date(s) Administered  . Influenza-Unspecified 12/06/2015, 12/13/2016  . Pneumococcal Conjugate-13 10/29/2013  . Pneumococcal Polysaccharide-23 03/15/2016    Health Maintenance reviewed -  Up to date Reviewed vaccines, hep c screen, psa and colonoscopy Lipids and vision    Depression Screen-PHQ2/9 completed today  Depression screen [facility] 2/9 02/19/2017 04/24/2016 04/24/2016 03/27/2016 03/15/2016  Decreased Interest 0 0 0 0 0  Down, Depressed, Hopeless 0 0 0 0 0  PHQ - 2 Score 0 0 0 0 0    Depression Severity and Treatment Recommendations:  0-4= None  5-9= Mild / Treatment: Support, educate to call if worse; return in one month  10-14= Moderate / Treatment: Support, watchful waiting; Antidepressant or Psycotherapy  15-19= Moderately severe / Treatment: Antidepressant OR Psychotherapy  >= 20 = Major depression, severe / Antidepressant AND Psychotherapy  Functional Status Survey: Is the patient deaf or have difficulty hearing?: No Does the patient have difficulty seeing, even when wearing glasses/contacts?: Yes(per pt doesn't see as well as he use to-see vision screen) Does the patient have difficulty concentrating, remembering, or making decisions?: No Does the patient have difficulty walking or climbing stairs?: No Does the patient have difficulty dressing or bathing?: No Does the patient have difficulty doing errands alone such as visiting a doctor's office or shopping?: No  Hearing Evaluation: 1. Do you have trouble hearing the television when others do not? no 2. Do you have to strain to hear/understand conversations? no   Advanced Care Planning: 1. Patient has  executed an Advance Directive: no  2. If no, patient was given the opportunity to execute an Advance Directive today? yes  3. Are the patient's advanced directives in [place]? no 4. This patient has the ability to prepare an Advance Directive: yes 5. Provider is willing to follow the patient's wishes: yes  Cognitive Assessment: Does the patient have evidence of cognitive impairment? no The patient does not have any evidence of any cognitive problems and denies any  change in mood/affect, appearance, speech, memory or motor skills.  Identification of Risk Factors: Risk factors include:   Review of Systems  Constitutional: Negative for chills and fever.  Eyes: Negative for blurred vision and double vision.  Respiratory: Negative for cough, shortness of breath and wheezing.   Cardiovascular: Negative for chest pain, palpitations and leg swelling.  Gastrointestinal: Negative for abdominal pain, nausea and vomiting.  Genitourinary: Negative for dysuria and urgency.  Musculoskeletal: Negative for myalgias and neck pain.  Skin: Negative for itching and rash.  Neurological: Negative for dizziness, tingling and headaches.  Psychiatric/Behavioral: Negative  for depression. The patient is not nervous/anxious.     Objective:   Vitals:   02/19/17 0805  BP: 120/64  Pulse: 75  Resp: 16  Temp: 99 F (37.2 C)  TempSrc: Oral  SpO2: 97%  Weight: 206 lb 9.6 oz (93.7 kg)  Height: 5\' 11"  (1.803 m)   Wt Readings from Last 3 Encounters:  02/19/17 206 lb 9.6 oz (93.7 kg)  04/24/16 200 lb 3.2 oz (90.8 kg)  04/14/16 199 lb (90.3 kg)    Body mass index is 28.81 kg/m.  General appearance: alert, cooperative and appears stated age Head: Normocephalic, without obvious abnormality, atraumatic Eyes: conjunctivae/corneas clear. PERRL, EOM's intact. Fundi benign. Ears: normal TM's and external ear canals both ears Lungs: clear to auscultation bilaterally Heart: regular rate and rhythm, S1, S2  normal, no murmur, click, rub or gallop Abdomen: soft, non-tender; bowel sounds normal; no masses,  no organomegaly Skin: Skin color, texture, turgor normal. No rashes or lesions    Assessment/Plan:   Patient Self-Management and Personalized Health Advice The patient has been provided with information about: {begin progressive daily aerobic exercise program, attempt to lose weight, reduce exposure to stress and return for routine annual checkups  During the course of the visit the patient was educated and counseled about appropriate screening and preventive services including:    return annually or prn   Body mass index is 28.81 kg/m. Discussed the patient's BMI with him. The BMI BMI is not in the acceptable range; BMI management plan is completed  Rami was seen today for annual exam.  Diagnoses and all orders for this visit:  Encounter for Medicare annual wellness exam- reviewed and advised pt to get eye exam due to decreased visual acuity Reviewed vaccines And discussed cancer screening  Elevated LDL cholesterol level -     Lipid panel -     Comprehensive metabolic panel  Urinary frequency -     POCT urinalysis dipstick  Special screening for malignant neoplasm of prostate- pt agreed to PSA -     PSA  Other orders -     venlafaxine (EFFEXOR) 75 MG tablet; Take 1 tablet (75 mg total) by mouth 2 (two) times daily with a meal.      Return in about 6 months (around 08/19/2017) for medication check.  No future appointments.  Patient Instructions  Urinary Frequency, Adult Urinary frequency means urinating more often than usual. People with urinary frequency urinate at least 8 times in 24 hours, even if they drink a normal amount of fluid. Although they urinate more often than normal, the total amount of urine produced in a day may be normal. Urinary frequency is also called pollakiuria. What are the causes? This condition may be caused by:  A urinary tract  infection.  Obesity.  Bladder problems, such as bladder stones.  Caffeine or alcohol.  Eating food or drinking fluids that irritate the bladder. These include coffee, tea, soda, artificial sweeteners, citrus, tomato-based foods, and chocolate.  Certain medicines, such as medicines that help the body get rid of extra fluid (diuretics).  Muscle or nerve weakness.  Overactive bladder.  Chronic diabetes.  Interstitial cystitis.  In men, problems with the prostate, such as an enlarged prostate.  In women, pregnancy.  In some cases, the cause may not be known. What increases the risk? This condition is more likely to develop in:  Women who have gone through menopause.  Men with prostate problems.  People with a disease or injury that affects  the nerves or spinal cord.  People who have or have had a condition that affects the brain, such as a stroke.  What are the signs or symptoms? Symptoms of this condition include:  Feeling an urgent need to urinate often. The stress and anxiety of needing to find a bathroom quickly can make this urge worse.  Urinating 8 or more times in 24 hours.  Urinating as often as every 1 to 2 hours.  How is this diagnosed? This condition is diagnosed based on your symptoms, your medical history, and a physical exam. You may have tests, such as:  Blood tests.  Urine tests.  Imaging tests, such as X-rays or ultrasounds.  A bladder test.  A test of your neurological system. This is the body system that senses the need to urinate.  A test to check for problems in the urethra and bladder called cystoscopy.  You may also be asked to keep a bladder diary. A bladder diary is a record of what you eat and drink, how often you urinate, and how much you urinate. You may need to see a health care provider who specializes in conditions of the urinary tract (urologist) or kidneys (nephrologist). How is this treated? Treatment for this condition  depends on the cause. Sometimes the condition goes away on its own and treatment is not necessary. If treatment is needed, it may include:  Taking medicine.  Learning exercises that strengthen the muscles that help control urination.  Following a bladder training program. This may include: ? Learning to delay going to the bathroom. ? Double urinating (voiding). This helps if you are not completely emptying your bladder. ? Scheduled voiding.  Making diet changes, such as: ? Avoiding caffeine. ? Drinking fewer fluids, especially alcohol. ? Not drinking in the evening. ? Not having foods or drinks that may irritate the bladder. ? Eating foods that help prevent or ease constipation. Constipation can make this condition worse.  Having the nerves in your bladder stimulated. There are two options for stimulating the nerves to your bladder: ? Outpatient electrical nerve stimulation. This is done by your health care provider. ? Surgery to implant a bladder pacemaker. The pacemaker helps to control the urge to urinate.  Follow these instructions at home:  Keep a bladder diary if told to by your health care provider.  Take over-the-counter and prescription medicines only as told by your health care provider.  Do any exercises as told by your health care provider.  Follow a bladder training program as told by your health care provider.  Make any recommended diet changes.  Keep all follow-up visits as told by your health care provider. This is important. Contact a health care provider if:  You start urinating more often.  You feel pain or irritation when you urinate.  You notice blood in your urine.  Your urine looks cloudy.  You develop a fever.  You begin vomiting. Get help right away if:  You are unable to urinate. This information is not intended to replace advice given to you by your health care provider. Make sure you discuss any questions you have with your health care  provider. Document Released: 11/19/2008 Document Revised: 02/24/2015 Document Reviewed: 08/19/2014 Elsevier Interactive Patient Education  [employer].    An after visit summary with all of these plans was given to the patient.

## 2017-02-19 NOTE — Patient Instructions (Addendum)

## 2017-02-20 LAB — COMPREHENSIVE METABOLIC PANEL
A/G RATIO: 1.6 (ref 1.2–2.2)
ALT: 17 IU/L (ref 0–44)
AST: 18 IU/L (ref 0–40)
Albumin: 3.9 g/dL (ref 3.6–4.8)
Alkaline Phosphatase: 60 IU/L (ref 39–117)
BILIRUBIN TOTAL: 0.3 mg/dL (ref 0.0–1.2)
BUN / CREAT RATIO: 21 (ref 10–24)
BUN: 20 mg/dL (ref 8–27)
CALCIUM: 9 mg/dL (ref 8.6–10.2)
CO2: 20 mmol/L (ref 20–29)
Chloride: 107 mmol/L — ABNORMAL HIGH (ref 96–106)
Creatinine, Ser: 0.97 mg/dL (ref 0.76–1.27)
GFR, EST AFRICAN AMERICAN: 94 mL/min/{1.73_m2} (ref >59)
GFR, EST NON AFRICAN AMERICAN: 82 mL/min/{1.73_m2} (ref >59)
GLOBULIN, TOTAL: 2.5 g/dL (ref 1.5–4.5)
Glucose: 95 mg/dL (ref 65–99)
POTASSIUM: 4.6 mmol/L (ref 3.5–5.2)
Sodium: 141 mmol/L (ref 134–144)
TOTAL PROTEIN: 6.4 g/dL (ref 6.0–8.5)

## 2017-02-20 LAB — PSA: Prostate Specific Ag, Serum: 0.2 ng/mL (ref 0.0–4.0)

## 2017-02-20 LAB — LIPID PANEL
CHOL/HDL RATIO: 3.3 ratio (ref 0.0–5.0)
CHOLESTEROL TOTAL: 225 mg/dL — AB (ref 100–199)
HDL: 68 mg/dL (ref >39)
LDL Calculated: 142 mg/dL — ABNORMAL HIGH (ref 0–99)
TRIGLYCERIDES: 77 mg/dL (ref 0–149)
VLDL Cholesterol Cal: 15 mg/dL (ref 5–40)

## 2017-02-21 ENCOUNTER — Encounter: Payer: Self-pay | Admitting: Family Medicine

## 2017-03-01 DIAGNOSIS — H5200 Hypermetropia, unspecified eye: Secondary | ICD-10-CM | POA: Diagnosis not present

## 2017-03-19 DIAGNOSIS — R69 Illness, unspecified: Secondary | ICD-10-CM | POA: Diagnosis not present

## 2017-06-16 ENCOUNTER — Other Ambulatory Visit: Payer: Self-pay | Admitting: Family Medicine

## 2017-06-18 NOTE — Telephone Encounter (Signed)
Patient is requesting a refill of the following medications: Requested Prescriptions   Pending Prescriptions Disp Refills  . clonazePAM (KLONOPIN) 1 MG tablet [Pharmacy Med Name: CLONAZEPAM 1MG  TABLETS] 30 tablet 0    Sig: TAKE 1 TABLET BY MOUTH AT BEDTIME   Date of patient request: 06/16/2017 Last office visit: 02/19/2017 Date of last refill:10/15/2016 Last refill amount: 30 Follow up time period per chart: n/a  Please advise. Dgaddy, CMA

## 2017-06-18 NOTE — Telephone Encounter (Signed)
Rx request klonipin  LOV 02-19-2017 Dr Nolon Rod   Last filled 10-15-2016  30 tabs   Pharmacy on file

## 2017-10-02 DIAGNOSIS — Z0100 Encounter for examination of eyes and vision without abnormal findings: Secondary | ICD-10-CM | POA: Diagnosis not present

## 2017-11-15 DIAGNOSIS — L080 Pyoderma: Secondary | ICD-10-CM | POA: Diagnosis not present

## 2017-11-15 DIAGNOSIS — L570 Actinic keratosis: Secondary | ICD-10-CM | POA: Diagnosis not present

## 2017-11-15 DIAGNOSIS — D485 Neoplasm of uncertain behavior of skin: Secondary | ICD-10-CM | POA: Diagnosis not present

## 2017-11-15 DIAGNOSIS — D0439 Carcinoma in situ of skin of other parts of face: Secondary | ICD-10-CM | POA: Diagnosis not present

## 2017-11-15 DIAGNOSIS — D229 Melanocytic nevi, unspecified: Secondary | ICD-10-CM | POA: Diagnosis not present

## 2017-11-15 DIAGNOSIS — Z85820 Personal history of malignant melanoma of skin: Secondary | ICD-10-CM | POA: Diagnosis not present

## 2018-02-05 ENCOUNTER — Other Ambulatory Visit: Payer: Self-pay | Admitting: Family Medicine

## 2018-02-05 MED ORDER — VENLAFAXINE HCL 75 MG PO TABS: 75.0000 mg | ORAL_TABLET | Freq: Two times a day (BID) | ORAL | 0 refills | Status: DC

## 2018-02-05 NOTE — Telephone Encounter (Signed)
Copied from [place] [phone]. Topic: Quick Communication - Rx Refill/Question >> Feb 05, 2018  4:43 PM Alfredia Ferguson R wrote: Medication: venlafaxine (EFFEXOR) 75 MG tablet  Has the patient contacted their pharmacy?Yes  Preferred Pharmacy (with phone number or street name): [facility], [address] [facility] [address] [place] [phone] (Phone) [phone] (Fax)    Agent: Please be advised that RX refills may take up to 3 business days. We ask that you follow-up with your pharmacy.

## 2018-02-05 NOTE — Telephone Encounter (Signed)
Courtesy refill until appointment 04/09/18.

## 2018-04-04 DIAGNOSIS — G44009 Cluster headache syndrome, unspecified, not intractable: Secondary | ICD-10-CM | POA: Diagnosis not present

## 2018-04-09 ENCOUNTER — Other Ambulatory Visit: Payer: Self-pay

## 2018-04-09 ENCOUNTER — Encounter: Payer: Self-pay | Admitting: Family Medicine

## 2018-04-09 ENCOUNTER — Ambulatory Visit (INDEPENDENT_AMBULATORY_CARE_PROVIDER_SITE_OTHER): Payer: Medicare HMO | Admitting: Family Medicine

## 2018-04-09 VITALS — BP 138/82 | HR 57 | Temp 97.7°F | Resp 18 | Ht 71.0 in | Wt 200.4 lb

## 2018-04-09 DIAGNOSIS — Z0001 Encounter for general adult medical examination with abnormal findings: Secondary | ICD-10-CM

## 2018-04-09 DIAGNOSIS — R69 Illness, unspecified: Secondary | ICD-10-CM | POA: Diagnosis not present

## 2018-04-09 DIAGNOSIS — E7800 Pure hypercholesterolemia, unspecified: Secondary | ICD-10-CM | POA: Diagnosis not present

## 2018-04-09 DIAGNOSIS — Z8546 Personal history of malignant neoplasm of prostate: Secondary | ICD-10-CM | POA: Diagnosis not present

## 2018-04-09 DIAGNOSIS — F411 Generalized anxiety disorder: Secondary | ICD-10-CM | POA: Diagnosis not present

## 2018-04-09 DIAGNOSIS — Z0000 Encounter for general adult medical examination without abnormal findings: Secondary | ICD-10-CM

## 2018-04-09 DIAGNOSIS — G44019 Episodic cluster headache, not intractable: Secondary | ICD-10-CM

## 2018-04-09 MED ORDER — VENLAFAXINE HCL 75 MG PO TABS: 75.0000 mg | ORAL_TABLET | Freq: Two times a day (BID) | ORAL | 3 refills | Status: DC

## 2018-04-09 NOTE — Progress Notes (Signed)
QUICK REFERENCE INFORMATION: The ABCs of Providing the Annual Wellness Visit  CMS.gov Medicare Learning Network  [employer] Annual Wellness Visit  Subjective:   Allen Burnett is a 68 y.o. Male who presents for an Annual Wellness Visit.  Patient Active Problem List   Diagnosis Date Noted  . Right leg pain 10/07/2015  . Insomnia 10/29/2013  . Melanoma of skin, site unspecified 10/29/2013  . PVC (premature ventricular contraction) 08/06/2013  . Cluster headache 10/04/2012  . Hyperlipidemia 10/02/2012  . Prostate cancer ([place]) 11/08/2011    Past Medical History:  Diagnosis Date  . Acid reflux OCCASIONALLY--  TAKE PREVACID  . Chronic cluster headache FOLLOWED AT [facility]   PRN OXYGEN VIA [place] WHEN FEELS HEADACHE STARTING/  ON PREVENTION MEDS  . Hx of radiation therapy 12/28/11   prostate seed implant/75 seeds/,Dr.Dahlstedt  . Hyperlipidemia   . Prostate cancer ([place]) DX  10/11/11   Adenocarcinoma,gleason 3+3=6,PSA=5.39   volume=30cc  . PSA elevation    2009=3.4,2010=3.46,2011=3.16,2012=3.76,07/2011=4.85     Past Surgical History:  Procedure Laterality Date  . APPENDECTOMY  AGE 69  . PROSTATE BIOPSY  10/11/11   gleason 3+3=6,PSA=5.39,Adenocarcinoma  . PROSTATE SURGERY    . RADIOACTIVE SEED IMPLANT  12/28/2011   Procedure: RADIOACTIVE SEED IMPLANT;  Surgeon: Franchot Gallo, MD;  Location: [facility];  Service: Urology;  Laterality: N/A;  75 seeds implanted  no seeds found in bladder     Outpatient Medications Prior to Visit  Medication Sig Dispense Refill  . predniSONE (DELTASONE) 10 MG tablet     . SUMAtriptan (IMITREX) 6 MG/0.5ML SOLN injection     . venlafaxine (EFFEXOR) 75 MG tablet Take 1 tablet (75 mg total) by mouth 2 (two) times daily with a meal. 126 tablet 0  . clonazePAM (KLONOPIN) 1 MG tablet TAKE 1 TABLET BY MOUTH AT BEDTIME (Patient not taking: Reported on 04/09/2018) 30 tablet 0  . cyclobenzaprine (FLEXERIL) 5 MG tablet Take 1 tablet  (5 mg total) by mouth 3 (three) times daily as needed for muscle spasms. (Patient not taking: Reported on 02/19/2017) 30 tablet 1  . verapamil (VERELAN PM) 120 MG 24 hr capsule Take 120 mg by mouth 2 (two) times daily.     No facility-administered medications prior to visit.     No Known Allergies   Family History  Problem Relation Age of Onset  . Cancer Brother 70       colon cancer now age 47  . Hyperlipidemia Mother   . Heart disease Maternal Grandfather   . Cancer Brother      Social History   Socioeconomic History  . Marital status: Married    Spouse name: Not on file  . Number of children: 2  . Years of education: Not on file  . Highest education level: Not on file  Occupational History    Comment: advertising sales/ retired  . Occupation: golf [employer]    Comment: Part time  Social Needs  . Financial resource strain: Not on file  . Food insecurity:    Worry: Not on file    Inability: Not on file  . Transportation needs:    Medical: Not on file    Non-medical: Not on file  Tobacco Use  . Smoking status: Former Smoker    Packs/day: 1.00    Years: 10.00    Pack years: 10.00    Types: Cigarettes    Last attempt to quit: 07/24/1984    Years since quitting: 33.7  .  Smokeless tobacco: Former Systems developer    Types: Chew  Substance and Sexual Activity  . Alcohol use: Yes    Alcohol/week: 6.0 - 8.0 standard drinks    Types: 6 - 8 Standard drinks or equivalent per week    Comment: beer on weekends  . Drug use: No  . Sexual activity: Yes    Partners: Female  Lifestyle  . Physical activity:    Days per week: Not on file    Minutes per session: Not on file  . Stress: Not on file  Relationships  . Social connections:    Talks on phone: Not on file    Gets together: Not on file    Attends religious service: Not on file    Active member of club or organization: Not on file    Attends meetings of clubs or organizations: Not on file    Relationship status: Not on  file  Other Topics Concern  . Not on file  Social History Narrative  . Not on file      Recent Hospitalizations? No  Health Habits: Current exercise activities include: aerobics Exercise: 3 times/week. Diet: in general, a "healthy" diet    Alcohol intake:   Health Risk Assessment: The patient has completed a Health Risk Assessment. This has been reveiwed with them and has been scanned into the [facility] system as an attached document.  Current Medical Providers and Suppliers: [facility] Patient Care Team: Forrest Moron, MD as PCP - General (Internal Medicine) No future appointments.   Age-appropriate Screening Schedule: The list below includes current immunization status and future screening recommendations based on patient's age. Orders for these recommended tests are listed in the plan section. The patient has been provided with a written plan. Immunization History  Administered Date(s) Administered  . Influenza-Unspecified 12/06/2015, 12/13/2016  . Pneumococcal Conjugate-13 10/29/2013  . Pneumococcal Polysaccharide-23 03/15/2016    Health Maintenance reviewed -  Shingles    Depression Screen-PHQ2/9 completed today  Depression screen [facility] 2/9 04/09/2018 02/19/2017 04/24/2016 04/24/2016 03/27/2016  Decreased Interest 0 0 0 0 0  Down, Depressed, Hopeless 0 0 0 0 0  PHQ - 2 Score 0 0 0 0 0       Depression Severity and Treatment Recommendations:  0-4= None  5-9= Mild / Treatment: Support, educate to call if worse; return in one month  10-14= Moderate / Treatment: Support, watchful waiting; Antidepressant or Psycotherapy  15-19= Moderately severe / Treatment: Antidepressant OR Psychotherapy  >= 20 = Major depression, severe / Antidepressant AND Psychotherapy  Functional Status Survey:   Is the patient deaf or have difficulty hearing?: No Does the patient have difficulty seeing, even when wearing glasses/contacts?: No Does the patient have difficulty concentrating,  remembering, or making decisions?: No Does the patient have difficulty walking or climbing stairs?: No Does the patient have difficulty dressing or bathing?: No Does the patient have difficulty doing errands alone such as visiting a doctor's office or shopping?: No   Advanced Care Planning: 1. Patient has executed an Advance Directive: no 2. If no, patient was given the opportunity to execute an Advance Directive today? No 3. Are the patient's advanced directives in [place]? No 4. This patient has the ability to prepare an Advance Directive: No 5. Provider is willing to follow the patient's wishes: No  Cognitive Assessment: Does the patient have evidence of cognitive impairment? No The patient does not have any evidence of any cognitive problems and denies any  change in mood/affect, appearance, speech, memory  or motor skills.  Identification of Risk Factors: Risk factors include: hyperlipidemia  ROS  Review of Systems  Constitutional: Negative for activity change, appetite change, chills and fever.  HENT: Negative for congestion, nosebleeds, trouble swallowing and voice change.   Respiratory: Negative for cough, shortness of breath and wheezing.   Gastrointestinal: Negative for diarrhea, nausea and vomiting.  Genitourinary: Negative for difficulty urinating, dysuria, flank pain and hematuria.  Musculoskeletal: Negative for back pain, joint swelling and neck pain.  Neurological: Negative for dizziness, speech difficulty, light-headedness and numbness.  See HPI. All other review of systems negative.    Objective:   Vitals:   04/09/18 0833  BP: (!) 145/82  Pulse: (!) 57  Resp: 18  Temp: 97.7 F (36.5 C)  TempSrc: Oral  SpO2: 95%  Weight: 200 lb 6.4 oz (90.9 kg)  Height: '5\' 11"'$  (1.803 m)    Body mass index is 27.95 kg/m.   Physical Exam  Constitutional: Oriented to person, place, and time. Appears well-developed and well-nourished.  HENT:  Head: Normocephalic and  atraumatic.  Eyes: Conjunctivae and EOM are normal.  Cardiovascular: Normal rate, regular rhythm, normal heart sounds and intact distal pulses.  No murmur heard. Pulmonary/Chest: Effort normal and breath sounds normal. No stridor. No respiratory distress. Has no wheezes.  Abdomen: nondistended, normoactive bs, soft, nontender Rectal exam with chaperone: normal rectal tone, prostate size normal, External  Hemorrhoid noted Neurological: Is alert and oriented to person, place, and time.  Skin: Skin is warm. Capillary refill takes less than 2 seconds.  Psychiatric: Has a normal mood and affect. Behavior is normal. Judgment and thought content normal.     Assessment/Plan:   Patient Self-Management and Personalized Health Advice The patient has been provided with information about:  begin progressive daily aerobic exercise program, follow a low fat, low cholesterol diet, attempt to lose weight and decrease or avoid alcohol intake  During the course of the visit the patient was educated and counseled about appropriate screening and preventive services including:   lab testing as noted in orders section, return annually or prn     Body mass index is 27.95 kg/m. Discussed the patient's BMI with him. The BMI BMI is not in the acceptable range; BMI management plan is completed  Allen Burnett was seen today for annual exam.  Diagnoses and all orders for this visit:  Encounter for Medicare annual wellness exam-  Health Maintenance Plan Advised monthly scrotal exam Advised dental exam every six months Discussed stress management Discussed cancer screening   Elevated LDL cholesterol level -     Lipid panel -     TSH -     CMP14+EGFR  History of prostate cancer -     PSA  Generalized anxiety disorder -     Will continue Effexor -     venlafaxine (EFFEXOR) 75 MG tablet; Take 1 tablet (75 mg total) by mouth 2 (two) times daily with a meal.  Episodic cluster headache, not intractable-   Is on  prednisone for cluster headaches       No follow-ups on file.  No future appointments.  Patient Instructions       If you have lab work done today you will be contacted with your lab results within the next 2 weeks.  If you have not heard from [place] then please contact us. The fastest way to get your results is to register for My Chart.   IF you received an x-ray today, you will receive an invoice  from [facility]. Please contact [facility] at [phone] with questions or concerns regarding your invoice.   IF you received labwork today, you will receive an invoice from [facility]. Please contact [facility] at [phone] with questions or concerns regarding your invoice.   Our billing staff will not be able to assist you with questions regarding bills from these companies.  You will be contacted with the lab results as soon as they are available. The fastest way to get your results is to activate your My Chart account. Instructions are located on the last page of this paperwork. If you have not heard from [place] regarding the results in 2 weeks, please contact this office.       An after visit summary with all of these plans was given to the patient.

## 2018-04-09 NOTE — Patient Instructions (Addendum)
If you have lab work done today you will be contacted with your lab results within the next 2 weeks.  If you have not heard from [place] then please contact us. The fastest way to get your results is to register for My Chart.   IF you received an x-ray today, you will receive an invoice from [facility]. Please contact [facility] at [phone] with questions or concerns regarding your invoice.   IF you received labwork today, you will receive an invoice from [place]. Please contact [facility] at [phone] with questions or concerns regarding your invoice.   Our billing staff will not be able to assist you with questions regarding bills from these companies.  You will be contacted with the lab results as soon as they are available. The fastest way to get your results is to activate your My Chart account. Instructions are located on the last page of this paperwork. If you have not heard from [place] regarding the results in 2 weeks, please contact this office.     Cancer Screening for Men A cancer screening is a test or exam that checks for cancer. Your health care provider will recommend specific cancer screenings based on your age, personal history, and family history of cancer. Work with your health care provider to create a cancer screening schedule that protects your health. Why is cancer screening done? Cancer screening is done to look for cancer in the very early stages, before it spreads and becomes harder to treat and before you would start to notice symptoms. Finding cancer early improves the chances of successful treatment. It may save your life. Who should be screened for cancer? All men should be screened for colorectal cancer and skin cancer. Your health care provider may recommend screenings for other types of cancer if:  You had cancer before.  You have a family member with cancer.  You have abnormal genes that could increase the risk of cancer.  You have  risk factors for certain cancers, such as smoking. When you should be screened for cancer depends on:  Your age.  Your medical history and your family's medical history.  Certain lifestyle factors, such as smoking.  Environmental exposure, such as to asbestos. What are some common cancer screenings? Lung cancer Lung cancer screening is done with a CT scan that looks for abnormal cells in the lungs. Discuss lung cancer screening with your health care provider if you are 76-62 years old and if any of the following apply to you:  You currently smoke.  You used to smoke heavily.  You have a smoking history of 1 pack a day for 30 years or 2 packs a day for 15 years.  You have quit smoking within the past 15 years. If you smoke heavily or if you used to smoke, you may need to be screened every year. Prostate cancer Prostate cancer screening is done with blood tests and an exam in which a health care provider uses a gloved finger to check prostate size (digital rectal exam). You may need to be screened for prostate cancer if:  You have risk factors of prostate cancer, such as being African American or having a close family member with prostate cancer.  You have inherited gene changes or a genetic condition, including BRCA1 or BRCA2 gene mutations or Lynch syndrome.  You have symptoms of prostate cancer, such as problems urinating or erectile dysfunction. Prostate cancer screening for men with average risk may start at age  12. Men with risk factors may need to be screened earlier at age 91-45. Once you have been screened for prostate cancer, future screening may be recommended based on the results of your blood tests. Colorectal cancer  All adults should have screening for colorectal cancer starting at age 63 and continuing until age 17. Your health care provider may recommend screening at age 8. You will have tests every 1-10 years, depending on your results and the type of screening  test. If you have a family history of colon or rectal cancer or other risk factors, you may need to start having screenings earlier. Talk with your health care provider about which screening test is right for you and how often you should be screened. Colorectal cancer screening looks for cancer or for growths called polyps that often form before cancer starts. Tests to look for cancer or polyps include:  Colonoscopy or flexible sigmoidoscopy. For these procedures, a flexible tube with a small camera is inserted into the rectum.  CT colonography. This test uses X-rays and a contrast dye to check the colon for polyps. If a polyp is found, you may need to have a colonoscopy so the polyp can be located and removed. Tests to look for cancer in the stool (feces) include:  Guaiac-based fecal occult blood test (FOBT). This test detects blood in stool. It can be done at home with a kit.  Fecal immunochemical test (FIT). This test detects blood in stool. For this test, you will need to collect stool samples at home.  Stool DNA test. This test looks for blood in stool and any changes in DNA that can lead to colon cancer. For this test, you will need to collect a stool sample at home and send it to a lab.  Skin cancer Skin cancer screening is done by checking the skin for unusual moles or spots and any changes in existing moles. Your health care provider should check your skin for signs of skin cancer at every physical exam. You should check your skin every month and tell your health care provider right away if anything looks unusual. Men with a higher-than-normal risk for skin cancer may want to see a skin specialist (dermatologist) for an annual body check. Where to find more information  [place]: SkinPromotion.no  Centers for Disease Control and Prevention: http://knight-sullivan.biz/  American Cancer Society:  https://www.cancer.org/latest-news/4-cancer-screening-tests-for-men.html Contact a health care provider if:  You have concerns about any signs or symptoms of cancer, such as: ? Moles that have an unusual shape or color. ? Changes in existing moles. ? A sore on your skin that does not heal. ? Blood in your urine or stool. ? Fatigue that does not go away. ? Frequent pain or cramping in your abdomen. ? Coughing or trouble breathing that does not go away. ? Coughing up blood. ? Losing weight without trying. ? Changes in urination habits. ? Painful urination or ejaculation. Summary  Be aware of and watch for signs and symptoms of cancer, especially symptoms of lung cancer, prostate cancer, colorectal cancer, and skin cancer.  Early detection of cancer with cancer screening may save your life.  Talk with your health care provider about your specific cancer risks.  Work together with your health care provider to create a cancer screening plan that is right for you. This information is not intended to replace advice given to you by your health care provider. Make sure you discuss any questions you have with your health care provider.  Document Released: 10/21/2015 Document Revised: 10/12/2017 Document Reviewed: 10/21/2015 Elsevier Interactive Patient Education  [employer].

## 2018-04-10 LAB — CMP14+EGFR
A/G RATIO: 2.1 (ref 1.2–2.2)
ALT: 25 IU/L (ref 0–44)
AST: 19 IU/L (ref 0–40)
Albumin: 4.5 g/dL (ref 3.8–4.8)
Alkaline Phosphatase: 55 IU/L (ref 39–117)
BILIRUBIN TOTAL: 0.5 mg/dL (ref 0.0–1.2)
BUN/Creatinine Ratio: 26 — ABNORMAL HIGH (ref 10–24)
BUN: 22 mg/dL (ref 8–27)
CHLORIDE: 105 mmol/L (ref 96–106)
CO2: 18 mmol/L — ABNORMAL LOW (ref 20–29)
Calcium: 9.3 mg/dL (ref 8.6–10.2)
Creatinine, Ser: 0.85 mg/dL (ref 0.76–1.27)
GFR calc non Af Amer: 90 mL/min/{1.73_m2} (ref >59)
GFR, EST AFRICAN AMERICAN: 104 mL/min/{1.73_m2} (ref >59)
GLOBULIN, TOTAL: 2.1 g/dL (ref 1.5–4.5)
Glucose: 88 mg/dL (ref 65–99)
POTASSIUM: 4.2 mmol/L (ref 3.5–5.2)
SODIUM: 139 mmol/L (ref 134–144)
TOTAL PROTEIN: 6.6 g/dL (ref 6.0–8.5)

## 2018-04-10 LAB — LIPID PANEL
CHOL/HDL RATIO: 2.7 ratio (ref 0.0–5.0)
Cholesterol, Total: 224 mg/dL — ABNORMAL HIGH (ref 100–199)
HDL: 84 mg/dL (ref >39)
LDL Calculated: 123 mg/dL — ABNORMAL HIGH (ref 0–99)
Triglycerides: 84 mg/dL (ref 0–149)
VLDL CHOLESTEROL CAL: 17 mg/dL (ref 5–40)

## 2018-04-10 LAB — TSH: TSH: 1.89 u[IU]/mL (ref 0.450–4.500)

## 2018-04-10 LAB — PSA: Prostate Specific Ag, Serum: <0.1 ng/mL (ref 0.0–4.0)

## 2018-04-12 ENCOUNTER — Encounter: Payer: Self-pay | Admitting: Radiology

## 2018-04-12 ENCOUNTER — Encounter: Payer: Self-pay | Admitting: Family Medicine

## 2018-04-19 ENCOUNTER — Other Ambulatory Visit: Payer: Self-pay | Admitting: Family Medicine

## 2018-04-19 DIAGNOSIS — F411 Generalized anxiety disorder: Secondary | ICD-10-CM

## 2018-04-19 NOTE — Telephone Encounter (Signed)
Pt switching pharmacy Requested Prescriptions  Pending Prescriptions Disp Refills  . venlafaxine (EFFEXOR) 75 MG tablet [Pharmacy Med Name: VENLAFAXINE 75MG  TABLETS] 126 tablet 3    Sig: TAKE 1 TABLET(75 MG) BY MOUTH TWICE DAILY WITH A MEAL KEEP APPT FOR FURTHER REFILLS ON 04/09/18     Psychiatry: Antidepressants - SNRI - desvenlafaxine & venlafaxine Failed - 04/19/2018  8:50 AM      Failed - LDL in normal range and within 360 days    LDL Calculated  Date Value Ref Range Status  04/09/2018 123 (H) 0 - 99 mg/dL Final         Failed - Total Cholesterol in normal range and within 360 days    Cholesterol, Total  Date Value Ref Range Status  04/09/2018 224 (H) 100 - 199 mg/dL Final         Passed - Triglycerides in normal range and within 360 days    Triglycerides  Date Value Ref Range Status  04/09/2018 84 0 - 149 mg/dL Final         Passed - Last BP in normal range    BP Readings from Last 1 Encounters:  04/09/18 138/82         Passed - Valid encounter within last 6 months    Recent Outpatient Visits          1 week ago Encounter for [employer] annual wellness exam   Primary Care at [facility], Arlie Solomons, MD   1 year ago Encounter for Medicare annual wellness exam   Primary Care at [facility], Arlie Solomons, MD   1 year ago Muscle spasm of back   Primary Care at [facility], Arlie Solomons, MD   2 years ago Generalized anxiety disorder   Primary Care at [facility], [place], MD   2 years ago [facility] to [facility] preventive visit   Primary Care at [facility], Arlie Solomons, MD

## 2018-07-04 DIAGNOSIS — Z85828 Personal history of other malignant neoplasm of skin: Secondary | ICD-10-CM | POA: Diagnosis not present

## 2018-07-04 DIAGNOSIS — L570 Actinic keratosis: Secondary | ICD-10-CM | POA: Diagnosis not present

## 2018-07-04 DIAGNOSIS — D229 Melanocytic nevi, unspecified: Secondary | ICD-10-CM | POA: Diagnosis not present

## 2018-09-16 DIAGNOSIS — Z0100 Encounter for examination of eyes and vision without abnormal findings: Secondary | ICD-10-CM | POA: Diagnosis not present

## 2018-10-10 DIAGNOSIS — R69 Illness, unspecified: Secondary | ICD-10-CM | POA: Diagnosis not present

## 2018-10-20 DIAGNOSIS — R69 Illness, unspecified: Secondary | ICD-10-CM | POA: Diagnosis not present

## 2018-11-11 DIAGNOSIS — R69 Illness, unspecified: Secondary | ICD-10-CM | POA: Diagnosis not present

## 2019-01-13 ENCOUNTER — Other Ambulatory Visit: Payer: Self-pay | Admitting: Family Medicine

## 2019-01-13 DIAGNOSIS — F411 Generalized anxiety disorder: Secondary | ICD-10-CM

## 2019-01-13 NOTE — Telephone Encounter (Signed)
Requested medication (s) are due for refill today: yes  Requested medication (s) are on the active medication list: yes  Last refill:  10/17/2018  Future visit scheduled: no  Notes to clinic:  Overdue for office visit    Requested Prescriptions  Pending Prescriptions Disp Refills   venlafaxine (EFFEXOR) 75 MG tablet [Pharmacy Med Name: VENLAFAXINE HCL 75 MG TABLET] 180 tablet 2    Sig: TAKE ONE TABLET BY MOUTH TWICE A DAY WITH A MEAL     Psychiatry: Antidepressants - SNRI - desvenlafaxine & venlafaxine Failed - 01/13/2019  9:10 AM      Failed - LDL in normal range and within 360 days    LDL Calculated  Date Value Ref Range Status  04/09/2018 123 (H) 0 - 99 mg/dL Final         Failed - Total Cholesterol in normal range and within 360 days    Cholesterol, Total  Date Value Ref Range Status  04/09/2018 224 (H) 100 - 199 mg/dL Final         Failed - Valid encounter within last 6 months    Recent Outpatient Visits          9 months ago Encounter for Medicare annual wellness exam   Primary Care at [facility], Arlie Solomons, MD   1 year ago Encounter for Medicare annual wellness exam   Primary Care at [facility] [place] A, MD   2 years ago Muscle spasm of back   Primary Care at [facility], Arlie Solomons, MD   2 years ago Generalized anxiety disorder   [facility] at [facility], [place], MD   2 years ago [facility] to [facility] preventive visit   Primary Care at [place], MD             Failed - Completed PHQ-2 or PHQ-9 in the last 360 days.      Passed - Triglycerides in normal range and within 360 days    Triglycerides  Date Value Ref Range Status  04/09/2018 84 0 - 149 mg/dL Final         Passed - Last BP in normal range    BP Readings from Last 1 Encounters:  04/09/18 138/82

## 2019-01-17 ENCOUNTER — Other Ambulatory Visit: Payer: Self-pay | Admitting: Family Medicine

## 2019-01-17 DIAGNOSIS — F411 Generalized anxiety disorder: Secondary | ICD-10-CM

## 2019-01-29 DIAGNOSIS — Z85828 Personal history of other malignant neoplasm of skin: Secondary | ICD-10-CM | POA: Diagnosis not present

## 2019-01-29 DIAGNOSIS — Z823 Family history of stroke: Secondary | ICD-10-CM | POA: Diagnosis not present

## 2019-01-29 DIAGNOSIS — Z8249 Family history of ischemic heart disease and other diseases of the circulatory system: Secondary | ICD-10-CM | POA: Diagnosis not present

## 2019-01-29 DIAGNOSIS — Z8546 Personal history of malignant neoplasm of prostate: Secondary | ICD-10-CM | POA: Diagnosis not present

## 2019-01-29 DIAGNOSIS — Z87891 Personal history of nicotine dependence: Secondary | ICD-10-CM | POA: Diagnosis not present

## 2019-01-29 DIAGNOSIS — Z809 Family history of malignant neoplasm, unspecified: Secondary | ICD-10-CM | POA: Diagnosis not present

## 2019-01-29 DIAGNOSIS — R69 Illness, unspecified: Secondary | ICD-10-CM | POA: Diagnosis not present

## 2019-02-02 DIAGNOSIS — Z20828 Contact with and (suspected) exposure to other viral communicable diseases: Secondary | ICD-10-CM | POA: Diagnosis not present

## 2019-04-11 ENCOUNTER — Other Ambulatory Visit: Payer: Self-pay

## 2019-04-11 ENCOUNTER — Encounter: Payer: Self-pay | Admitting: Family Medicine

## 2019-04-11 ENCOUNTER — Ambulatory Visit (INDEPENDENT_AMBULATORY_CARE_PROVIDER_SITE_OTHER): Payer: Medicare HMO | Admitting: Family Medicine

## 2019-04-11 VITALS — BP 122/75 | HR 53 | Temp 97.7°F | Resp 15 | Ht 71.0 in | Wt 207.6 lb

## 2019-04-11 DIAGNOSIS — Z8546 Personal history of malignant neoplasm of prostate: Secondary | ICD-10-CM | POA: Diagnosis not present

## 2019-04-11 DIAGNOSIS — Z0000 Encounter for general adult medical examination without abnormal findings: Secondary | ICD-10-CM

## 2019-04-11 DIAGNOSIS — Z13228 Encounter for screening for other metabolic disorders: Secondary | ICD-10-CM

## 2019-04-11 DIAGNOSIS — R202 Paresthesia of skin: Secondary | ICD-10-CM

## 2019-04-11 DIAGNOSIS — Z760 Encounter for issue of repeat prescription: Secondary | ICD-10-CM

## 2019-04-11 DIAGNOSIS — E7800 Pure hypercholesterolemia, unspecified: Secondary | ICD-10-CM

## 2019-04-11 MED ORDER — VENLAFAXINE HCL 75 MG PO TABS: ORAL_TABLET | ORAL | 3 refills | Status: DC

## 2019-04-11 NOTE — Patient Instructions (Addendum)
If you have lab work done today you will be contacted with your lab results within the next 2 weeks.  If you have not heard from [place] then please contact us. The fastest way to get your results is to register for My Chart.   IF you received an x-ray today, you will receive an invoice from [facility]. Please contact [facility] at [phone] with questions or concerns regarding your invoice.   IF you received labwork today, you will receive an invoice from [place]. Please contact [facility] at [phone] with questions or concerns regarding your invoice.   Our billing staff will not be able to assist you with questions regarding bills from these companies.  You will be contacted with the lab results as soon as they are available. The fastest way to get your results is to activate your My Chart account. Instructions are located on the last page of this paperwork. If you have not heard from [place] regarding the results in 2 weeks, please contact this office.     Paresthesia Paresthesia is an abnormal burning or prickling sensation. It is usually felt in the hands, arms, legs, or feet. However, it may occur in any part of the body. Usually, paresthesia is not painful. It may feel like:  Tingling or numbness.  Buzzing.  Itching. Paresthesia may occur without any clear cause, or it may be caused by:  Breathing too quickly (hyperventilation).  Pressure on a nerve.  An underlying medical condition.  Side effects of a medication.  Nutritional deficiencies.  Exposure to toxic chemicals. Most people experience temporary (transient) paresthesia at some time in their lives. For some people, it may be long-lasting (chronic) because of an underlying medical condition. If you have paresthesia that lasts a long time, you may need to be evaluated by your health care provider. Follow these instructions at home: Alcohol use   Do not drink alcohol if: ? Your health care  provider tells you not to drink. ? You are pregnant, may be pregnant, or are planning to become pregnant.  If you drink alcohol: ? Limit how much you use to:  0-1 drink a day for women.  0-2 drinks a day for men. ? Be aware of how much alcohol is in your drink. In the [place] one drink equals one 12 oz bottle of beer (355 mL), one 5 oz glass of wine (148 mL), or one 1 oz glass of hard liquor (44 mL). Nutrition   Eat a healthy diet. This includes: ? Eating foods that are high in fiber, such as fresh fruits and vegetables, whole grains, and beans. ? Limiting foods that are high in fat and processed sugars, such as fried or sweet foods. General instructions  Take over-the-counter and prescription medicines only as told by your health care provider.  Do not use any products that contain nicotine or tobacco, such as cigarettes and e-cigarettes. These can keep blood from reaching damaged nerves. If you need help quitting, ask your health care provider.  If you have diabetes, work closely with your health care provider to keep your blood sugar under control.  If you have numbness in your feet: ? Check every day for signs of injury or infection. Watch for redness, warmth, and swelling. ? Wear padded socks and comfortable shoes. These help protect your feet.  Keep all follow-up visits as told by your health care provider. This is important. Contact a health care provider if you:  Have paresthesia that gets  worse or does not go away.  Have a burning or prickling feeling that gets worse when you walk.  Have pain, cramps, or dizziness.  Develop a rash. Get help right away if you:  Feel weak.  Have trouble walking or moving.  Have problems with speech, understanding, or vision.  Feel confused.  Cannot control your bladder or bowel movements.  Have numbness after an injury.  Develop new weakness in an arm or leg.  Faint. Summary  Paresthesia is an abnormal burning or  prickling sensation that is usually felt in the hands, arms, legs, or feet. It may also occur in other parts of the body.  Paresthesia may occur without any clear cause, or it may be caused by breathing too quickly (hyperventilation), pressure on a nerve, an underlying medical condition, side effects of a medication, nutritional deficiencies, or exposure to toxic chemicals.  If you have paresthesia that lasts a long time, you may need to be evaluated by your health care provider. This information is not intended to replace advice given to you by your health care provider. Make sure you discuss any questions you have with your health care provider. Document Revised: 02/18/2018 Document Reviewed: 02/01/2017 Elsevier Patient Education  2020 [employer].

## 2019-04-11 NOTE — Progress Notes (Signed)
QUICK REFERENCE INFORMATION: The ABCs of Providing the Annual Wellness Visit  CMS.gov Medicare Learning Network  [employer] Annual Wellness Visit  Subjective:   Allen Burnett is a 69 y.o. Male who presents for an Annual Wellness Visit.   Patient Active Problem List   Diagnosis Date Noted  . Right leg pain 10/07/2015  . Insomnia 10/29/2013  . Melanoma of skin, site unspecified 10/29/2013  . PVC (premature ventricular contraction) 08/06/2013  . Cluster headache 10/04/2012  . Hyperlipidemia 10/02/2012  . Prostate cancer ([place]) 11/08/2011    Past Medical History:  Diagnosis Date  . Acid reflux OCCASIONALLY--  TAKE PREVACID  . Atypical nevus 02/20/2006   slight to moderate - right paraspinal, lower back  . Atypical nevus 10/01/2013   moderate to severe - upper paraspinal (WS)  . Atypical nevus 10/01/2013   moderate - right upper paraspinal (WS)  . Atypical nevus 04/27/2016   moderate - right upper back  . Basal cell carcinoma 02/20/2006   left medial chest  . Basal cell carcinoma 11/05/2013   right upper paraspinal (recheck in 3 months)  . Chronic cluster headache FOLLOWED AT [place]   PRN OXYGEN VIA [place] WHEN FEELS HEADACHE STARTING/  ON PREVENTION MEDS  . Halo nevus 03/30/2016   moderate - right upper back (WS)  . Hx of radiation therapy 12/28/11   prostate seed implant/75 seeds/,Dr.Dahlstedt  . Hyperlipidemia   . 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 Melanoma ([place]) 02/20/2006   left lateral chest - excision  . Melanoma in situ ([place]) 07/27/2009   right lobe of ear  . Melanoma in situ ([place]) 10/01/2013   left jawline [facility])  . Melanoma in situ ([place]) 10/01/2013   left cheek [facility])  . Prostate cancer ([place]) DX  10/11/11   Adenocarcinoma,gleason 3+3=6,PSA=5.39   volume=30cc  . PSA elevation    2009=3.4,2010=3.46,2011=3.16,2012=3.76,07/2011=4.85  . 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 Squamous  cell carcinoma of skin 10/26/2015   Right thigh, proximal  . Squamous cell carcinoma, face 10/01/2013   Right forehead - CX3 + 5FU  . Squamous cell carcinoma, face 11/15/2017   Right bulb nose - tx p bx     Past Surgical History:  Procedure Laterality Date  . APPENDECTOMY  AGE 77  . PROSTATE BIOPSY  10/11/11   gleason 3+3=6,PSA=5.39,Adenocarcinoma  . PROSTATE SURGERY    . RADIOACTIVE SEED IMPLANT  12/28/2011   Procedure: RADIOACTIVE SEED IMPLANT;  Surgeon: Franchot Gallo, MD;  Location: [facility];  Service: Urology;  Laterality: N/A;  75 seeds implanted  no seeds found in bladder     Outpatient Medications Prior to Visit  Medication Sig Dispense Refill  . clonazePAM (KLONOPIN) 1 MG tablet TAKE 1 TABLET BY MOUTH AT BEDTIME 30 tablet 0  . cyclobenzaprine (FLEXERIL) 5 MG tablet Take 1 tablet (5 mg total) by mouth 3 (three) times daily as needed for muscle spasms. 30 tablet 1  . predniSONE (DELTASONE) 10 MG tablet     . SUMAtriptan (IMITREX) 6 MG/0.5ML SOLN injection     . venlafaxine (EFFEXOR) 75 MG tablet TAKE ONE TABLET BY MOUTH TWICE A DAY WITH A MEAL 180 tablet 2  . verapamil (VERELAN PM) 120 MG 24 hr capsule Take 120 mg by mouth 2 (two) times daily.     No facility-administered medications prior to visit.    No Known Allergies   Family History  Problem Relation Age of Onset  . Cancer Brother 73       colon cancer now age 87  . Hyperlipidemia Mother   . Heart disease Maternal Grandfather   . Cancer Brother      Social History   Socioeconomic History  . Marital status: Married    Spouse name: Not on file  . Number of children: 2  . Years of education: Not on file  . Highest education level: Not on file  Occupational History    Comment: advertising sales/ retired  . Occupation: golf course and gym    Comment: Part time  Tobacco Use  . Smoking status: Former Smoker    Packs/day: 1.00    Years: 10.00    Pack years: 10.00    Types:  Cigarettes    Quit date: 07/24/1984    Years since quitting: 34.7  . Smokeless tobacco: Former Systems developer    Types: Chew  Substance and Sexual Activity  . Alcohol use: Yes    Alcohol/week: 6.0 - 8.0 standard drinks    Types: 6 - 8 Standard drinks or equivalent per week    Comment: beer on weekends  . Drug use: No  . Sexual activity: Yes    Partners: Female  Other Topics Concern  . Not on file  Social History Narrative  . Not on file   Social Determinants of Health   Financial Resource Strain:   . Difficulty of Paying Living Expenses: Not on file  Food Insecurity:   . Worried About Charity fundraiser in the Last Year: Not on file  . Ran Out of Food in the Last Year: Not on file  Transportation Needs:   . Lack of Transportation (Medical): Not on file  . Lack of Transportation (Non-Medical): Not on file  Physical Activity:   . Days of Exercise per Week: Not on file  . Minutes of Exercise per Session: Not on file  Stress:   . Feeling of Stress : Not on file  Social Connections:   . Frequency of Communication with Friends and Family: Not on file  . Frequency of Social Gatherings with Friends and Family: Not on file  . Attends Religious Services: Not on file  . Active Member of Clubs or Organizations: Not on file  . Attends Archivist Meetings: Not on file  . Marital Status: Not on file      Recent Hospitalizations? No  Health Habits: Current exercise activities include: cardiovascular workout on exercise equipment, running/ jogging and walking Exercise: 5 times/week. Diet: in general, a "healthy" diet    Alcohol intake: socially  Health Risk Assessment: The patient has completed a Health Risk Assessment. This has been reveiwed with them and has been scanned into the [place] system as an attached document.  Current Medical Providers and Suppliers: [facility] Patient Care Team: Forrest Moron, MD as PCP - General (Internal Medicine) No future  appointments.   Age-appropriate Screening Schedule: The list below includes current immunization status and future screening recommendations based on patient's age. Orders for these recommended tests are listed in the plan section. The patient has been provided with a written plan. Immunization History  Administered Date(s) Administered  . Influenza, High Dose Seasonal PF 10/20/2018  . Influenza-Unspecified 12/06/2015, 12/13/2016  . Pneumococcal Conjugate-13 10/29/2013  . Pneumococcal Polysaccharide-23 03/15/2016    Health Maintenance reviewed -  Up to date   Depression Screen-PHQ2/9 completed today  Depression screen [facility] 2/9 04/11/2019 04/09/2018 02/19/2017 04/24/2016 04/24/2016  Decreased Interest 0 0 0 0 0  Down, Depressed, Hopeless 1 0 0 0 0  PHQ - 2 Score 1 0 0 0 0       Depression Severity and Treatment Recommendations:  0-4= None  5-9= Mild / Treatment: Support, educate to call if worse; return in one month  10-14= Moderate / Treatment: Support, watchful waiting; Antidepressant or Psycotherapy  15-19= Moderately severe / Treatment: Antidepressant OR Psychotherapy  >= 20 = Major depression, severe / Antidepressant AND Psychotherapy  Functional Status Survey:   Is the patient deaf or have difficulty hearing?: No Does the patient have difficulty seeing, even when wearing glasses/contacts?: No Does the patient have difficulty concentrating, remembering, or making decisions?: No Does the patient have difficulty walking or climbing stairs?: No Does the patient have difficulty dressing or bathing?: No Does the patient have difficulty doing errands alone such as visiting a doctor's office or shopping?: No   Advanced Care Planning: 1. Patient has executed an Advance Directive: Yes 2. If no, patient was given the opportunity to execute an Advance Directive today? n/a 3. Are the patient's advanced directives in [place]? Yes 4. This patient has the ability to prepare an Advance  Directive: n/a 5. Provider is willing to follow the patient's wishes: Yes  Cognitive Assessment: Does the patient have evidence of cognitive impairment? No The patient does not have any evidence of any cognitive problems and denies any  change in mood/affect, appearance, speech, memory or motor skills.  Identification of Risk Factors: Risk factors include: hyperlipidemia  ROS  Review of Systems  Constitutional: Negative for activity change, appetite change, chills and fever.  HENT: Negative for congestion, nosebleeds, trouble swallowing and voice change.   Respiratory: Negative for cough, shortness of breath and wheezing.   Gastrointestinal: Negative for diarrhea, nausea and vomiting.  Genitourinary: Negative for difficulty urinating, dysuria, flank pain and hematuria.  Musculoskeletal: Negative for back pain, joint swelling and neck pain.  Neurological: Negative for dizziness, speech difficulty, light-headedness and numbness. +tingling in the shoulder and upper arm worse on right than left.  See HPI. All other review of systems negative.    Objective:   Vitals:   04/11/19 0807  BP: 122/75  Pulse: (!) 53  Resp: 15  Temp: 97.7 F (36.5 C)  TempSrc: Temporal  SpO2: 97%  Weight: 207 lb 9.6 oz (94.2 kg)  Height: '5\' 11"'$  (1.803 m)    Body mass index is 28.95 kg/m.  BP 122/75   Pulse (!) 53   Temp 97.7 F (36.5 C) (Temporal)   Resp 15   Ht '5\' 11"'$  (1.803 m)   Wt 207 lb 9.6 oz (94.2 kg)   SpO2 97%   BMI 28.95 kg/m   Physical Exam  Constitutional: Oriented to person, place, and time. Appears well-developed and well-nourished.  HENT:  Head: Normocephalic and atraumatic.  Eyes: Conjunctivae and EOM are normal.  Neck: supple, no thyromegaly Cardiovascular: Normal rate, regular rhythm, normal heart sounds and intact distal pulses.  No murmur heard. Pulmonary/Chest: Effort normal and breath sounds normal. No stridor. No respiratory distress. Has no wheezes.  Abdomen:  nondistended, normoactive bs, soft, nontender Neurological: Is alert and oriented to person, place, and time.  Skin: Skin is warm. Capillary refill takes less than 2 seconds.  Psychiatric: Has a normal mood and affect. Behavior is normal. Judgment and thought content normal.   Shoulder exam: AC joint nontender on the right, reduced range of motion on the left, nontender at the [facility] joint  on the left, no crepitus.   Assessment/Plan:   Patient Self-Management and Personalized Health Advice The patient has been provided with information about:  reduce exposure to stress  During the course of the visit the patient was educated and counseled about appropriate screening and preventive services including:   return annually or prn     Body mass index is 28.95 kg/m. Discussed the patient's BMI with him. The BMI BMI is in the acceptable range  Allen Burnett was seen today for annual exam.  Diagnoses and all orders for this visit:  Medicare annual wellness visit, subsequent - discussed men's health   Elevated LDL cholesterol level -     Lipid panel  Encounter for screening for other metabolic disorders -     [id number]  History of prostate cancer -     PSA  Encounter for medication refill -     venlafaxine (EFFEXOR) 75 MG tablet; TAKE ONE TABLET BY MOUTH TWICE A DAY WITH A MEAL  Paresthesia- will check vitamin D -     Vitamin B12      Return in about 1 year (around 04/10/2020).  No future appointments.  Patient Instructions       If you have lab work done today you will be contacted with your lab results within the next 2 weeks.  If you have not heard from [place] then please contact us. The fastest way to get your results is to register for My Chart.   IF you received an x-ray today, you will receive an invoice from [facility]. Please contact [facility] at [phone] with questions or concerns regarding your invoice.   IF you received labwork today, you will  receive an invoice from [place]. Please contact [facility] at [phone] with questions or concerns regarding your invoice.   Our billing staff will not be able to assist you with questions regarding bills from these companies.  You will be contacted with the lab results as soon as they are available. The fastest way to get your results is to activate your My Chart account. Instructions are located on the last page of this paperwork. If you have not heard from [place] regarding the results in 2 weeks, please contact this office.     Paresthesia Paresthesia is an abnormal burning or prickling sensation. It is usually felt in the hands, arms, legs, or feet. However, it may occur in any part of the body. Usually, paresthesia is not painful. It may feel like:  Tingling or numbness.  Buzzing.  Itching. Paresthesia may occur without any clear cause, or it may be caused by:  Breathing too quickly (hyperventilation).  Pressure on a nerve.  An underlying medical condition.  Side effects of a medication.  Nutritional deficiencies.  Exposure to toxic chemicals. Most people experience temporary (transient) paresthesia at some time in their lives. For some people, it may be long-lasting (chronic) because of an underlying medical condition. If you have paresthesia that lasts a long time, you may need to be evaluated by your health care provider. Follow these instructions at home: Alcohol use   Do not drink alcohol if: ? Your health care provider tells you not to drink. ? You are pregnant, may be pregnant, or are planning to become pregnant.  If you drink alcohol: ? Limit how much you use to:  0-1 drink a day for women.  0-2 drinks a day for men. ? Be aware of how much alcohol is in your drink. In the [place] one  drink equals one 12 oz bottle of beer (355 mL), one 5 oz glass of wine (148 mL), or one 1 oz glass of hard liquor (44 mL). Nutrition   Eat a healthy diet. This  includes: ? Eating foods that are high in fiber, such as fresh fruits and vegetables, whole grains, and beans. ? Limiting foods that are high in fat and processed sugars, such as fried or sweet foods. General instructions  Take over-the-counter and prescription medicines only as told by your health care provider.  Do not use any products that contain nicotine or tobacco, such as cigarettes and e-cigarettes. These can keep blood from reaching damaged nerves. If you need help quitting, ask your health care provider.  If you have diabetes, work closely with your health care provider to keep your blood sugar under control.  If you have numbness in your feet: ? Check every day for signs of injury or infection. Watch for redness, warmth, and swelling. ? Wear padded socks and comfortable shoes. These help protect your feet.  Keep all follow-up visits as told by your health care provider. This is important. Contact a health care provider if you:  Have paresthesia that gets worse or does not go away.  Have a burning or prickling feeling that gets worse when you walk.  Have pain, cramps, or dizziness.  Develop a rash. Get help right away if you:  Feel weak.  Have trouble walking or moving.  Have problems with speech, understanding, or vision.  Feel confused.  Cannot control your bladder or bowel movements.  Have numbness after an injury.  Develop new weakness in an arm or leg.  Faint. Summary  Paresthesia is an abnormal burning or prickling sensation that is usually felt in the hands, arms, legs, or feet. It may also occur in other parts of the body.  Paresthesia may occur without any clear cause, or it may be caused by breathing too quickly (hyperventilation), pressure on a nerve, an underlying medical condition, side effects of a medication, nutritional deficiencies, or exposure to toxic chemicals.  If you have paresthesia that lasts a long time, you may need to be evaluated  by your health care provider. This information is not intended to replace advice given to you by your health care provider. Make sure you discuss any questions you have with your health care provider. Document Revised: 02/18/2018 Document Reviewed: 02/01/2017 Elsevier Patient Education  [employer].    An after visit summary with all of these plans was given to the patient.

## 2019-04-12 LAB — LIPID PANEL
Chol/HDL Ratio: 3.4 ratio (ref 0.0–5.0)
Cholesterol, Total: 258 mg/dL — ABNORMAL HIGH (ref 100–199)
HDL: 76 mg/dL (ref >39)
LDL Chol Calc (NIH): 166 mg/dL — ABNORMAL HIGH (ref 0–99)
Triglycerides: 95 mg/dL (ref 0–149)
VLDL Cholesterol Cal: 16 mg/dL (ref 5–40)

## 2019-04-12 LAB — CMP14+EGFR
ALT: 22 IU/L (ref 0–44)
AST: 23 IU/L (ref 0–40)
Albumin/Globulin Ratio: 1.9 (ref 1.2–2.2)
Albumin: 4.4 g/dL (ref 3.8–4.8)
Alkaline Phosphatase: 62 IU/L (ref 39–117)
BUN/Creatinine Ratio: 20 (ref 10–24)
BUN: 19 mg/dL (ref 8–27)
Bilirubin Total: 0.5 mg/dL (ref 0.0–1.2)
CO2: 21 mmol/L (ref 20–29)
Calcium: 9.1 mg/dL (ref 8.6–10.2)
Chloride: 105 mmol/L (ref 96–106)
Creatinine, Ser: 0.93 mg/dL (ref 0.76–1.27)
GFR calc Af Amer: 97 mL/min/{1.73_m2} (ref >59)
GFR calc non Af Amer: 84 mL/min/{1.73_m2} (ref >59)
Globulin, Total: 2.3 g/dL (ref 1.5–4.5)
Glucose: 99 mg/dL (ref 65–99)
Potassium: 4.7 mmol/L (ref 3.5–5.2)
Sodium: 138 mmol/L (ref 134–144)
Total Protein: 6.7 g/dL (ref 6.0–8.5)

## 2019-04-12 LAB — VITAMIN B12: Vitamin B-12: 1105 pg/mL (ref 232–1245)

## 2019-04-12 LAB — PSA: Prostate Specific Ag, Serum: <0.1 ng/mL (ref 0.0–4.0)

## 2019-04-24 ENCOUNTER — Telehealth: Payer: Self-pay | Admitting: Family Medicine

## 2019-04-25 NOTE — Telephone Encounter (Signed)
Pt came in to get updated lab work results.

## 2019-07-08 DIAGNOSIS — G44009 Cluster headache syndrome, unspecified, not intractable: Secondary | ICD-10-CM | POA: Diagnosis not present

## 2019-10-02 DIAGNOSIS — U071 COVID-19: Secondary | ICD-10-CM | POA: Diagnosis not present

## 2019-10-28 DIAGNOSIS — H2513 Age-related nuclear cataract, bilateral: Secondary | ICD-10-CM | POA: Diagnosis not present

## 2019-10-28 DIAGNOSIS — Z0100 Encounter for examination of eyes and vision without abnormal findings: Secondary | ICD-10-CM | POA: Diagnosis not present

## 2019-11-07 ENCOUNTER — Other Ambulatory Visit: Payer: Self-pay

## 2019-11-07 ENCOUNTER — Telehealth (INDEPENDENT_AMBULATORY_CARE_PROVIDER_SITE_OTHER): Payer: Medicare HMO | Admitting: Family Medicine

## 2019-11-07 ENCOUNTER — Encounter: Payer: Self-pay | Admitting: Family Medicine

## 2019-11-07 VITALS — Ht 72.0 in | Wt 200.0 lb

## 2019-11-07 DIAGNOSIS — J209 Acute bronchitis, unspecified: Secondary | ICD-10-CM | POA: Diagnosis not present

## 2019-11-07 MED ORDER — BENZONATATE 100 MG PO CAPS: 100.0000 mg | ORAL_CAPSULE | Freq: Three times a day (TID) | ORAL | 0 refills | Status: DC | PRN

## 2019-11-07 MED ORDER — AMOXICILLIN 875 MG PO TABS: 875.0000 mg | ORAL_TABLET | Freq: Two times a day (BID) | ORAL | 0 refills | Status: DC

## 2019-11-07 NOTE — Patient Instructions (Addendum)
°  Drink plenty of fluids to stay well-hydrated  Continue your Robitussin-DM   Take benzonatate cough pills 200 mg every 6 or 8 hours as needed for cough  If getting worse over the next 2 to 3 days or not improving you can go ahead and get the antibiotic prescription filled and take amoxicillin 875 mg 1 twice daily  If getting worse you should discuss with [place] and consider whether you are a candidate to come in for recheck.   If you have lab work done today you will be contacted with your lab results within the next 2 weeks.  If you have not heard from [place] then please contact us. The fastest way to get your results is to register for My Chart.   IF you received an x-ray today, you will receive an invoice from [facility]. Please contact [facility] at [phone] with questions or concerns regarding your invoice.   IF you received labwork today, you will receive an invoice from [place]. Please contact [facility] at [phone] with questions or concerns regarding your invoice.   Our billing staff will not be able to assist you with questions regarding bills from these companies.  You will be contacted with the lab results as soon as they are available. The fastest way to get your results is to activate your My Chart account. Instructions are located on the last page of this paperwork. If you have not heard from [place] regarding the results in 2 weeks, please contact this office.

## 2019-11-07 NOTE — Progress Notes (Signed)
Patient ID: Allen Burnett, male    DOB: 1950/03/21  Age: 69 y.o. MRN: [id number]  Chief Complaint  Patient presents with  . Cough    Pt stated that he has been coughing since 11/03/2019 along with flem. Pt also had COVID at the end of Aug.     Subjective:   Patient is seen for a telemedicine visit.  He is in a secure place at home and properly identified himself.  He understands that he will be billed for services.  He had Covid in late August or early September, not very sick, gradually got completely cleared.  Was feeling great until Monday or Tuesday of this week when he developed a respiratory tract infection with a cough.  He has been coughing up a yellow phlegm.  He has not been febrile except for a 9 9 point something temperature the first day.  He got Covid tested and was negative.  He does not feel particularly ill.  He has been taking over-the-counter Robitussin-DM.  He does not get a lot of respiratory tract infections, in fact this is quite unusual for him.  Current allergies, medications, problem list, past/family and social histories reviewed.  Objective:  Ht 6' (1.829 m)   Wt 200 lb (90.7 kg)   BMI 27.12 kg/m   Unable to do an exam.  He is a little bit raspy in his voice on the phone.  Denies any wheezing. Assessment & Plan:   Assessment: 1. Acute bronchitis, unspecified organism       Plan: Treated with cough suppression.  If he is getting worse or not doing better in the next 2 or 3 days he can get the antibiotic prescription filled and take it.  He understands the instructions. No orders of the defined types were placed in this encounter.   Meds ordered this encounter  Medications  . benzonatate (TESSALON) 100 MG capsule    Sig: Take 1-2 capsules (100-200 mg total) by mouth 3 (three) times daily as needed for cough.    Dispense:  40 capsule    Refill:  0  . amoxicillin (AMOXIL) 875 MG tablet    Sig: Take 1 tablet (875 mg total) by mouth 2 (two) times daily.     Dispense:  14 tablet    Refill:  0    Patient will not fill rx unless getting worse         Patient Instructions    Drink plenty of fluids to stay well-hydrated  Continue your Robitussin-DM  Take benzonatate cough pills 200 mg every 6 or 8 hours as needed for cough  If getting worse over the next 2 to 3 days or not improving you can go ahead and get the antibiotic prescription filled and take amoxicillin 875 mg 1 twice daily  If getting worse you should discuss with [place] and consider whether you are a candidate to come in for recheck.   If you have lab work done today you will be contacted with your lab results within the next 2 weeks.  If you have not heard from [place] then please contact us. The fastest way to get your results is to register for My Chart.   IF you received an x-ray today, you will receive an invoice from [facility]. Please contact [facility] at [phone] with questions or concerns regarding your invoice.   IF you received labwork today, you will receive an invoice from [place]. Please contact [facility] at [phone] with questions or concerns regarding  your invoice.   Our billing staff will not be able to assist you with questions regarding bills from these companies.  You will be contacted with the lab results as soon as they are available. The fastest way to get your results is to activate your My Chart account. Instructions are located on the last page of this paperwork. If you have not heard from [place] regarding the results in 2 weeks, please contact this office.        No follow-ups on file.  15-minute 11/07/2019

## 2019-11-17 DIAGNOSIS — R69 Illness, unspecified: Secondary | ICD-10-CM | POA: Diagnosis not present

## 2020-02-09 ENCOUNTER — Encounter: Payer: Medicare HMO | Admitting: Emergency Medicine

## 2020-03-03 ENCOUNTER — Ambulatory Visit: Payer: Medicare HMO | Admitting: Physician Assistant

## 2020-03-03 ENCOUNTER — Other Ambulatory Visit: Payer: Self-pay

## 2020-03-03 ENCOUNTER — Encounter: Payer: Self-pay | Admitting: Physician Assistant

## 2020-03-03 DIAGNOSIS — D0461 Carcinoma in situ of skin of right upper limb, including shoulder: Secondary | ICD-10-CM

## 2020-03-03 DIAGNOSIS — Z85820 Personal history of malignant melanoma of skin: Secondary | ICD-10-CM

## 2020-03-03 DIAGNOSIS — C44722 Squamous cell carcinoma of skin of right lower limb, including hip: Secondary | ICD-10-CM

## 2020-03-03 DIAGNOSIS — Z85828 Personal history of other malignant neoplasm of skin: Secondary | ICD-10-CM | POA: Diagnosis not present

## 2020-03-03 DIAGNOSIS — D485 Neoplasm of uncertain behavior of skin: Secondary | ICD-10-CM

## 2020-03-03 DIAGNOSIS — Z86018 Personal history of other benign neoplasm: Secondary | ICD-10-CM

## 2020-03-03 DIAGNOSIS — L570 Actinic keratosis: Secondary | ICD-10-CM

## 2020-03-03 DIAGNOSIS — C4492 Squamous cell carcinoma of skin, unspecified: Secondary | ICD-10-CM

## 2020-03-03 DIAGNOSIS — Z1283 Encounter for screening for malignant neoplasm of skin: Secondary | ICD-10-CM

## 2020-03-03 DIAGNOSIS — Z87898 Personal history of other specified conditions: Secondary | ICD-10-CM

## 2020-03-03 HISTORY — DX: Squamous cell carcinoma of skin, unspecified: C44.92

## 2020-03-03 NOTE — Patient Instructions (Signed)

## 2020-03-04 ENCOUNTER — Ambulatory Visit: Payer: Medicare HMO | Admitting: Physician Assistant

## 2020-03-10 ENCOUNTER — Telehealth: Payer: Self-pay | Admitting: Physician Assistant

## 2020-03-10 ENCOUNTER — Encounter: Payer: Self-pay | Admitting: Physician Assistant

## 2020-03-10 NOTE — Telephone Encounter (Signed)
-----   Message from Warren Danes, [place] sent at 03/09/2020  4:59 PM EST ----- 30 min surgery

## 2020-03-10 NOTE — Telephone Encounter (Signed)
Phone call to patient with his pathology results.  Path to patient. 

## 2020-03-10 NOTE — Telephone Encounter (Signed)
Patient is calling for pathology resullts from last visit with [facility], PA-C.

## 2020-03-12 ENCOUNTER — Ambulatory Visit: Payer: Medicare HMO | Admitting: Family Medicine

## 2020-03-12 ENCOUNTER — Other Ambulatory Visit: Payer: Self-pay

## 2020-03-12 ENCOUNTER — Ambulatory Visit
Admission: RE | Admit: 2020-03-12 | Discharge: 2020-03-12 | Disposition: A | Discharge status: Home / Self Care | Payer: Medicare HMO | Source: Ambulatory Visit | Attending: Sports Medicine | Admitting: Sports Medicine

## 2020-03-12 VITALS — BP 136/88 | Ht 71.0 in | Wt 200.0 lb

## 2020-03-12 DIAGNOSIS — M542 Cervicalgia: Secondary | ICD-10-CM

## 2020-03-12 DIAGNOSIS — R222 Localized swelling, mass and lump, trunk: Secondary | ICD-10-CM | POA: Diagnosis not present

## 2020-03-12 DIAGNOSIS — M79644 Pain in right finger(s): Secondary | ICD-10-CM

## 2020-03-12 DIAGNOSIS — G8929 Other chronic pain: Secondary | ICD-10-CM | POA: Diagnosis not present

## 2020-03-12 DIAGNOSIS — M47812 Spondylosis without myelopathy or radiculopathy, cervical region: Secondary | ICD-10-CM | POA: Diagnosis not present

## 2020-03-12 DIAGNOSIS — M25511 Pain in right shoulder: Secondary | ICD-10-CM | POA: Diagnosis not present

## 2020-03-12 DIAGNOSIS — M19041 Primary osteoarthritis, right hand: Secondary | ICD-10-CM | POA: Diagnosis not present

## 2020-03-12 MED ORDER — CYCLOBENZAPRINE HCL 5 MG PO TABS: 5.0000 mg | ORAL_TABLET | Freq: Every evening | ORAL | 0 refills | Status: DC | PRN

## 2020-03-12 MED ORDER — MELOXICAM 15 MG PO TABS: 15.0000 mg | ORAL_TABLET | Freq: Every day | ORAL | 0 refills | Status: DC | PRN

## 2020-03-12 NOTE — Patient Instructions (Signed)
declined

## 2020-03-12 NOTE — Assessment & Plan Note (Signed)
I suspect his right thumb pain is most likely due to osteoarthritis given that it is swollen mildly tender and is worse with activity.  Obtain x-rays today to confirm but will go ahead and treat with meloxicam taken once daily with food for 2 weeks and then as needed.  He can also use Voltaren gel.  I hope this will give him some relief and I will call him as soon as x-rays result.  If for some reason x-rays are negative we could consider further work-up to rule out gout, pseudogout, or any autoimmune arthropathy.  Highly doubt any of these are taking place due to exam today which had no redness or extreme tenderness or warmth.  Also this is the only joint of the hand that seems to be affected and if it or RA would expect to have other joints affected not the thumb.

## 2020-03-12 NOTE — Assessment & Plan Note (Signed)
I think is right shoulder pain/neck pain could be due to nerve impingement coming from the cervical spine.  He has never had x-rays of his neck so we will proceed with that today.  Given his shoulder exam was completely benign but he did have a positive Spurling's test and a lot of pain with neck range of motion I think this pain is most likely coming from the C-spine.  Muscle relaxer as well as meloxicam that I gave for his thumb should also help relieve this.  Also recommended trying over-the-counter medications such as Voltaren gel, Salonpas, heating pads.  Also recommended formal PT as he has been doing some home exercises on his own and states that it does help although he does not do them consistently and it has not completely relieve his symptoms.  We will see him back in 4 weeks for follow-up.

## 2020-03-12 NOTE — Progress Notes (Addendum)
SUBJECTIVE:   CHIEF COMPLAINT / HPI:   Right thumb pain Mr. Allen Burnett is a very pleasant 70 year old male who presents today with a new problem of right thumb pain that started about 6 weeks ago.  He states that although he has injured this thumb in the past and sprained and strained it he has not done anything of note in the recent 6 weeks which she can think of that have caused his pain.  He has no previous history of gout and no trauma or inciting incident but has noticed that the right thumb hurts especially when moving the thumb and in certain positions.  He also has noticed that the thumb is swollen.  He does not recall it being exquisitely tender or red or felt hot.  Right-sided neck/shoulder pain He is an avid golfer and states that with certain activity he has pain in where he feels is his right trapezius muscle.  He denies any pain when he moves his shoulder in any direction and feels like he has good strength however certain movements especially when golfing tend to elicit his pain.  Rest tends to make it better and he has used topical medication with minimal and temporary relief.  He states the pain originates from the right side of his neck and radiates down through the shoulder more towards the back and stops at the shoulder.  He denies any numbness tingling or burning sensation which radiates down to his arm or to the hand.  He has not noticed any loss of strength or loss of sensation.  PERTINENT  PMH / PSH: History of cluster headaches, hyperlipidemia, history of prostate cancer, history of melanoma  OBJECTIVE:   BP 136/88   Ht 5\' 11"  (1.803 m)   Wt 200 lb (90.7 kg)   BMI 27.89 kg/m   No flowsheet data found.  Cervical spine:  - Inspection: no gross deformity or asymmetry, swelling or ecchymosis. No skin changes - Palpation: No TTP over the spinous processes, TTP at right paraspinal muscles of cervical spine - ROM: limited active ROM of the cervical spine in  extension and left rotation and side bending that illicits pain - Strength: 5/5 strength of right extremity in C5-T1 nerve root distributions b/l - Neuro: sensation intact in the C5-T1 nerve root distribution b/l  Shoulder, Right: No evidence of bony deformity, asymmetry, or muscle atrophy; No tenderness over long head of biceps (bicipital groove). No TTP at [facility] joint. Full active and passive range of motion (180 flex Allen Burnett /150Abd /90ER /70IR), Thumb to T12 without significant tenderness. Strength 5/5 throughout. No abnormal scapular function observed. Sensation intact. Peripheral pulses intact.  Special Tests:   -Spurling's test: Positive     - Crossarm test: NEG - Jobe test: NEG   - Hawkins: NEG   - Neer test: NEG   - Belly press test: NEG   - Drop arm test: NEG   - Painful Arc test: NEG  Hand, Right: Inspection yielded no erythema, warmth, or ecchymosis, slight bony deformity of right thumb at MCP joint, positive for mild swelling. ROM limited in abduction, flexion, and extension. 1st MCP is mildly TTP. Strength testing with resistance to thumb flexion, extension, and abduction is normal but painful on the right compared to the left. Gross sensation intact.   ASSESSMENT/PLAN:   Pain of right thumb I suspect his right thumb pain is most likely due to osteoarthritis given that it is swollen mildly tender and is worse with activity.  Obtain x-rays today to confirm but will go ahead and treat with meloxicam taken once daily with food for 2 weeks and then as needed.  He can also use Voltaren gel.  I hope this will give him some relief and I will call him as soon as x-rays result.  If for some reason x-rays are negative we could consider further work-up to rule out gout, pseudogout, or any autoimmune arthropathy.  Highly doubt any of these are taking place due to exam today which had no redness or extreme tenderness or warmth.  Also this is the only joint of the hand that seems to be  affected and if it or RA would expect to have other joints affected not the thumb.  Neck pain on right side I think is right shoulder pain/neck pain could be due to nerve impingement coming from the cervical spine.  He has never had x-rays of his neck so we will proceed with that today.  Given his shoulder exam was completely benign but he did have a positive Spurling's test and a lot of pain with neck range of motion I think this pain is most likely coming from the C-spine.  Muscle relaxer as well as meloxicam that I gave for his thumb should also help relieve this.  Also recommended trying over-the-counter medications such as Voltaren gel, Salonpas, heating pads.  Also recommended formal PT as he has been doing some home exercises on his own and states that it does help although he does not do them consistently and it has not completely relieve his symptoms.  We will see him back in 4 weeks for follow-up.     Nuala Alpha, DO PGY-4, Sports Medicine Fellow [place]  I was the preceptor for this visit and available for immediate consultation Shellia Cleverly, DO

## 2020-03-18 ENCOUNTER — Encounter: Payer: Self-pay | Admitting: Physician Assistant

## 2020-03-18 NOTE — Progress Notes (Signed)
Follow-Up Visit   Subjective  Allen Burnett is a 70 y.o. male who presents for the following: Annual Exam (No new concerns).   The following portions of the chart were reviewed this encounter and updated as appropriate:  Tobacco  Allergies  Meds  Problems  Med Hx  Surg Hx  Fam Hx      Objective  Well appearing patient in no apparent distress; mood and affect are within normal limits.  A full examination was performed including scalp, head, eyes, ears, nose, lips, neck, chest, axillae, abdomen, back, buttocks, bilateral upper extremities, bilateral lower extremities, hands, feet, fingers, toes, fingernails, and toenails. All findings within normal limits unless otherwise noted below.  Objective  Left Lateral Chest: Scar clear  Objective  Right Breast: Multiple white scar- clear  Objective  Neck - Anterior: Multiple white scar- clear  Objective  Neck - Anterior: Multiple white scar- clear  Objective  Head to Toe: Full body skin examination no signs of atypical moles, or melanoma.  Objective  Left Dorsal Hand, Left Forearm - Posterior, Left Superior Helix, Right Dorsal Hand, Right Inguinal Area, Right Superior Helix: Erythematous patches with gritty scale.  Objective  Right Lower Shin:  Crusty volcano growth with pink base       Right Dorsal Hand        Assessment & Plan  Personal history of malignant melanoma of skin Left Lateral Chest  Yearly skin check  History of atypical nevus Right Breast  Yearly skin check  History of squamous cell carcinoma of skin Neck - Anterior  Yearly skin check  History of basal cell cancer Neck - Anterior  Yearly skin check  Encounter for screening for malignant neoplasm of skin Head to Toe  Yearly skin check  AK (actinic keratosis) (6) Left Forearm - Posterior; Right Inguinal Area; Left Dorsal Hand; Right Dorsal Hand; Left Superior Helix; Right Superior Helix  Destruction of lesion - Left Dorsal  Hand, Left Forearm - Posterior, Left Superior Helix, Right Dorsal Hand, Right Inguinal Area, Right Superior Helix Complexity: simple   Destruction method: cryotherapy   Informed consent: discussed and consent obtained   Timeout:  patient name, date of birth, surgical site, and procedure verified Lesion destroyed using liquid nitrogen: Yes   Cryotherapy cycles:  1 Outcome: patient tolerated procedure well with no complications   Post-procedure details: wound care instructions given    SCC (squamous cell carcinoma), leg, right Right Lower Shin  Skin / nail biopsy Type of biopsy: tangential   Informed consent: discussed and consent obtained   Timeout: patient name, date of birth, surgical site, and procedure verified   Procedure prep:  Patient was prepped and draped in usual sterile fashion (Non sterile) Prep type:  Chlorhexidine Anesthesia: the lesion was anesthetized in a standard fashion   Anesthetic:  1% lidocaine w/ epinephrine 1-100,000 local infiltration Instrument used: flexible razor blade   Hemostasis achieved with: aluminum chloride   Outcome: patient tolerated procedure well   Post-procedure details: sterile dressing applied and wound care instructions given   Dressing type: pressure dressing and petrolatum    Specimen 1 - Surgical pathology Differential Diagnosis: R/O BCC vs SCC  Check Margins: No   Carcinoma in situ of skin of right upper extremity including shoulder Right Dorsal Hand  Skin / nail biopsy Type of biopsy: tangential   Informed consent: discussed and consent obtained   Timeout: patient name, date of birth, surgical site, and procedure verified   Procedure prep:  Patient was  prepped and draped in usual sterile fashion (Non sterile) Prep type:  Chlorhexidine Anesthesia: the lesion was anesthetized in a standard fashion   Anesthetic:  1% lidocaine w/ epinephrine 1-100,000 local infiltration Instrument used: flexible razor blade   Hemostasis achieved  with: aluminum chloride and electrodesiccation   Outcome: patient tolerated procedure well   Post-procedure details: sterile dressing applied and wound care instructions given   Dressing type: bandage and petrolatum    Specimen 2 - Surgical pathology Differential Diagnosis: R/O BCC vs SCC  Check Margins: No  Cautery after biopsy    I, Aneisa Karren, PA-C, have reviewed all documentation's for this visit.  The documentation on 03/18/20 for the exam, diagnosis, procedures and orders are all accurate and complete.

## 2020-04-06 ENCOUNTER — Other Ambulatory Visit: Payer: Self-pay

## 2020-04-06 ENCOUNTER — Ambulatory Visit (INDEPENDENT_AMBULATORY_CARE_PROVIDER_SITE_OTHER): Payer: Medicare HMO | Admitting: Emergency Medicine

## 2020-04-06 DIAGNOSIS — Z0000 Encounter for general adult medical examination without abnormal findings: Secondary | ICD-10-CM | POA: Diagnosis not present

## 2020-04-07 LAB — LIPID PANEL
Chol/HDL Ratio: 3.3 ratio (ref 0.0–5.0)
Cholesterol, Total: 233 mg/dL — ABNORMAL HIGH (ref 100–199)
HDL: 70 mg/dL (ref >39)
LDL Chol Calc (NIH): 151 mg/dL — ABNORMAL HIGH (ref 0–99)
Triglycerides: 70 mg/dL (ref 0–149)
VLDL Cholesterol Cal: 12 mg/dL (ref 5–40)

## 2020-04-07 LAB — COMPREHENSIVE METABOLIC PANEL
ALT: 21 IU/L (ref 0–44)
AST: 24 IU/L (ref 0–40)
Albumin/Globulin Ratio: 2.1 (ref 1.2–2.2)
Albumin: 4.4 g/dL (ref 3.8–4.8)
Alkaline Phosphatase: 63 IU/L (ref 44–121)
BUN/Creatinine Ratio: 25 — ABNORMAL HIGH (ref 10–24)
BUN: 23 mg/dL (ref 8–27)
Bilirubin Total: 0.6 mg/dL (ref 0.0–1.2)
CO2: 21 mmol/L (ref 20–29)
Calcium: 8.9 mg/dL (ref 8.6–10.2)
Chloride: 103 mmol/L (ref 96–106)
Creatinine, Ser: 0.93 mg/dL (ref 0.76–1.27)
Globulin, Total: 2.1 g/dL (ref 1.5–4.5)
Glucose: 97 mg/dL (ref 65–99)
Potassium: 4.7 mmol/L (ref 3.5–5.2)
Sodium: 138 mmol/L (ref 134–144)
Total Protein: 6.5 g/dL (ref 6.0–8.5)
eGFR: 89 mL/min/{1.73_m2} (ref >59)

## 2020-04-07 LAB — PSA: Prostate Specific Ag, Serum: <0.1 ng/mL (ref 0.0–4.0)

## 2020-04-14 ENCOUNTER — Other Ambulatory Visit: Payer: Self-pay

## 2020-04-14 ENCOUNTER — Ambulatory Visit (INDEPENDENT_AMBULATORY_CARE_PROVIDER_SITE_OTHER): Payer: Medicare HMO | Admitting: Emergency Medicine

## 2020-04-14 ENCOUNTER — Encounter: Payer: Self-pay | Admitting: Emergency Medicine

## 2020-04-14 VITALS — BP 129/85 | HR 69 | Temp 98.6°F | Resp 16 | Ht 71.0 in | Wt 203.0 lb

## 2020-04-14 DIAGNOSIS — C61 Malignant neoplasm of prostate: Secondary | ICD-10-CM | POA: Diagnosis not present

## 2020-04-14 DIAGNOSIS — Z85828 Personal history of other malignant neoplasm of skin: Secondary | ICD-10-CM | POA: Diagnosis not present

## 2020-04-14 DIAGNOSIS — Z0001 Encounter for general adult medical examination with abnormal findings: Secondary | ICD-10-CM

## 2020-04-14 DIAGNOSIS — E785 Hyperlipidemia, unspecified: Secondary | ICD-10-CM | POA: Diagnosis not present

## 2020-04-14 DIAGNOSIS — Z0000 Encounter for general adult medical examination without abnormal findings: Secondary | ICD-10-CM

## 2020-04-14 MED ORDER — ROSUVASTATIN CALCIUM 10 MG PO TABS: 10.0000 mg | ORAL_TABLET | Freq: Every day | ORAL | 3 refills | Status: DC

## 2020-04-14 NOTE — Patient Instructions (Addendum)
If you have lab work done today you will be contacted with your lab results within the next 2 weeks.  If you have not heard from [place] then please contact us. The fastest way to get your results is to register for My Chart.   IF you received an x-ray today, you will receive an invoice from [facility]. Please contact [facility] at [phone] with questions or concerns regarding your invoice.   IF you received labwork today, you will receive an invoice from [place]. Please contact [facility] at [phone] with questions or concerns regarding your invoice.   Our billing staff will not be able to assist you with questions regarding bills from these companies.  You will be contacted with the lab results as soon as they are available. The fastest way to get your results is to activate your My Chart account. Instructions are located on the last page of this paperwork. If you have not heard from [place] regarding the results in 2 weeks, please contact this office.    DR Health Maintenance After Age 19 After age 9, you are at a higher risk for certain long-term diseases and infections as well as injuries from falls. Falls are a major cause of broken bones and head injuries in people who are older than age 26. Getting regular preventive care can help to keep you healthy and well. Preventive care includes getting regular testing and making lifestyle changes as recommended by your health care provider. Talk with your health care provider about:  Which screenings and tests you should have. A screening is a test that checks for a disease when you have no symptoms.  A diet and exercise plan that is right for you. What should I know about screenings and tests to prevent falls? Screening and testing are the best ways to find a health problem early. Early diagnosis and treatment give you the best chance of managing medical conditions that are common after age 10. Certain conditions and  lifestyle choices may make you more likely to have a fall. Your health care provider may recommend:  Regular vision checks. Poor vision and conditions such as cataracts can make you more likely to have a fall. If you wear glasses, make sure to get your prescription updated if your vision changes.  Medicine review. Work with your health care provider to regularly review all of the medicines you are taking, including over-the-counter medicines. Ask your health care provider about any side effects that may make you more likely to have a fall. Tell your health care provider if any medicines that you take make you feel dizzy or sleepy.  Osteoporosis screening. Osteoporosis is a condition that causes the bones to get weaker. This can make the bones weak and cause them to break more easily.  Blood pressure screening. Blood pressure changes and medicines to control blood pressure can make you feel dizzy.  Strength and balance checks. Your health care provider may recommend certain tests to check your strength and balance while standing, walking, or changing positions.  Foot health exam. Foot pain and numbness, as well as not wearing proper footwear, can make you more likely to have a fall.  Depression screening. You may be more likely to have a fall if you have a fear of falling, feel emotionally low, or feel unable to do activities that you used to do.  Alcohol use screening. Using too much alcohol can affect your balance and may make you more likely to  have a fall. What actions can I take to lower my risk of falls? General instructions  Talk with your health care provider about your risks for falling. Tell your health care provider if: ? You fall. Be sure to tell your health care provider about all falls, even ones that seem minor. ? You feel dizzy, sleepy, or off-balance.  Take over-the-counter and prescription medicines only as told by your health care provider. These include any  supplements.  Eat a healthy diet and maintain a healthy weight. A healthy diet includes low-fat dairy products, low-fat (lean) meats, and fiber from whole grains, beans, and lots of fruits and vegetables. Home safety  Remove any tripping hazards, such as rugs, cords, and clutter.  Install safety equipment such as grab bars in bathrooms and safety rails on stairs.  Keep rooms and walkways well-lit. Activity  Follow a regular exercise program to stay fit. This will help you maintain your balance. Ask your health care provider what types of exercise are appropriate for you.  If you need a cane or walker, use it as recommended by your health care provider.  Wear supportive shoes that have nonskid soles.   Lifestyle  Do not drink alcohol if your health care provider tells you not to drink.  If you drink alcohol, limit how much you have: ? 0-1 drink a day for women. ? 0-2 drinks a day for men.  Be aware of how much alcohol is in your drink. In the [place] one drink equals one typical bottle of beer (12 oz), one-half glass of wine (5 oz), or one shot of hard liquor (1 oz).  Do not use any products that contain nicotine or tobacco, such as cigarettes and e-cigarettes. If you need help quitting, ask your health care provider. Summary  Having a healthy lifestyle and getting preventive care can help to protect your health and wellness after age 71.  Screening and testing are the best way to find a health problem early and help you avoid having a fall. Early diagnosis and treatment give you the best chance for managing medical conditions that are more common for people who are older than age 40.  Falls are a major cause of broken bones and head injuries in people who are older than age 54. Take precautions to prevent a fall at home.  Work with your health care provider to learn what changes you can make to improve your health and wellness and to prevent falls. This information is not intended  to replace advice given to you by your health care provider. Make sure you discuss any questions you have with your health care provider. Document Revised: 05/16/2018 Document Reviewed: 12/06/2016 Elsevier Patient Education  2021 [employer].

## 2020-04-14 NOTE — Progress Notes (Signed)
Allen Burnett 70 y.o.   Chief Complaint  Patient presents with  . Annual Exam    Per patient labs done 04/06/2020 with review today    HISTORY OF PRESENT ILLNESS: This is a 70 y.o. male former patient of Dr. Nolon Rod here for annual exam and transfer of care. Patient has past medical history significant for skin cancer and prostate cancer.  Otherwise healthy. Healthy lifestyle.  Exercises regularly. Occasionally gets right shoulder pain, takes meloxicam which helps a great deal. No complaints or medical concerns today. Labs done on 04/06/2020 reviewed with patient.  Normal CMP but abnormal lipid profile. The 10-year ASCVD risk score [name] DC Brooke Bonito., et al., 2013) is: 15.9%   Values used to calculate the score:     Age: 37 years     Sex: Male     Is Non-Hispanic African American: No     Diabetic: No     Tobacco smoker: No     Systolic Blood Pressure: 500 mmHg     Is BP treated: No     HDL Cholesterol: 70 mg/dL     Total Cholesterol: 233 mg/dL   HPI   Prior to Admission medications   Medication Sig Start Date End Date Taking? Authorizing Provider  cyclobenzaprine (FLEXERIL) 5 MG tablet Take 1 tablet (5 mg total) by mouth at bedtime as needed for muscle spasms. 03/12/20   Nuala Alpha, DO  meloxicam (MOBIC) 15 MG tablet Take 1 tablet (15 mg total) by mouth daily as needed for pain. 03/12/20   Nuala Alpha, DO  Multiple Vitamin (MULTIVITAMIN WITH MINERALS) TABS tablet Take 1 tablet by mouth daily.    [provider]  venlafaxine (EFFEXOR) 25 MG tablet Take 25 mg by mouth 2 (two) times daily.    [provider]    No Known Allergies  Patient Active Problem List   Diagnosis Date Noted  . Pain of right thumb 03/12/2020  . Neck pain on right side 03/12/2020  . Right leg pain 10/07/2015  . Insomnia 10/29/2013  . Melanoma of skin, site unspecified 10/29/2013  . PVC (premature ventricular contraction) 08/06/2013  . Cluster headache 10/04/2012  . Hyperlipidemia  10/02/2012  . Prostate cancer ([place]) 11/08/2011    Past Medical History:  Diagnosis Date  . Acid reflux OCCASIONALLY--  TAKE PREVACID  . Atypical nevus 02/20/2006   slight to moderate - right paraspinal, lower back  . Atypical nevus 10/01/2013   moderate to severe - upper paraspinal (WS)  . Atypical nevus 10/01/2013   moderate - right upper paraspinal (WS)  . Atypical nevus 04/27/2016   moderate - right upper back  . Basal cell carcinoma 02/20/2006   left medial chest  . Basal cell carcinoma 11/05/2013   right upper paraspinal (recheck in 3 months)  . Chronic cluster headache FOLLOWED AT [facility]   PRN OXYGEN VIA [place] WHEN FEELS HEADACHE STARTING/  ON PREVENTION MEDS  . Halo nevus 03/30/2016   moderate - right upper back (WS)  . Hx of radiation therapy 12/28/11   prostate seed implant/75 seeds/,Dr.Dahlstedt  . Hyperlipidemia   . 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 Melanoma ([place]) 02/20/2006   left lateral chest - excision  . Melanoma in situ ([place]) 07/27/2009   right lobe of ear  . Melanoma in situ ([place]) 10/01/2013   left jawline [facility])  . Melanoma in situ ([place]) 10/01/2013   left cheek [facility])  . Prostate cancer ([place]) DX  10/11/11   Adenocarcinoma,gleason 3+3=6,PSA=5.39   volume=30cc  . PSA elevation    2009=3.4,2010=3.46,2011=3.16,2012=3.76,07/2011=4.85  . SCCA (squamous cell carcinoma) of skin 03/03/2020   Right Lower Shin ([place]atoacanthoma)  . SCCA (squamous cell carcinoma) of skin 03/03/2020   Right Dorsal Hand (in situ)  . 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 Squamous cell carcinoma of skin 10/26/2015   Right thigh, proximal  . Squamous cell carcinoma, face 10/01/2013   Right forehead - CX3 + 5FU  . Squamous cell carcinoma, face 11/15/2017   Right bulb nose - tx p bx    Past Surgical History:  Procedure Laterality Date  . APPENDECTOMY  AGE 50  . PROSTATE BIOPSY  10/11/11   gleason  3+3=6,PSA=5.39,Adenocarcinoma  . PROSTATE SURGERY    . RADIOACTIVE SEED IMPLANT  12/28/2011   Procedure: RADIOACTIVE SEED IMPLANT;  Surgeon: Franchot Gallo, MD;  Location: [facility];  Service: Urology;  Laterality: N/A;  75 seeds implanted  no seeds found in bladder    Social History   Socioeconomic History  . Marital status: Married    Spouse name: Not on file  . Number of children: 2  . Years of education: Not on file  . Highest education level: Not on file  Occupational History    Comment: advertising sales/ retired  . Occupation: golf [employer] and [employer]    Comment: Part time  Tobacco Use  . Smoking status: Former Smoker    Packs/day: 1.00    Years: 10.00    Pack years: 10.00    Types: Cigarettes    Quit date: 07/24/1984    Years since quitting: 35.7  . Smokeless tobacco: Former Systems developer    Types: Chew  Substance and Sexual Activity  . Alcohol use: Yes    Alcohol/week: 6.0 - 8.0 standard drinks    Types: 6 - 8 Standard drinks or equivalent per week    Comment: beer on weekends  . Drug use: No  . Sexual activity: Yes    Partners: Female  Other Topics Concern  . Not on file  Social History Narrative  . Not on file   Social Determinants of Health   Financial Resource Strain: Not on file  Food Insecurity: Not on file  Transportation Needs: Not on file  Physical Activity: Not on file  Stress: Not on file  Social Connections: Not on file  Intimate Partner Violence: Not on file    Family History  Problem Relation Age of Onset  . Cancer Brother 25       colon cancer now age 56  . Hyperlipidemia Mother   . Heart disease Maternal Grandfather   . Cancer Brother      Review of Systems  Constitutional: Negative.  Negative for chills and fever.  HENT: Negative.  Negative for congestion and sore throat.   Respiratory: Negative.  Negative for cough and shortness of breath.   Cardiovascular: Negative.  Negative for chest pain and palpitations.   Gastrointestinal: Negative.  Negative for abdominal pain, diarrhea, nausea and vomiting.  Genitourinary: Negative.  Negative for dysuria and hematuria.  Musculoskeletal: Positive for joint pain (Right shoulder). Negative for myalgias.  Skin: Negative.  Negative for rash.  Neurological: Negative.  Negative for dizziness and headaches.  All other systems reviewed and are negative.    Physical Exam Vitals reviewed.  Constitutional:      Appearance: Normal appearance.  HENT:     Head: Normocephalic.  Mouth/Throat:     Mouth: Mucous membranes are moist.     Pharynx: Oropharynx is clear.  Eyes:     Extraocular Movements: Extraocular movements intact.     Conjunctiva/sclera: Conjunctivae normal.     Pupils: Pupils are equal, round, and reactive to light.  Neck:     Vascular: No carotid bruit.  Cardiovascular:     Rate and Rhythm: Normal rate and regular rhythm.     Pulses: Normal pulses.     Heart sounds: Normal heart sounds.  Pulmonary:     Effort: Pulmonary effort is normal.     Breath sounds: Normal breath sounds.  Abdominal:     General: Bowel sounds are normal. There is no distension.     Palpations: Abdomen is soft.     Tenderness: There is no abdominal tenderness.  Musculoskeletal:        General: Normal range of motion.     Cervical back: Normal range of motion and neck supple. No tenderness.     Right lower leg: No edema.     Left lower leg: No edema.  Lymphadenopathy:     Cervical: No cervical adenopathy.  Skin:    General: Skin is warm and dry.     Capillary Refill: Capillary refill takes less than 2 seconds.  Neurological:     General: No focal deficit present.     Mental Status: He is alert and oriented to person, place, and time.  Psychiatric:        Mood and Affect: Mood normal.        Behavior: Behavior normal.    Recent Results (from the past 2160 hour(s))  PSA     Status: None   Collection Time: 04/06/20  9:52 AM  Result Value Ref Range    Prostate Specific Ag, Serum <0.1 0.0 - 4.0 ng/mL    Comment: Roche ECLIA methodology. According to the American Urological Association, Serum PSA should decrease and remain at undetectable levels after radical prostatectomy. The AUA defines biochemical recurrence as an initial PSA value 0.2 ng/mL or greater followed by a subsequent confirmatory PSA value 0.2 ng/mL or greater. Values obtained with different assay methods or kits cannot be used interchangeably. Results cannot be interpreted as absolute evidence of the presence or absence of malignant disease.   Lipid panel     Status: Abnormal   Collection Time: 04/06/20  9:52 AM  Result Value Ref Range   Cholesterol, Total 233 (H) 100 - 199 mg/dL   Triglycerides 70 0 - 149 mg/dL   HDL 70 >39 mg/dL   VLDL Cholesterol Cal 12 5 - 40 mg/dL   LDL Chol Calc (NIH) 151 (H) 0 - 99 mg/dL   Chol/HDL Ratio 3.3 0.0 - 5.0 ratio    Comment:                                   T. Chol/HDL Ratio                                             Men  Women                               1/2 Avg.Risk  3.4    3.3  Avg.Risk  5.0    4.4                                2X Avg.Risk  9.6    7.1                                3X Avg.Risk 23.4   11.0   Comprehensive metabolic panel     Status: Abnormal   Collection Time: 04/06/20  9:52 AM  Result Value Ref Range   Glucose 97 65 - 99 mg/dL   BUN 23 8 - 27 mg/dL   Creatinine, Ser 0.93 0.76 - 1.27 mg/dL   eGFR 89 >59 mL/min/1.73    Comment: **In accordance with recommendations from the NKF-ASN Task force,**   [facility] has updated its eGFR calculation to the 2021 CKD-EPI   creatinine equation that estimates kidney function without a race   variable.    BUN/Creatinine Ratio 25 (H) 10 - 24   Sodium 138 134 - 144 mmol/L   Potassium 4.7 3.5 - 5.2 mmol/L   Chloride 103 96 - 106 mmol/L   CO2 21 20 - 29 mmol/L   Calcium 8.9 8.6 - 10.2 mg/dL   Total Protein 6.5 6.0 - 8.5 g/dL   Albumin  4.4 3.8 - 4.8 g/dL   Globulin, Total 2.1 1.5 - 4.5 g/dL   Albumin/Globulin Ratio 2.1 1.2 - 2.2   Bilirubin Total 0.6 0.0 - 1.2 mg/dL   Alkaline Phosphatase 63 44 - 121 IU/L   AST 24 0 - 40 IU/L   ALT 21 0 - 44 IU/L     ASSESSMENT & PLAN: Eran was seen today for annual exam.  Diagnoses and all orders for this visit:  Routine general medical examination at a health care facility  Dyslipidemia -     rosuvastatin (CRESTOR) 10 MG tablet; Take 1 tablet (10 mg total) by mouth daily.  Prostate cancer [facility])  History of skin cancer    Patient Instructions       If you have lab work done today you will be contacted with your lab results within the next 2 weeks.  If you have not heard from [place] then please contact us. The fastest way to get your results is to register for My Chart.   IF you received an x-ray today, you will receive an invoice from [facility]. Please contact [facility] at [phone] with questions or concerns regarding your invoice.   IF you received labwork today, you will receive an invoice from [facility]. Please contact [facility] at [phone] with questions or concerns regarding your invoice.   Our billing staff will not be able to assist you with questions regarding bills from these companies.  You will be contacted with the lab results as soon as they are available. The fastest way to get your results is to activate your My Chart account. Instructions are located on the last page of this paperwork. If you have not heard from [place] regarding the results in 2 weeks, please contact this office.    DR Health Maintenance After Age 43 After age 32, you are at a higher risk for certain long-term diseases and infections as well as injuries from falls. Falls are a major cause of broken bones and head injuries in people who are older than age 12. Getting regular preventive care can help to  keep you healthy and well. Preventive care includes getting  regular testing and making lifestyle changes as recommended by your health care provider. Talk with your health care provider about:  Which screenings and tests you should have. A screening is a test that checks for a disease when you have no symptoms.  A diet and exercise plan that is right for you. What should I know about screenings and tests to prevent falls? Screening and testing are the best ways to find a health problem early. Early diagnosis and treatment give you the best chance of managing medical conditions that are common after age 48. Certain conditions and lifestyle choices may make you more likely to have a fall. Your health care provider may recommend:  Regular vision checks. Poor vision and conditions such as cataracts can make you more likely to have a fall. If you wear glasses, make sure to get your prescription updated if your vision changes.  Medicine review. Work with your health care provider to regularly review all of the medicines you are taking, including over-the-counter medicines. Ask your health care provider about any side effects that may make you more likely to have a fall. Tell your health care provider if any medicines that you take make you feel dizzy or sleepy.  Osteoporosis screening. Osteoporosis is a condition that causes the bones to get weaker. This can make the bones weak and cause them to break more easily.  Blood pressure screening. Blood pressure changes and medicines to control blood pressure can make you feel dizzy.  Strength and balance checks. Your health care provider may recommend certain tests to check your strength and balance while standing, walking, or changing positions.  Foot health exam. Foot pain and numbness, as well as not wearing proper footwear, can make you more likely to have a fall.  Depression screening. You may be more likely to have a fall if you have a fear of falling, feel emotionally low, or feel unable to do activities that  you used to do.  Alcohol use screening. Using too much alcohol can affect your balance and may make you more likely to have a fall. What actions can I take to lower my risk of falls? General instructions  Talk with your health care provider about your risks for falling. Tell your health care provider if: ? You fall. Be sure to tell your health care provider about all falls, even ones that seem minor. ? You feel dizzy, sleepy, or off-balance.  Take over-the-counter and prescription medicines only as told by your health care provider. These include any supplements.  Eat a healthy diet and maintain a healthy weight. A healthy diet includes low-fat dairy products, low-fat (lean) meats, and fiber from whole grains, beans, and lots of fruits and vegetables. Home safety  Remove any tripping hazards, such as rugs, cords, and clutter.  Install safety equipment such as grab bars in bathrooms and safety rails on stairs.  Keep rooms and walkways well-lit. Activity  Follow a regular exercise program to stay fit. This will help you maintain your balance. Ask your health care provider what types of exercise are appropriate for you.  If you need a cane or walker, use it as recommended by your health care provider.  Wear supportive shoes that have nonskid soles.   Lifestyle  Do not drink alcohol if your health care provider tells you not to drink.  If you drink alcohol, limit how much you have: ? 0-1 drink a day for  women. ? 0-2 drinks a day for men.  Be aware of how much alcohol is in your drink. In the [place] one drink equals one typical bottle of beer (12 oz), one-half glass of wine (5 oz), or one shot of hard liquor (1 oz).  Do not use any products that contain nicotine or tobacco, such as cigarettes and e-cigarettes. If you need help quitting, ask your health care provider. Summary  Having a healthy lifestyle and getting preventive care can help to protect your health and wellness after  age 65.  Screening and testing are the best way to find a health problem early and help you avoid having a fall. Early diagnosis and treatment give you the best chance for managing medical conditions that are more common for people who are older than age 5.  Falls are a major cause of broken bones and head injuries in people who are older than age 65. Take precautions to prevent a fall at home.  Work with your health care provider to learn what changes you can make to improve your health and wellness and to prevent falls. This information is not intended to replace advice given to you by your health care provider. Make sure you discuss any questions you have with your health care provider. Document Revised: 05/16/2018 Document Reviewed: 12/06/2016 Elsevier Patient Education  2021 Elsevier Inc.      Agustina Caroli, MD [facility]

## 2020-05-04 ENCOUNTER — Other Ambulatory Visit: Payer: Self-pay | Admitting: Family Medicine

## 2020-05-27 ENCOUNTER — Ambulatory Visit (INDEPENDENT_AMBULATORY_CARE_PROVIDER_SITE_OTHER): Payer: Medicare HMO | Admitting: Physician Assistant

## 2020-05-27 ENCOUNTER — Other Ambulatory Visit: Payer: Self-pay

## 2020-05-27 DIAGNOSIS — C44722 Squamous cell carcinoma of skin of right lower limb, including hip: Secondary | ICD-10-CM | POA: Diagnosis not present

## 2020-05-27 DIAGNOSIS — D0461 Carcinoma in situ of skin of right upper limb, including shoulder: Secondary | ICD-10-CM | POA: Diagnosis not present

## 2020-05-27 DIAGNOSIS — D049 Carcinoma in situ of skin, unspecified: Secondary | ICD-10-CM

## 2020-05-27 NOTE — Patient Instructions (Signed)

## 2020-06-18 ENCOUNTER — Encounter: Payer: Self-pay | Admitting: Physician Assistant

## 2020-06-18 NOTE — Progress Notes (Signed)
   Follow-Up Visit   Subjective  Allen Burnett is a 70 y.o. male who presents for the following: Procedure (Patient here for treatment on right lower shin KA and right dorsal hand CIS. ).   The following portions of the chart were reviewed this encounter and updated as appropriate:      Objective  Well appearing patient in no apparent distress; mood and affect are within normal limits.  A focused examination was performed including hands and shins. Relevant physical exam findings are noted in the Assessment and Plan.  Objective  Right Dorsal Hand: Scaly pink papule or plaque.   Objective  Right Lower shin: Scaly pink macule  Assessment & Plan  Carcinoma in situ of skin of right upper extremity including shoulder Right Dorsal Hand  Destruction of lesion Complexity: simple   Destruction method: electrodesiccation and curettage   Informed consent: discussed and consent obtained   Timeout:  patient name, date of birth, surgical site, and procedure verified Anesthesia: the lesion was anesthetized in a standard fashion   Anesthetic:  1% lidocaine w/ epinephrine 1-100,000 local infiltration Curettage performed in three different directions: Yes   Curettage cycles:  3 Final wound size (cm):  1.2 Hemostasis achieved with:  ferric subsulfate Outcome: patient tolerated procedure well with no complications   Additional details:  Wound innoculated with 5 fluorouracil solution.  SCC (squamous cell carcinoma), leg, right Right Lower shin  Destruction of lesion Complexity: simple   Destruction method: electrodesiccation and curettage   Informed consent: discussed and consent obtained   Timeout:  patient name, date of birth, surgical site, and procedure verified Anesthesia: the lesion was anesthetized in a standard fashion   Anesthetic:  1% lidocaine w/ epinephrine 1-100,000 local infiltration Curettage performed in three different directions: Yes   Electrodesiccation performed over  the curetted area: Yes   Curettage cycles:  3 Margin per side (cm):  0.1 Final wound size (cm):  2 Hemostasis achieved with:  aluminum chloride Outcome: patient tolerated procedure well with no complications   Post-procedure details: wound care instructions given     I, Cloria Ciresi, PA-C, have reviewed all documentation's for this visit.  The documentation on 06/18/20 for the exam, diagnosis, procedures and orders are all accurate and complete.

## 2020-08-30 DIAGNOSIS — Z8249 Family history of ischemic heart disease and other diseases of the circulatory system: Secondary | ICD-10-CM | POA: Diagnosis not present

## 2020-08-30 DIAGNOSIS — Z8546 Personal history of malignant neoplasm of prostate: Secondary | ICD-10-CM | POA: Diagnosis not present

## 2020-08-30 DIAGNOSIS — H547 Unspecified visual loss: Secondary | ICD-10-CM | POA: Diagnosis not present

## 2020-08-30 DIAGNOSIS — E785 Hyperlipidemia, unspecified: Secondary | ICD-10-CM | POA: Diagnosis not present

## 2020-08-30 DIAGNOSIS — Z823 Family history of stroke: Secondary | ICD-10-CM | POA: Diagnosis not present

## 2020-08-30 DIAGNOSIS — Z809 Family history of malignant neoplasm, unspecified: Secondary | ICD-10-CM | POA: Diagnosis not present

## 2020-08-30 DIAGNOSIS — Z87891 Personal history of nicotine dependence: Secondary | ICD-10-CM | POA: Diagnosis not present

## 2020-11-01 DIAGNOSIS — H2513 Age-related nuclear cataract, bilateral: Secondary | ICD-10-CM | POA: Diagnosis not present

## 2020-11-01 DIAGNOSIS — Z0100 Encounter for examination of eyes and vision without abnormal findings: Secondary | ICD-10-CM | POA: Diagnosis not present

## 2020-11-23 ENCOUNTER — Ambulatory Visit: Payer: Medicare HMO | Admitting: Physician Assistant

## 2020-11-30 ENCOUNTER — Ambulatory Visit: Payer: Medicare HMO | Admitting: Physician Assistant

## 2020-11-30 ENCOUNTER — Encounter: Payer: Self-pay | Admitting: Physician Assistant

## 2020-11-30 ENCOUNTER — Other Ambulatory Visit: Payer: Self-pay

## 2020-11-30 DIAGNOSIS — Z85828 Personal history of other malignant neoplasm of skin: Secondary | ICD-10-CM | POA: Diagnosis not present

## 2020-11-30 DIAGNOSIS — Z85820 Personal history of malignant melanoma of skin: Secondary | ICD-10-CM | POA: Diagnosis not present

## 2020-11-30 DIAGNOSIS — L570 Actinic keratosis: Secondary | ICD-10-CM

## 2020-11-30 DIAGNOSIS — Z1283 Encounter for screening for malignant neoplasm of skin: Secondary | ICD-10-CM | POA: Diagnosis not present

## 2020-11-30 DIAGNOSIS — Z86018 Personal history of other benign neoplasm: Secondary | ICD-10-CM | POA: Diagnosis not present

## 2020-11-30 MED ORDER — TOLAK 4 % EX CREA: TOPICAL_CREAM | CUTANEOUS | 0 refills | Status: DC

## 2020-11-30 NOTE — Progress Notes (Signed)
   Follow-Up Visit   Subjective  Allen Burnett is a 70 y.o. male who presents for the following: Annual Exam (No new concerns. Personal history of melanoma, atypical nevi & non mole skin cancer. Patient denies any family history of melanoma or non mole skin cancer.).   The following portions of the chart were reviewed this encounter and updated as appropriate:  Tobacco  Allergies  Meds  Problems  Med Hx  Surg Hx  Fam Hx      Objective  Well appearing patient in no apparent distress; mood and affect are within normal limits.  A full examination was performed including scalp, head, eyes, ears, nose, lips, neck, chest, axillae, abdomen, back, buttocks, bilateral upper extremities, bilateral lower extremities, hands, feet, fingers, toes, fingernails, and toenails. All findings within normal limits unless otherwise noted below.  Head - Anterior (Face) (3), Left Anterior Neck (4), Left Ear, Mid Forehead (2), Right Ear Erythematous patches with gritty scale.     Assessment & Plan  AK (actinic keratosis) (11) Head - Anterior (Face) (3); Left Ear; Right Ear; Mid Forehead (2); Left Anterior Neck (4)  Destruction of lesion - Head - Anterior (Face), Left Anterior Neck, Left Ear, Mid Forehead, Right Ear Complexity: simple   Destruction method: cryotherapy   Informed consent: discussed and consent obtained   Timeout:  patient name, date of birth, surgical site, and procedure verified Lesion destroyed using liquid nitrogen: Yes   Cryotherapy cycles:  1 Outcome: patient tolerated procedure well with no complications   Post-procedure details: wound care instructions given    Fluorouracil (TOLAK) 4 % CREA - Head - Anterior (Face), Left Anterior Neck, Left Ear, Mid Forehead, Right Ear Apply at affected area nightly x 2 weeks.   I, Seyon Strader, PA-C, have reviewed all documentation's for this visit.  The documentation on 11/30/20 for the exam, diagnosis, procedures and orders are all  accurate and complete.

## 2020-11-30 NOTE — Patient Instructions (Signed)
If you have not heard from [facility] by the end of the business day today give them a call tomorrow @ [phone]

## 2021-02-11 ENCOUNTER — Telehealth: Payer: Self-pay

## 2021-02-11 DIAGNOSIS — E785 Hyperlipidemia, unspecified: Secondary | ICD-10-CM | POA: Diagnosis not present

## 2021-02-11 DIAGNOSIS — Z8546 Personal history of malignant neoplasm of prostate: Secondary | ICD-10-CM | POA: Diagnosis not present

## 2021-02-11 DIAGNOSIS — Z8249 Family history of ischemic heart disease and other diseases of the circulatory system: Secondary | ICD-10-CM | POA: Diagnosis not present

## 2021-02-11 DIAGNOSIS — Z809 Family history of malignant neoplasm, unspecified: Secondary | ICD-10-CM | POA: Diagnosis not present

## 2021-02-11 DIAGNOSIS — Z7722 Contact with and (suspected) exposure to environmental tobacco smoke (acute) (chronic): Secondary | ICD-10-CM | POA: Diagnosis not present

## 2021-02-11 DIAGNOSIS — R32 Unspecified urinary incontinence: Secondary | ICD-10-CM | POA: Diagnosis not present

## 2021-02-11 DIAGNOSIS — Z85828 Personal history of other malignant neoplasm of skin: Secondary | ICD-10-CM | POA: Diagnosis not present

## 2021-02-11 DIAGNOSIS — Z0000 Encounter for general adult medical examination without abnormal findings: Secondary | ICD-10-CM

## 2021-02-11 DIAGNOSIS — Z823 Family history of stroke: Secondary | ICD-10-CM | POA: Diagnosis not present

## 2021-02-11 DIAGNOSIS — M1990 Unspecified osteoarthritis, unspecified site: Secondary | ICD-10-CM | POA: Diagnosis not present

## 2021-02-11 DIAGNOSIS — Z87891 Personal history of nicotine dependence: Secondary | ICD-10-CM | POA: Diagnosis not present

## 2021-02-11 NOTE — Telephone Encounter (Signed)
Pt called in to schedule his Physical and would like to know if he can go get his fasting labs done a few days before his appt 04/19/21 so the results can be discussed at his appt.  Please advise pt [phone]

## 2021-02-15 NOTE — Telephone Encounter (Signed)
yes

## 2021-02-15 NOTE — Telephone Encounter (Signed)
Patient requesting order for labs prior to cpe appt on 04-19-2021  Advised patient due to his insurance, medicare only covers cost of labs during or after ov  Patient understood advice and would still like to proceed w/ having labs done prior to cpe

## 2021-02-16 NOTE — Telephone Encounter (Signed)
Patient requested to have labs done before physical OV. Placed lab orders and sent pt a my chart message to schedule lab appt.

## 2021-03-03 ENCOUNTER — Ambulatory Visit: Payer: Medicare HMO | Admitting: Physician Assistant

## 2021-04-12 ENCOUNTER — Other Ambulatory Visit (INDEPENDENT_AMBULATORY_CARE_PROVIDER_SITE_OTHER): Payer: Medicare HMO

## 2021-04-12 ENCOUNTER — Other Ambulatory Visit: Payer: Self-pay

## 2021-04-12 DIAGNOSIS — Z0000 Encounter for general adult medical examination without abnormal findings: Secondary | ICD-10-CM

## 2021-04-12 LAB — COMPREHENSIVE METABOLIC PANEL
ALT: 28 U/L (ref 0–53)
AST: 25 U/L (ref 0–37)
Albumin: 4.2 g/dL (ref 3.5–5.2)
Alkaline Phosphatase: 56 U/L (ref 39–117)
BUN: 16 mg/dL (ref 6–23)
CO2: 27 mEq/L (ref 19–32)
Calcium: 8.9 mg/dL (ref 8.4–10.5)
Chloride: 106 mEq/L (ref 96–112)
Creatinine, Ser: 0.96 mg/dL (ref 0.40–1.50)
GFR: 80.35 mL/min (ref >60.00)
Glucose, Bld: 97 mg/dL (ref 70–99)
Potassium: 4.5 mEq/L (ref 3.5–5.1)
Sodium: 139 mEq/L (ref 135–145)
Total Bilirubin: 0.6 mg/dL (ref 0.2–1.2)
Total Protein: 6.4 g/dL (ref 6.0–8.3)

## 2021-04-12 LAB — PSA: PSA: 0 ng/mL — ABNORMAL LOW (ref 0.10–4.00)

## 2021-04-12 LAB — LIPID PANEL
Cholesterol: 145 mg/dL (ref 0–200)
HDL: 66.3 mg/dL (ref >39.00)
LDL Cholesterol: 59 mg/dL (ref 0–99)
NonHDL: 78.84
Total CHOL/HDL Ratio: 2
Triglycerides: 97 mg/dL (ref 0.0–149.0)
VLDL: 19.4 mg/dL (ref 0.0–40.0)

## 2021-04-19 ENCOUNTER — Ambulatory Visit (INDEPENDENT_AMBULATORY_CARE_PROVIDER_SITE_OTHER): Payer: Medicare HMO | Admitting: Emergency Medicine

## 2021-04-19 ENCOUNTER — Encounter: Payer: Self-pay | Admitting: Emergency Medicine

## 2021-04-19 ENCOUNTER — Other Ambulatory Visit: Payer: Self-pay

## 2021-04-19 VITALS — BP 132/68 | HR 60 | Ht 71.0 in | Wt 207.0 lb

## 2021-04-19 DIAGNOSIS — G4700 Insomnia, unspecified: Secondary | ICD-10-CM

## 2021-04-19 DIAGNOSIS — M79644 Pain in right finger(s): Secondary | ICD-10-CM | POA: Diagnosis not present

## 2021-04-19 DIAGNOSIS — Z8546 Personal history of malignant neoplasm of prostate: Secondary | ICD-10-CM

## 2021-04-19 DIAGNOSIS — E785 Hyperlipidemia, unspecified: Secondary | ICD-10-CM

## 2021-04-19 DIAGNOSIS — M25512 Pain in left shoulder: Secondary | ICD-10-CM | POA: Diagnosis not present

## 2021-04-19 DIAGNOSIS — Z0001 Encounter for general adult medical examination with abnormal findings: Secondary | ICD-10-CM | POA: Diagnosis not present

## 2021-04-19 DIAGNOSIS — Z800 Family history of malignant neoplasm of digestive organs: Secondary | ICD-10-CM | POA: Diagnosis not present

## 2021-04-19 DIAGNOSIS — Z85820 Personal history of malignant melanoma of skin: Secondary | ICD-10-CM | POA: Diagnosis not present

## 2021-04-19 DIAGNOSIS — G8929 Other chronic pain: Secondary | ICD-10-CM

## 2021-04-19 DIAGNOSIS — Z1211 Encounter for screening for malignant neoplasm of colon: Secondary | ICD-10-CM | POA: Diagnosis not present

## 2021-04-19 MED ORDER — ZOLPIDEM TARTRATE 5 MG PO TABS: 5.0000 mg | ORAL_TABLET | Freq: Every evening | ORAL | 1 refills | Status: DC | PRN

## 2021-04-19 NOTE — Patient Instructions (Signed)

## 2021-04-19 NOTE — Progress Notes (Signed)
Allen Burnett ?71 y.o. ? ? ?Chief Complaint  ?Patient presents with  ? Annual Exam  ?  Concerns about crestor  ? ? ?HISTORY OF PRESENT ILLNESS: ?This is a 71 y.o. male here for annual exam. ?Doing well. ?Past medical history includes prostate cancer and melanoma.  Doing well. ?Has been having some trouble falling asleep.  Inquiring about sleep medication. ?Also complaining of chronic pain to right thumb and left shoulder.  Has limited range of motion of the left shoulder. ?Has some questions about rosuvastatin.  Normal lipid profile done last week.  Presently taking rosuvastatin 10 mg.  Asking if he could decrease dose to 5 mg. ?No other complaints or medical concerns today. ?Lab Results  ?Component Value Date  ? CHOL 145 04/12/2021  ? HDL 66.30 04/12/2021  ? LDLCALC 59 04/12/2021  ? TRIG 97.0 04/12/2021  ? CHOLHDL 2 04/12/2021  ? ?Recent blood results reviewed with patient.  Normal CMP, normal PSA, normal lipid profile, ? ?HPI ? ? ?Prior to Admission medications   ?Medication Sig Start Date End Date Taking? Authorizing Provider  ?rosuvastatin (CRESTOR) 10 MG tablet Take 1 tablet (10 mg total) by mouth daily. 04/14/20  Yes Sosha Shepherd, Ines Bloomer, MD  ?Fluorouracil (TOLAK) 4 % CREA Apply at affected area nightly x 2 weeks. 11/30/20   Warren Danes, PA-C  ?Multiple Vitamin (MULTIVITAMIN WITH MINERALS) TABS tablet Take 1 tablet by mouth daily.    [provider]  ?venlafaxine (EFFEXOR) 25 MG tablet Take 25 mg by mouth 2 (two) times daily. ?Patient not taking: Reported on 11/30/2020    [provider]  ? ? ?No Known Allergies ? ?Patient Active Problem List  ? Diagnosis Date Noted  ? Melanoma of skin, site unspecified 10/29/2013  ? Hyperlipidemia 10/02/2012  ? Prostate cancer ([place]) 11/08/2011  ? ? ?Past Medical History:  ?Diagnosis Date  ? Acid reflux OCCASIONALLY--  TAKE PREVACID  ? Atypical nevus 02/20/2006  ? slight to moderate - right paraspinal, lower back  ? Atypical nevus 10/01/2013  ? moderate  to severe - upper paraspinal (WS)  ? Atypical nevus 10/01/2013  ? moderate - right upper paraspinal (WS)  ? Atypical nevus 04/27/2016  ? moderate - right upper back  ? Basal cell carcinoma 02/20/2006  ? left medial chest  ? Basal cell carcinoma 11/05/2013  ? right upper paraspinal (recheck in 3 months)  ? Chronic cluster headache FOLLOWED AT [place]  ? PRN OXYGEN VIA [place] WHEN FEELS HEADACHE STARTING/  ON PREVENTION MEDS  ? Halo nevus 03/30/2016  ? moderate - right upper back (WS)  ? Hx of radiation therapy 12/28/11  ? prostate seed implant/75 seeds/,Dr.Dahlstedt  ? Hyperlipidemia   ? Lentigo maligna ([place]) 01/08/1998  ? right angle of jaw - excision  ? Melanoma ([place]) 02/20/2006  ? left lateral chest - excision  ? Melanoma in situ ([place]) 07/27/2009  ? right lobe of ear  ? Melanoma in situ ([place]) 10/01/2013  ? left jawline [facility])  ? Melanoma in situ ([place]) 10/01/2013  ? left cheek [facility])  ? Prostate cancer ([place]) DX  10/11/11  ? Adenocarcinoma,gleason 3+3=6,PSA=5.39   volume=30cc  ? PSA elevation   ? 2009=3.4,2010=3.46,2011=3.16,2012=3.76,07/2011=4.85  ? SCCA (squamous cell carcinoma) of skin 03/03/2020  ? Right Lower Shin (Keratoacanthoma)  ? SCCA (squamous cell carcinoma) of skin 03/03/2020  ? Right Dorsal Hand (in situ)  ? Squamous cell carcinoma of dorsum of right hand 08/18/1997  ? CX3 + 5FU  ? Squamous cell carcinoma  of skin 10/26/2015  ? Right thigh, proximal  ? Squamous cell carcinoma, face 10/01/2013  ? Right forehead - CX3 + 5FU  ? Squamous cell carcinoma, face 11/15/2017  ? Right bulb nose - tx p bx  ? ? ?Past Surgical History:  ?Procedure Laterality Date  ? APPENDECTOMY  AGE 33  ? PROSTATE BIOPSY  10/11/11  ? gleason 3+3=6,PSA=5.39,Adenocarcinoma  ? PROSTATE SURGERY    ? RADIOACTIVE SEED IMPLANT  12/28/2011  ? Procedure: RADIOACTIVE SEED IMPLANT;  Surgeon: Franchot Gallo, MD;  Location: [facility];  Service: Urology;  Laterality: N/A;  75 seeds implanted  ?no seeds found in bladder   ? ? ?Social History  ? ?Socioeconomic History  ? Marital status: Married  ?  Spouse name: Not on file  ? Number of children: 2  ? Years of education: Not on file  ? Highest education level: Not on file  ?Occupational History  ?  Comment: advertising sales/ retired  ? Occupation: [employer]  ?  Comment: Part time  ?Tobacco Use  ? Smoking status: Former  ?  Packs/day: 1.00  ?  Years: 10.00  ?  Pack years: 10.00  ?  Types: Cigarettes  ?  Quit date: 07/24/1984  ?  Years since quitting: 36.7  ? Smokeless tobacco: Former  ?  Types: Chew  ?Substance and Sexual Activity  ? Alcohol use: Yes  ?  Alcohol/week: 6.0 - 8.0 standard drinks  ?  Types: 6 - 8 Standard drinks or equivalent per week  ?  Comment: beer on weekends  ? Drug use: No  ? Sexual activity: Yes  ?  Partners: Female  ?Other Topics Concern  ? Not on file  ?Social History Narrative  ? Not on file  ? ?Social Determinants of Health  ? ?Financial Resource Strain: Not on file  ?Food Insecurity: Not on file  ?Transportation Needs: Not on file  ?Physical Activity: Not on file  ?Stress: Not on file  ?Social Connections: Not on file  ?Intimate Partner Violence: Not on file  ? ? ?Family History  ?Problem Relation Age of Onset  ? Cancer Brother 16  ?     colon cancer now age 51  ? Hyperlipidemia Mother   ? Heart disease Maternal Grandfather   ? Cancer Brother   ? ? ? ?Review of Systems  ?Constitutional: Negative.  Negative for chills and fever.  ?HENT: Negative.  Negative for congestion and sore throat.   ?Respiratory: Negative.  Negative for cough and shortness of breath.   ?Cardiovascular: Negative.  Negative for chest pain and palpitations.  ?Gastrointestinal:  Negative for abdominal pain, diarrhea, nausea and vomiting.  ?Genitourinary: Negative.  Negative for dysuria and hematuria.  ?Musculoskeletal:  Positive for joint pain (Left shoulder pain, right thumb pain).  ?Skin: Negative.  Negative for rash.  ?Neurological: Negative.  Negative for dizziness and  headaches.  ?Psychiatric/Behavioral:  The patient has insomnia.   ?All other systems reviewed and are negative. ?Today's Vitals  ? 04/19/21 0826  ?BP: 132/68  ?Pulse: 60  ?SpO2: 98%  ?Weight: 207 lb (93.9 kg)  ?Height: '5\' 11"'$  (1.803 m)  ? ?Body mass index is 28.87 kg/m?. ? ? ?Physical Exam ?Vitals reviewed.  ?Constitutional:   ?   Appearance: Normal appearance.  ?HENT:  ?   Head: Normocephalic.  ?   Right Ear: Tympanic membrane, ear canal and external ear normal.  ?   Left Ear: Tympanic membrane, ear canal and external ear normal.  ?  Mouth/Throat:  ?   Mouth: Mucous membranes are moist.  ?   Pharynx: Oropharynx is clear.  ?Cardiovascular:  ?   Rate and Rhythm: Normal rate and regular rhythm.  ?   Pulses: Normal pulses.  ?   Heart sounds: Normal heart sounds.  ?Pulmonary:  ?   Effort: Pulmonary effort is normal.  ?   Breath sounds: Normal breath sounds.  ?Abdominal:  ?   General: Bowel sounds are normal. There is no distension.  ?   Palpations: Abdomen is soft. There is no mass.  ?   Tenderness: There is no abdominal tenderness.  ?Musculoskeletal:  ?   Cervical back: Normal range of motion and neck supple. No tenderness.  ?   Right lower leg: No edema.  ?   Left lower leg: No edema.  ?   Comments: Left shoulder: Decreased range of motion due to pain ?Right thumb: Positive medial tenderness.  Full range of motion.  No obvious deformity.  ?Lymphadenopathy:  ?   Cervical: No cervical adenopathy.  ?Skin: ?   General: Skin is warm and dry.  ?   Capillary Refill: Capillary refill takes less than 2 seconds.  ?Neurological:  ?   General: No focal deficit present.  ?   Mental Status: He is alert and oriented to person, place, and time.  ?Psychiatric:     ?   Mood and Affect: Mood normal.     ?   Behavior: Behavior normal.  ? ? ? ?ASSESSMENT & PLAN: ?Problem List Items Addressed This Visit   ?None ?Visit Diagnoses   ? ? Encounter for general adult medical examination with abnormal findings    -  Primary  ? History of  melanoma      ? History of prostate cancer      ? Dyslipidemia      ? Relevant Orders  ? Lipid panel  ? Insomnia, unspecified type      ? Relevant Medications  ? zolpidem (AMBIEN) 5 MG tablet  ? Chronic left shoul

## 2021-04-22 ENCOUNTER — Other Ambulatory Visit: Payer: Self-pay | Admitting: Emergency Medicine

## 2021-04-22 DIAGNOSIS — E785 Hyperlipidemia, unspecified: Secondary | ICD-10-CM

## 2021-04-22 NOTE — Progress Notes (Signed)
? ? ?Subjective:   ? ?CC: L shoulder and R thumb pain ? ?I, Wendy Poet, LAT, ATC, am serving as scribe for Dr. Lynne Leader. ? ?HPI: Pt is a 71 y/o male presenting w/ L shoulder and R thumb pain. ? ?He is left-hand dominant ? ?L shoulder: Pt reports L shoulder pain ongoing 10-15 months. Pt locates pain to the superior and lateral aspect of the L shoulder. Pt notes no pain unless trying to abd his shoulder above 90. ?-Radiating pain: no ?-Neck pain: no ?-Mechanical symptoms: no ?-Aggravating factors: above 90 ?-Treatments tried: no ? ?R thumb pain: Pt had trigger thumb surgery 20 years ago. Pt locates pain to the R thenar eminence and along the MCP joint. Pt c/o pain waking him up and night w/ aching pain in his thumb. Pt notes prior dx of arthritis in his R thumb. ?-Swelling: yes ?-Grip strength: slightly  ?-Aggravating factors: shaking hands ? ?Diagnostic testing: R thumb XR- 03/12/20, C-spine XR- 03/12/20 ? ?Pertinent review of Systems: No fevers or chills ? ?Relevant historical information: History of prostate cancer ? ? ?Objective:   ? ?Vitals:  ? 04/25/21 0801  ?BP: 130/84  ?Pulse: 68  ?SpO2: 98%  ? ?General: Well Developed, well nourished, and in no acute distress.  ? ?MSK: Left shoulder normal-appearing ?Nontender. ?Range of motion abduction 120 degrees. ?Functional internal rotation to the lower portion of the lumbar spine. ?External rotation is full. ?Strength abduction 4/5.  Intact external and internal rotation. ?Positive Hawkins and Neer's test. ?Negative Yergason's and speeds test. ?Pulses capillary refill and sensation are intact distally. ? ?Right thumb mild swelling at first MCP.  Tender to palpation. ?Normal IP motion.  No triggering present. ? ?Lab and Radiology Results ? ?Procedure: Real-time Ultrasound Guided Injection of left shoulder subacromial bursa ?Device: Philips Affiniti 50G ?Images permanently stored and available for review in PACS ?Ultrasound evaluation prior to injection reveals  mild subacromial bursitis with intact rotator cuff tendons. ?Verbal informed consent obtained.  Discussed risks and benefits of procedure. Warned about infection bleeding damage to structures skin hypopigmentation and fat atrophy among others. ?Patient expresses understanding and agreement ?Time-out conducted.   ?Noted no overlying erythema, induration, or other signs of local infection.   ?Skin prepped in a sterile fashion.   ?Local anesthesia: Topical Ethyl chloride.   ?With sterile technique and under real time ultrasound guidance: 40 mg of Kenalog and 2 mL of Marcaine injected into subacromial bursa. Fluid seen entering the bursa.   ?Completed without difficulty   ?Pain immediately resolved suggesting accurate placement of the medication.   ?Advised to call if fevers/chills, erythema, induration, drainage, or persistent bleeding.   ?Images permanently stored and available for review in the ultrasound unit.  ?Impression: Technically successful ultrasound guided injection. ? ? ?X-ray images left shoulder obtained today personally and independently interpreted ?Left shoulder.  Moderate glenohumeral DJD.  Inferior glenoid spur present.  No acute fractures. ?Await formal radiology review ? ?EXAM: ?RIGHT THUMB 2+V ?  ?COMPARISON:  None. ?  ?FINDINGS: ?Three view radiograph right thumb demonstrates moderate degenerative ?arthritis at the first MCP joint with mild radial subluxation. No ?acute fracture. Soft tissues are unremarkable. ?  ?IMPRESSION: ?Moderate degenerative arthritis of the first MCP joint ?  ?  ?Electronically Signed ?  By: Fidela Salisbury MD ?  On: 03/12/2020 16:30 ?  ?I, Lynne Leader, personally (independently) visualized and performed the interpretation of the images attached in this note. ? ? ? ?Impression and Recommendations:   ? ?  Assessment and Plan: ?71 y.o. male with left shoulder pain thought to be due to subacromial bursitis.  He does have more shoulder glenohumeral DDG than I originally  appreciated so there certainly could be a source of his pain as well.  If this subacromial injection and PT does not help glenohumeral injection is a good next step.. ? ?Plan for PT and check back in 6  weeks. ? ?Right thumb pain at MCP.  Thought to be due to DJD.  Good candidate for hand PT and Voltaren gel.  Referral placed today.  Check back in 6 weeks if not improved consider injection. ? ?PDMP not reviewed this encounter. ?Orders Placed This Encounter  ?Procedures  ? [place] LIMITED JOINT SPACE STRUCTURES UP LEFT(NO LINKED CHARGES)  ?  Order Specific Question:   Reason for Exam (SYMPTOM  OR DIAGNOSIS REQUIRED)  ?  Answer:   L shoulder pain  ?  Order Specific Question:   Preferred imaging location?  ?  Answer:   [place]  ? DG Shoulder Left  ?  Standing Status:   Future  ?  Number of Occurrences:   1  ?  Standing Expiration Date:   05/26/2021  ?  Order Specific Question:   Reason for Exam (SYMPTOM  OR DIAGNOSIS REQUIRED)  ?  Answer:   left shoulder pain  ?  Order Specific Question:   Preferred imaging location?  ?  Answer:   [facility]  ? Ambulatory referral to Physical Therapy  ?  Referral Priority:   Routine  ?  Referral Type:   Physical Medicine  ?  Referral Reason:   Specialty Services Required  ?  Requested Specialty:   Physical Therapy  ?  Number of Visits Requested:   1  ? ?No orders of the defined types were placed in this encounter. ? ? ?Discussed warning signs or symptoms. Please see discharge instructions. Patient expresses understanding. ? ? ?The above documentation has been reviewed and is accurate and complete Lynne Leader, M.D. ? ?

## 2021-04-25 ENCOUNTER — Ambulatory Visit: Payer: Medicare HMO | Admitting: Family Medicine

## 2021-04-25 ENCOUNTER — Other Ambulatory Visit: Payer: Self-pay

## 2021-04-25 ENCOUNTER — Ambulatory Visit (INDEPENDENT_AMBULATORY_CARE_PROVIDER_SITE_OTHER): Payer: Medicare HMO

## 2021-04-25 ENCOUNTER — Ambulatory Visit: Payer: Self-pay

## 2021-04-25 VITALS — BP 130/84 | HR 68 | Ht 71.0 in | Wt 207.6 lb

## 2021-04-25 DIAGNOSIS — M79644 Pain in right finger(s): Secondary | ICD-10-CM

## 2021-04-25 DIAGNOSIS — M25512 Pain in left shoulder: Secondary | ICD-10-CM

## 2021-04-25 DIAGNOSIS — G8929 Other chronic pain: Secondary | ICD-10-CM

## 2021-04-25 DIAGNOSIS — M19012 Primary osteoarthritis, left shoulder: Secondary | ICD-10-CM | POA: Diagnosis not present

## 2021-04-25 NOTE — Patient Instructions (Addendum)
Thank you for coming in today.  ? ?Please get an Xray today before you leave  ? ?You received a steroid injection in your shoulder today. Seek immediate medical attention if the joint becomes red, extremely painful, or is oozing fluid.  ? ?Please use Voltaren gel (Generic Diclofenac Gel) up to 4x daily for pain as needed.  This is available over-the-counter as both the name brand Voltaren gel and the generic diclofenac gel.  ? ?I've referred you to Physical Therapy.  Let us know if you don't hear from them in one week.  ? ?Recheck back in 6 weeks ?

## 2021-04-27 NOTE — Progress Notes (Signed)
Left shoulder x-ray shows some arthritis changes.

## 2021-05-12 ENCOUNTER — Ambulatory Visit (INDEPENDENT_AMBULATORY_CARE_PROVIDER_SITE_OTHER): Payer: Medicare HMO | Admitting: Nurse Practitioner

## 2021-05-12 VITALS — BP 102/64 | HR 65 | Temp 97.8°F | Ht 71.0 in | Wt 200.1 lb

## 2021-05-12 DIAGNOSIS — L600 Ingrowing nail: Secondary | ICD-10-CM | POA: Diagnosis not present

## 2021-05-12 DIAGNOSIS — I499 Cardiac arrhythmia, unspecified: Secondary | ICD-10-CM | POA: Insufficient documentation

## 2021-05-12 MED ORDER — IBUPROFEN 600 MG PO TABS
600.0000 mg | ORAL_TABLET | Freq: Three times a day (TID) | ORAL | 0 refills | Status: DC | PRN
Start: 2021-05-12 — End: 2023-04-30

## 2021-05-12 MED ORDER — MUPIROCIN 2 % EX OINT: TOPICAL_OINTMENT | Freq: Two times a day (BID) | CUTANEOUS | 0 refills | Status: DC

## 2021-05-12 NOTE — Assessment & Plan Note (Signed)
Acute, referral to podiatry made today.  I have prescribed a Pearson ointment that he can apply twice a day for prevention of secondary bacterial infection as well as ibuprofen 600 mg by mouth that he can take every 8 hours as needed for pain.  He was also given the contact information to the podiatrist office. ?

## 2021-05-12 NOTE — Patient Instructions (Addendum)
[facility] ?[phone] ?[address] ?

## 2021-05-12 NOTE — Assessment & Plan Note (Signed)
EKG completed today which showed sinus rhythm with PVCs.  This was disclosed to patient and told him that this is a benign finding, he remains asymptomatic.  No additional recommendation regarding evaluation made today. ?

## 2021-05-12 NOTE — Progress Notes (Signed)
? ? ? ?Subjective:  ?Patient ID: Allen Burnett, male    DOB: [birth year]  Age: 71 y.o. MRN: [id number] ? ?CC:  ?Chief Complaint  ?Patient presents with  ? Ingrown Toenail  ?  Painful, red swollen  ?  ? ? ?HPI  ?This patient arrives today for the above. ? ?Symptoms started about 1 week ago.  He does have a history of ingrown toenail in the past.  His right great toe is painful and swollen.  He would like this to be evaluated today. ? ?Past Medical History:  ?Diagnosis Date  ? Acid reflux OCCASIONALLY--  TAKE PREVACID  ? Atypical nevus 02/20/2006  ? slight to moderate - right paraspinal, lower back  ? Atypical nevus 10/01/2013  ? moderate to severe - upper paraspinal (WS)  ? Atypical nevus 10/01/2013  ? moderate - right upper paraspinal (WS)  ? Atypical nevus 04/27/2016  ? moderate - right upper back  ? Basal cell carcinoma 02/20/2006  ? left medial chest  ? Basal cell carcinoma 11/05/2013  ? right upper paraspinal (recheck in 3 months)  ? Chronic cluster headache FOLLOWED AT [place]  ? PRN OXYGEN VIA [place] WHEN FEELS HEADACHE STARTING/  ON PREVENTION MEDS  ? Halo nevus 03/30/2016  ? moderate - right upper back (WS)  ? Hx of radiation therapy 12/28/11  ? prostate seed implant/75 seeds/,Dr.Dahlstedt  ? Hyperlipidemia   ? Lentigo maligna ([place]) 01/08/1998  ? right angle of jaw - excision  ? Melanoma ([place]) 02/20/2006  ? left lateral chest - excision  ? Melanoma in situ ([place]) 07/27/2009  ? right lobe of ear  ? Melanoma in situ ([place]) 10/01/2013  ? left jawline [facility])  ? Melanoma in situ ([place]) 10/01/2013  ? left cheek [facility])  ? Prostate cancer ([place]) DX  10/11/11  ? Adenocarcinoma,gleason 3+3=6,PSA=5.39   volume=30cc  ? PSA elevation   ? 2009=3.4,2010=3.46,2011=3.16,2012=3.76,07/2011=4.85  ? SCCA (squamous cell carcinoma) of skin 03/03/2020  ? Right Lower Shin (Keratoacanthoma)  ? SCCA (squamous cell carcinoma) of skin 03/03/2020  ? Right Dorsal Hand (in situ)  ? Squamous cell carcinoma of dorsum of right hand  08/18/1997  ? CX3 + 5FU  ? Squamous cell carcinoma of skin 10/26/2015  ? Right thigh, proximal  ? Squamous cell carcinoma, face 10/01/2013  ? Right forehead - CX3 + 5FU  ? Squamous cell carcinoma, face 11/15/2017  ? Right bulb nose - tx p bx  ? ? ? ? ?Family History  ?Problem Relation Age of Onset  ? Cancer Brother 15  ?     colon cancer now age 53  ? Hyperlipidemia Mother   ? Heart disease Maternal Grandfather   ? Cancer Brother   ? ? ?Social History  ? ?Social History Narrative  ? Not on file  ? ?Social History  ? ?Tobacco Use  ? Smoking status: Former  ?  Packs/day: 1.00  ?  Years: 10.00  ?  Pack years: 10.00  ?  Types: Cigarettes  ?  Quit date: 07/24/1984  ?  Years since quitting: 36.8  ? Smokeless tobacco: Former  ?  Types: Chew  ?Substance Use Topics  ? Alcohol use: Yes  ?  Alcohol/week: 6.0 - 8.0 standard drinks  ?  Types: 6 - 8 Standard drinks or equivalent per week  ?  Comment: beer on weekends  ? ? ? ?Current Meds  ?Medication Sig  ? ibuprofen (ADVIL) 600 MG tablet Take 1 tablet (600 mg total) by mouth every 8 (  eight) hours as needed.  ? Multiple Vitamin (MULTIVITAMIN WITH MINERALS) TABS tablet Take 1 tablet by mouth daily.  ? mupirocin ointment (BACTROBAN) 2 % Apply topically 2 (two) times daily.  ? rosuvastatin (CRESTOR) 10 MG tablet TAKE ONE TABLET BY MOUTH DAILY  ? zolpidem (AMBIEN) 5 MG tablet Take 1 tablet (5 mg total) by mouth at bedtime as needed for sleep.  ? ? ?ROS:  ?Review of Systems  ?Respiratory:  Negative for shortness of breath.   ?Cardiovascular:  Negative for chest pain.  ?Musculoskeletal:   ?     (+) toe pain  ? ? ?Objective:  ? ?Today's Vitals: BP 102/64   Pulse 65   Temp 97.8 ?F (36.6 ?C) (Oral)   Ht '5\' 11"'$  (1.803 m)   Wt 200 lb 2 oz (90.8 kg)   SpO2 99%   BMI 27.91 kg/m?  ? ?  05/12/2021  ?  2:26 PM 04/25/2021  ?  8:01 AM 04/19/2021  ?  8:26 AM  ?Vitals with BMI  ?Height '5\' 11"'$  '5\' 11"'$  '5\' 11"'$   ?Weight 200 lbs 2 oz 207 lbs 10 oz 207 lbs  ?BMI 27.92 28.97 28.88  ?Systolic 762 263 335   ?Diastolic 64 84 68  ?Pulse 65 68 60  ?  ? ?Physical Exam ?Vitals reviewed.  ?Constitutional:   ?   Appearance: Normal appearance.  ?HENT:  ?   Head: Normocephalic and atraumatic.  ?Cardiovascular:  ?   Rate and Rhythm: Normal rate. Rhythm irregular.  ?   Pulses:     ?     Dorsalis pedis pulses are 2+ on the right side.  ?Pulmonary:  ?   Effort: Pulmonary effort is normal.  ?   Breath sounds: Normal breath sounds.  ?Musculoskeletal:  ?   Cervical back: Neck supple.  ?Feet:  ?   Right foot:  ?   Skin integrity: Erythema and warmth present.  ?   Toenail Condition: Right toenails are ingrown.  ?Skin: ?   General: Skin is warm and dry.  ?Neurological:  ?   Mental Status: He is alert and oriented to person, place, and time.  ?Psychiatric:     ?   Mood and Affect: Mood normal.     ?   Behavior: Behavior normal.     ?   Thought Content: Thought content normal.     ?   Judgment: Judgment normal.  ? ? ? ? ? ? ? ?Assessment and Plan  ? ?1. Ingrown toenail   ?2. Irregular heart beat   ? ? ? ?Plan: ?See plan via problem list below. ? ? ?Tests ordered ?Orders Placed This Encounter  ?Procedures  ? Ambulatory referral to Podiatry  ? EKG 12-Lead  ? ? ? ? ?Meds ordered this encounter  ?Medications  ? mupirocin ointment (BACTROBAN) 2 %  ?  Sig: Apply topically 2 (two) times daily.  ?  Dispense:  22 g  ?  Refill:  0  ?  Order Specific Question:   Supervising Provider  ?  Answer:   Binnie Rail [[id number]]  ? ibuprofen (ADVIL) 600 MG tablet  ?  Sig: Take 1 tablet (600 mg total) by mouth every 8 (eight) hours as needed.  ?  Dispense:  30 tablet  ?  Refill:  0  ?  Order Specific Question:   Supervising Provider  ?  Answer:   Binnie Rail [[id number]]  ? ? ?Patient to follow-up if symptoms worsen or do not improve. ? ?  Ailene Ards, NP ? ?

## 2021-05-17 ENCOUNTER — Ambulatory Visit: Payer: Medicare HMO | Admitting: Podiatry

## 2021-05-30 ENCOUNTER — Encounter: Payer: Self-pay | Admitting: Podiatry

## 2021-05-30 ENCOUNTER — Ambulatory Visit: Payer: Medicare HMO | Admitting: Podiatry

## 2021-05-30 DIAGNOSIS — L600 Ingrowing nail: Secondary | ICD-10-CM

## 2021-05-30 NOTE — Progress Notes (Signed)
?  Subjective:  ?Patient ID: Allen Burnett, male    DOB: 25-Feb-1950,  MRN: [id number] ? ?Chief Complaint  ?Patient presents with  ? Ingrown Toenail  ?  np right great toenail possibly ingrown  ? ? ?71 y.o. male presents with the above complaint. History confirmed with patient.  He has been this way for a few weeks now he tried to get it out himself at home but feels like it may have made it worse ? ?Objective:  ?Physical Exam: ?warm, good capillary refill, no trophic changes or ulcerative lesions, normal DP and PT pulses, and normal sensory exam. ? ?Right Foot: Ingrown nail no paronychia medial border right hallux ? ?Assessment:  ? ?1. Ingrowing right great toenail   ? ? ? ?Plan:  ?Patient was evaluated and treated and all questions answered. ? ? ? ?Ingrown Nail, right ?-Patient elects to proceed with minor surgery to remove ingrown toenail today. Consent reviewed and signed by patient. ?-Ingrown nail excised. See procedure note. ?-Educated on post-procedure care including soaking. Written instructions provided and reviewed. ? ? ?Procedure: Excision of Ingrown Toenail ?Location: Right 1st toe medial nail borders. ?Anesthesia: Lidocaine 1% plain; 1.5 mL and Marcaine 0.5% plain; 1.5 mL, digital block. ?Skin Prep: Betadine. ?Dressing: Silvadene; telfa; dry, sterile, compression dressing. ?Technique: Following skin prep, the toe was exsanguinated and a tourniquet was secured at the base of the toe. The affected nail border was freed, split with a nail splitter, and excised. The tourniquet was then removed and sterile dressing applied. ?Disposition: Patient tolerated procedure well. Patient to return as needed if it recurs ? ? ?Return if symptoms worsen or fail to improve, for right nail check.  ? ?

## 2021-05-30 NOTE — Patient Instructions (Signed)

## 2021-06-03 ENCOUNTER — Ambulatory Visit: Payer: Medicare HMO | Admitting: Podiatry

## 2021-06-06 ENCOUNTER — Ambulatory Visit: Payer: Medicare HMO | Admitting: Family Medicine

## 2021-06-10 ENCOUNTER — Telehealth: Payer: Self-pay | Admitting: Gastroenterology

## 2021-06-10 NOTE — Telephone Encounter (Signed)
Hi Dr. Havery Moros, ? ?D.O.D. (04/19/21-AM) ? ?We received a referral/records for colonoscopy. Patient has history with [facility]. Last colonoscopy: 01/24/17. I will send records up for review. Please advise on scheduling.  ? ?Thank you.  ?

## 2021-06-15 ENCOUNTER — Other Ambulatory Visit: Payer: Self-pay | Admitting: Emergency Medicine

## 2021-06-15 DIAGNOSIS — G4700 Insomnia, unspecified: Secondary | ICD-10-CM

## 2021-06-16 ENCOUNTER — Encounter: Payer: Self-pay | Admitting: Medical

## 2021-06-16 NOTE — Telephone Encounter (Signed)
Patient is requesting a refill of the following medications: ?Requested Prescriptions  ? ?Pending Prescriptions Disp Refills  ? zolpidem (AMBIEN) 5 MG tablet [Pharmacy Med Name: ZOLPIDEM TARTRATE 5 MG TABLET] 15 tablet   ?  Sig: TAKE ONE TABLET BY MOUTH EVERY NIGHT AT BEDTIME AS NEEDED FOR SLEEP  ? ? ?Date of patient request: 06/15/2021 ?Last office visit: 04/19/2021 ?Date of last refill: 05/10/2021 ?Last refill amount: 15 tabs ?Follow up time period per chart: no f/u appt on file  ?

## 2021-06-17 ENCOUNTER — Encounter: Payer: Self-pay | Admitting: Gastroenterology

## 2021-06-28 ENCOUNTER — Telehealth: Payer: Self-pay | Admitting: Emergency Medicine

## 2021-06-28 NOTE — Telephone Encounter (Signed)
LVM for pt to rtn my call to schedule AWV with [facility]. Call back # [phone] 

## 2021-07-01 ENCOUNTER — Ambulatory Visit (INDEPENDENT_AMBULATORY_CARE_PROVIDER_SITE_OTHER): Payer: Medicare HMO | Admitting: *Deleted

## 2021-07-01 ENCOUNTER — Encounter: Payer: Self-pay | Admitting: *Deleted

## 2021-07-01 VITALS — Ht 71.0 in

## 2021-07-01 DIAGNOSIS — Z0000 Encounter for general adult medical examination without abnormal findings: Secondary | ICD-10-CM | POA: Diagnosis not present

## 2021-07-01 NOTE — Progress Notes (Signed)
Subjective:   Allen Burnett is a 71 y.o. male who presents for Medicare Annual/Subsequent preventive examination. I connected with  Allen Burnett on 07/01/21 by a audio enabled telemedicine application and verified that I am speaking with the correct person using two identifiers.  Patient Location: Home  Provider Location: Office/Clinic  I discussed the limitations of evaluation and management by telemedicine. The patient expressed understanding and agreed to proceed.   Review of Systems    Defer to PCP       Objective:    Today's Vitals   07/01/21 1049  Height: '5\' 11"'$  (1.803 m)   Body mass index is 27.91 kg/m.     03/15/2016    8:15 AM 12/28/2011    7:18 AM  Advanced Directives  Does Patient Have a Medical Advance Directive? No Patient would not like information  Pre-existing out of facility DNR order (yellow form or pink MOST form)  No    Current Medications (verified) Outpatient Encounter Medications as of 07/01/2021  Medication Sig   Multiple Vitamin (MULTIVITAMIN WITH MINERALS) TABS tablet Take 1 tablet by mouth daily.   rosuvastatin (CRESTOR) 10 MG tablet TAKE ONE TABLET BY MOUTH DAILY   zolpidem (AMBIEN) 5 MG tablet TAKE ONE TABLET BY MOUTH EVERY NIGHT AT BEDTIME AS NEEDED FOR SLEEP   ibuprofen (ADVIL) 600 MG tablet Take 1 tablet (600 mg total) by mouth every 8 (eight) hours as needed. (Patient not taking: Reported on 07/01/2021)   mupirocin ointment (BACTROBAN) 2 % Apply topically 2 (two) times daily. (Patient not taking: Reported on 07/01/2021)   No facility-administered encounter medications on file as of 07/01/2021.    Allergies (verified) Patient has no known allergies.   History: Past Medical History:  Diagnosis Date   Acid reflux OCCASIONALLY--  TAKE PREVACID   Atypical nevus 02/20/2006   slight to moderate - right paraspinal, lower back   Atypical nevus 10/01/2013   moderate to severe - upper paraspinal (WS)   Atypical nevus 10/01/2013   moderate -  right upper paraspinal (WS)   Atypical nevus 04/27/2016   moderate - right upper back   Basal cell carcinoma 02/20/2006   left medial chest   Basal cell carcinoma 11/05/2013   right upper paraspinal (recheck in 3 months)   Chronic cluster headache FOLLOWED AT [place]   PRN OXYGEN VIA [place] WHEN FEELS HEADACHE STARTING/  ON PREVENTION MEDS   Halo nevus 03/30/2016   moderate - right upper back (WS)   Hx of radiation therapy 12/28/11   prostate seed implant/75 seeds/,Dr.Dahlstedt   Hyperlipidemia    Lentigo maligna ([place]) 01/08/1998   right angle of jaw - excision   Melanoma ([place]) 02/20/2006   left lateral chest - excision   Melanoma in situ ([place]) 07/27/2009   right lobe of ear   Melanoma in situ ([place]) 10/01/2013   left jawline (MOHs)   Melanoma in situ ([place]) 10/01/2013   left cheek (MOHs)   Prostate cancer ([place]) DX  10/11/11   Adenocarcinoma,gleason 3+3=6,PSA=5.39   volume=30cc   PSA elevation    2009=3.4,2010=3.46,2011=3.16,2012=3.76,07/2011=4.85   SCCA (squamous cell carcinoma) of skin 03/03/2020   Right Lower Shin (Keratoacanthoma)   SCCA (squamous cell carcinoma) of skin 03/03/2020   Right Dorsal Hand (in situ)   Squamous cell carcinoma of dorsum of right hand 08/18/1997   CX3 + 5FU   Squamous cell carcinoma of skin 10/26/2015   Right thigh, proximal   Squamous cell carcinoma, face 10/01/2013   Right forehead -  CX3 + 5FU   Squamous cell carcinoma, face 11/15/2017   Right bulb nose - tx p bx   Past Surgical History:  Procedure Laterality Date   APPENDECTOMY  AGE 79   PROSTATE BIOPSY  10/11/11   gleason 3+3=6,PSA=5.39,Adenocarcinoma   PROSTATE SURGERY     RADIOACTIVE SEED IMPLANT  12/28/2011   Procedure: RADIOACTIVE SEED IMPLANT;  Surgeon: Franchot Gallo, MD;  [facility];  Service: Urology;  Laterality: N/A;  63 seeds implanted  no seeds found in bladder   Family History  Problem Relation Age of Onset   Cancer Brother 45        colon cancer now age 73   Hyperlipidemia Mother    Heart disease Maternal Grandfather    Cancer Brother    Social History   Socioeconomic History   Marital status: Married    Spouse name: Not on file   Number of children: 2   Years of education: Not on file   Highest education level: Not on file  Occupational History    Comment: advertising sales/ retired   Occupation: golf [employer]    Comment: Part time  Tobacco Use   Smoking status: Former    Packs/day: 1.00    Years: 10.00    Pack years: 10.00    Types: Cigarettes    Quit date: 07/24/1984    Years since quitting: 36.9   Smokeless tobacco: Former    Types: Chew  Substance and Sexual Activity   Alcohol use: Yes    Alcohol/week: 6.0 - 8.0 standard drinks    Types: 6 - 8 Standard drinks or equivalent per week    Comment: beer on weekends   Drug use: No   Sexual activity: Yes    Partners: Female  Other Topics Concern   Not on file  Social History Narrative   Not on file   Social Determinants of Health   Financial Resource Strain: Low Risk    Difficulty of Paying Living Expenses: Not hard at all  Food Insecurity: No Food Insecurity   Worried About Charity fundraiser in the Last Year: Never true   [employer] in the Last Year: Never true  Transportation Needs: No Transportation Needs   Lack of Transportation (Medical): No   Lack of Transportation (Non-Medical): No  Physical Activity: Insufficiently Active   Days of Exercise per Week: 3 days   Minutes of Exercise per Session: 40 min  Stress: No Stress Concern Present   Feeling of Stress : Not at all  Social Connections: Moderately Integrated   Frequency of Communication with Friends and Family: More than three times a week   Frequency of Social Gatherings with Friends and Family: More than three times a week   Attends Religious Services: 1 to 4 times per year   Active Member of [employer] or Organizations: No   Attends Music therapist: Never    Marital Status: Married    Tobacco Counseling Counseling given: Not Answered   Clinical Intake:  Pre-visit preparation completed: No  Pain : No/denies pain     Nutritional Risks: None Diabetes: No  How often do you need to have someone help you when you read instructions, pamphlets, or other written materials from your doctor or pharmacy?: 1 - Never What is the last grade level you completed in school?: 4 years of college  Diabetic?no   Interpreter Needed?: No      Activities of Daily Living  07/01/2021   10:55 AM  In your present state of health, do you have any difficulty performing the following activities:  Hearing? 0  Vision? 0  Difficulty concentrating or making decisions? 0  Walking or climbing stairs? 0  Dressing or bathing? 0  Doing errands, shopping? 0    Patient Care Team: Horald Pollen, MD as PCP - General (Internal Medicine) Warren Danes, PA-C as Physician Assistant (Dermatology)  Indicate any recent Medical Services you may have received from other than [facility] providers in the past year (date may be approximate).     Assessment:   This is a routine wellness examination for Allen Burnett.  Hearing/Vision screen No results found.  Dietary issues and exercise activities discussed: Current Exercise Habits: Structured exercise class, Type of exercise: strength training/weights, Time (Minutes): 40, Frequency (Times/Week): 3, Weekly Exercise (Minutes/Week): 120, Intensity: Moderate, Exercise limited by: None identified   Goals Addressed             This Visit's Progress    CCM Expected Outcome:   On track    To continue to play and enjoy golf      Depression Screen    07/01/2021   10:52 AM 07/01/2021   10:42 AM 05/12/2021    2:28 PM 04/19/2021    9:46 AM 04/14/2020    9:56 AM 11/07/2019    9:12 AM 04/11/2019    8:09 AM  PHQ 2/9 Scores  PHQ - 2 Score 0 0 0 0 0 0 1    Fall Risk    07/01/2021   10:42 AM 05/12/2021    2:28 PM  04/19/2021    9:45 AM 04/14/2020    9:56 AM 11/07/2019    9:12 AM  Fall Risk   Falls in the past year? 0 0 0 0 0  Number falls in past yr:   0  0  Injury with Fall?   0  0  Follow up    Falls evaluation completed Falls evaluation completed    FALL RISK PREVENTION PERTAINING TO THE HOME:  Any stairs in or around the home? No  If so, are there any without handrails? No  Home free of loose throw rugs in walkways, pet beds, electrical cords, etc? No  Adequate lighting in your home to reduce risk of falls? Yes   ASSISTIVE DEVICES UTILIZED TO PREVENT FALLS:  Life alert? No  Use of a cane, walker or w/c? No  Grab bars in the bathroom? No  Shower chair or bench in shower? Yes  Elevated toilet seat or a handicapped toilet? Yes   TIMED UP AND GO:  Was the test performed? No .  Length of time to ambulate 10 feet: n/a sec.   Cognitive Function:        07/01/2021   10:54 AM 04/09/2018    8:33 AM  6CIT Screen  What Year? 0 points 0 points  What month? 0 points 0 points  What time? 0 points 0 points  Count back from 20 0 points 0 points  Months in reverse 0 points 0 points  Repeat phrase 0 points 0 points  Total Score 0 points 0 points    Immunizations Immunization History  Administered Date(s) Administered   Influenza, High Dose Seasonal PF 10/20/2018   Influenza-Unspecified 12/06/2015, 12/13/2016   Pneumococcal Conjugate-13 10/29/2013   Pneumococcal Polysaccharide-23 03/15/2016    TDAP status: Up to date  Flu Vaccine status: Up to date  Pneumococcal vaccine status: Due, Education has been provided  regarding the importance of this vaccine. Advised may receive this vaccine at local pharmacy or Health Dept. Aware to provide a copy of the vaccination record if obtained from local pharmacy or Health Dept. Verbalized acceptance and understanding.  Covid-19 vaccine status: Information provided on how to obtain vaccines.   Qualifies for Shingles Vaccine? Yes   Zostavax completed  No   Shingrix Completed?: No.    Education has been provided regarding the importance of this vaccine. Patient has been advised to call insurance company to determine out of pocket expense if they have not yet received this vaccine. Advised may also receive vaccine at local pharmacy or Health Dept. Verbalized acceptance and understanding.  Screening Tests Health Maintenance  Topic Date Due   COVID-19 Vaccine (1) Never done   Pneumonia Vaccine 46+ Years old (34) 03/15/2021   Zoster Vaccines- Shingrix (1 of 2) 07/20/2021 (Originally 03/29/1969)   INFLUENZA VACCINE  09/06/2021   TETANUS/TDAP  07/08/2022   COLONOSCOPY (Pts 45-38yr Insurance coverage will need to be confirmed)  01/25/2027   Hepatitis C Screening  Completed   HPV VACCINES  Aged Out    Health Maintenance  Health Maintenance Due  Topic Date Due   COVID-19 Vaccine (1) Never done   Pneumonia Vaccine 70 Years old (339 03/15/2021    Colorectal cancer screening: Type of screening: Colonoscopy. Completed 01/24/2017. Repeat every 10 years  Lung Cancer Screening: (Low Dose CT Chest recommended if Age 70-80years, 30 pack-year currently smoking OR have quit w/in 15years.) does not qualify.   Lung Cancer Screening Referral: n/a  Additional Screening:  Hepatitis C Screening: does qualify; Completed 01/20/2015  Vision Screening: Recommended annual ophthalmology exams for early detection of glaucoma and other disorders of the eye. Is the patient up to date with their annual eye exam?  Yes  Who is the provider or what is the name of the office in which the patient attends annual eye exams? [facility] If pt is not established with a provider, would they like to be referred to a provider to establish care? No .   Dental Screening: Recommended annual dental exams for proper oral hygiene  Community Resource Referral / Chronic Care Management: CRR required this visit?  No   CCM required this visit?  No      Plan:     I  have personally reviewed and noted the following in the patient's chart:   Medical and social history Use of alcohol, tobacco or illicit drugs  Current medications and supplements including opioid prescriptions. Patient is not currently taking opioid prescriptions. Functional ability and status Nutritional status Physical activity Advanced directives List of other physicians Hospitalizations, surgeries, and ER visits in previous 12 months Vitals Screenings to include cognitive, depression, and falls Referrals and appointments  In addition, I have reviewed and discussed with patient certain preventive protocols, quality metrics, and best practice recommendations. A written personalized care plan for preventive services as well as general preventive health recommendations were provided to patient.     MMylinda Latina [place]  07/01/2021  non- face-to-face 30 minutes   Nurse Notes:  Mr. JMelott, Thank you for taking time to come for your Medicare Wellness Visit. I appreciate your ongoing commitment to your health goals. Please review the following plan we discussed and let me know if I can assist you in the future.   These are the goals we discussed:  Goals      CCM Expected Outcome:     To  continue to play and enjoy golf        This is a list of the screening recommended for you and due dates:  Health Maintenance  Topic Date Due   COVID-19 Vaccine (1) Never done   Pneumonia Vaccine (3) 03/15/2021   Zoster (Shingles) Vaccine (1 of 2) 07/20/2021*   Flu Shot  09/06/2021   Tetanus Vaccine  07/08/2022   Colon Cancer Screening  01/25/2027   Hepatitis C Screening: USPSTF Recommendation to screen - Ages 18-79 yo.  Completed   HPV Vaccine  Aged Out  *Topic was postponed. The date shown is not the original due date.

## 2021-07-01 NOTE — Patient Instructions (Signed)
Health Maintenance, Male Adopting a healthy lifestyle and getting preventive care are important in promoting health and wellness. Ask your health care provider about: The right schedule for you to have regular tests and exams. Things you can do on your own to prevent diseases and keep yourself healthy. What should I know about diet, weight, and exercise? Eat a healthy diet  Eat a diet that includes plenty of vegetables, fruits, low-fat dairy products, and lean protein. Do not eat a lot of foods that are high in solid fats, added sugars, or sodium. Maintain a healthy weight Body mass index (BMI) is a measurement that can be used to identify possible weight problems. It estimates body fat based on height and weight. Your health care provider can help determine your BMI and help you achieve or maintain a healthy weight. Get regular exercise Get regular exercise. This is one of the most important things you can do for your health. Most adults should: Exercise for at least 150 minutes each week. The exercise should increase your heart rate and make you sweat (moderate-intensity exercise). Do strengthening exercises at least twice a week. This is in addition to the moderate-intensity exercise. Spend less time sitting. Even light physical activity can be beneficial. Watch cholesterol and blood lipids Have your blood tested for lipids and cholesterol at 70 years of age, then have this test every 5 years. You may need to have your cholesterol levels checked more often if: Your lipid or cholesterol levels are high. You are older than 70 years of age. You are at high risk for heart disease. What should I know about cancer screening? Many types of cancers can be detected early and may often be prevented. Depending on your health history and family history, you may need to have cancer screening at various ages. This may include screening for: Colorectal cancer. Prostate cancer. Skin cancer. Lung  cancer. What should I know about heart disease, diabetes, and high blood pressure? Blood pressure and heart disease High blood pressure causes heart disease and increases the risk of stroke. This is more likely to develop in people who have high blood pressure readings or are overweight. Talk with your health care provider about your target blood pressure readings. Have your blood pressure checked: Every 3-5 years if you are 18-39 years of age. Every year if you are 40 years old or older. If you are between the ages of 65 and 75 and are a current or former smoker, ask your health care provider if you should have a one-time screening for abdominal aortic aneurysm (AAA). Diabetes Have regular diabetes screenings. This checks your fasting blood sugar level. Have the screening done: Once every three years after age 45 if you are at a normal weight and have a low risk for diabetes. More often and at a younger age if you are overweight or have a high risk for diabetes. What should I know about preventing infection? Hepatitis B If you have a higher risk for hepatitis B, you should be screened for this virus. Talk with your health care provider to find out if you are at risk for hepatitis B infection. Hepatitis C Blood testing is recommended for: Everyone born from 1945 through 1965. Anyone with known risk factors for hepatitis C. Sexually transmitted infections (STIs) You should be screened each year for STIs, including gonorrhea and chlamydia, if: You are sexually active and are younger than 70 years of age. You are older than 70 years of age and your   health care provider tells you that you are at risk for this type of infection. Your sexual activity has changed since you were last screened, and you are at increased risk for chlamydia or gonorrhea. Ask your health care provider if you are at risk. Ask your health care provider about whether you are at high risk for HIV. Your health care provider  may recommend a prescription medicine to help prevent HIV infection. If you choose to take medicine to prevent HIV, you should first get tested for HIV. You should then be tested every 3 months for as long as you are taking the medicine. Follow these instructions at home: Alcohol use Do not drink alcohol if your health care provider tells you not to drink. If you drink alcohol: Limit how much you have to 0-2 drinks a day. Know how much alcohol is in your drink. In the [place] one drink equals one 12 oz bottle of beer (355 mL), one 5 oz glass of wine (148 mL), or one 1 oz glass of hard liquor (44 mL). Lifestyle Do not use any products that contain nicotine or tobacco. These products include cigarettes, chewing tobacco, and vaping devices, such as e-cigarettes. If you need help quitting, ask your health care provider. Do not use street drugs. Do not share needles. Ask your health care provider for help if you need support or information about quitting drugs. General instructions Schedule regular health, dental, and eye exams. Stay current with your vaccines. Tell your health care provider if: You often feel depressed. You have ever been abused or do not feel safe at home. Summary Adopting a healthy lifestyle and getting preventive care are important in promoting health and wellness. Follow your health care provider's instructions about healthy diet, exercising, and getting tested or screened for diseases. Follow your health care provider's instructions on monitoring your cholesterol and blood pressure. This information is not intended to replace advice given to you by your health care provider. Make sure you discuss any questions you have with your health care provider. Document Revised: 06/14/2020 Document Reviewed: 06/14/2020 Elsevier Patient Education  2023 [employer] Inc[employer]  

## 2021-08-01 ENCOUNTER — Ambulatory Visit (AMBULATORY_SURGERY_CENTER): Payer: Self-pay | Admitting: *Deleted

## 2021-08-01 VITALS — Ht 71.0 in | Wt 201.0 lb

## 2021-08-01 DIAGNOSIS — Z800 Family history of malignant neoplasm of digestive organs: Secondary | ICD-10-CM

## 2021-08-01 MED ORDER — PLENVU 140 G PO SOLR: 1.0000 | ORAL | 0 refills | Status: DC

## 2021-08-04 ENCOUNTER — Other Ambulatory Visit: Payer: Self-pay | Admitting: Emergency Medicine

## 2021-08-04 DIAGNOSIS — G4700 Insomnia, unspecified: Secondary | ICD-10-CM

## 2021-08-04 NOTE — Telephone Encounter (Signed)
Patient is requesting a refill of the following medications: Requested Prescriptions   Pending Prescriptions Disp Refills   zolpidem (AMBIEN) 5 MG tablet [Pharmacy Med Name: ZOLPIDEM TARTRATE 5 MG TABLET] 15 tablet     Sig: TAKE ONE TABLET BY MOUTH EVERY NIGHT AT BEDTIME AS NEEDED FOR SLEEP    Date of patient request: 08/04/2021 Last office visit: 04/19/2021 Date of last refill: 07/20/2021 Last refill amount: 15 tabs  Follow up time period per chart: no f/u appt on file

## 2021-08-24 ENCOUNTER — Encounter: Payer: Self-pay | Admitting: Gastroenterology

## 2021-08-29 ENCOUNTER — Encounter: Payer: Self-pay | Admitting: Gastroenterology

## 2021-08-29 ENCOUNTER — Ambulatory Visit (AMBULATORY_SURGERY_CENTER): Payer: Medicare HMO | Admitting: Gastroenterology

## 2021-08-29 VITALS — BP 130/82 | HR 62 | Temp 97.5°F | Resp 18 | Ht 71.0 in | Wt 201.0 lb

## 2021-08-29 DIAGNOSIS — D120 Benign neoplasm of cecum: Secondary | ICD-10-CM | POA: Diagnosis not present

## 2021-08-29 DIAGNOSIS — D125 Benign neoplasm of sigmoid colon: Secondary | ICD-10-CM | POA: Diagnosis not present

## 2021-08-29 DIAGNOSIS — D124 Benign neoplasm of descending colon: Secondary | ICD-10-CM | POA: Diagnosis not present

## 2021-08-29 DIAGNOSIS — D123 Benign neoplasm of transverse colon: Secondary | ICD-10-CM | POA: Diagnosis not present

## 2021-08-29 DIAGNOSIS — D128 Benign neoplasm of rectum: Secondary | ICD-10-CM | POA: Diagnosis not present

## 2021-08-29 DIAGNOSIS — D122 Benign neoplasm of ascending colon: Secondary | ICD-10-CM | POA: Diagnosis not present

## 2021-08-29 DIAGNOSIS — Z1211 Encounter for screening for malignant neoplasm of colon: Secondary | ICD-10-CM

## 2021-08-29 DIAGNOSIS — D127 Benign neoplasm of rectosigmoid junction: Secondary | ICD-10-CM | POA: Diagnosis not present

## 2021-08-29 DIAGNOSIS — Z800 Family history of malignant neoplasm of digestive organs: Secondary | ICD-10-CM | POA: Diagnosis not present

## 2021-08-29 MED ORDER — SODIUM CHLORIDE 0.9 % IV SOLN: 500.0000 mL | INTRAVENOUS | Status: DC

## 2021-08-29 NOTE — Progress Notes (Signed)
Pt's states no medical or surgical changes since previsit or office visit. 

## 2021-08-29 NOTE — Progress Notes (Signed)
[facility] History and Physical   Primary Care Physician:  Horald Pollen, MD   Reason for Procedure:   Family history of colon cancer  Plan:    colonoscopy     HPI: Allen Burnett is a 71 y.o. male  here for colonoscopy - brother had colon cancer dx at age 12s. Patient's last exam was in 2018 with [facility] and normal. Patient denies any bowel symptoms at this time. Otherwise feels well without any cardiopulmonary symptoms. I have discussed risks / benefits of colonoscopy and anesthesia with him and he wishes to proceed.   Past Medical History:  Diagnosis Date   Acid reflux OCCASIONALLY--  TAKE PREVACID   Atypical nevus 02/20/2006   slight to moderate - right paraspinal, lower back   Atypical nevus 10/01/2013   moderate to severe - upper paraspinal (WS)   Atypical nevus 10/01/2013   moderate - right upper paraspinal (WS)   Atypical nevus 04/27/2016   moderate - right upper back   Basal cell carcinoma 02/20/2006   left medial chest   Basal cell carcinoma 11/05/2013   right upper paraspinal (recheck in 3 months)   Chronic cluster headache FOLLOWED AT [facility]   PRN OXYGEN VIA [place] WHEN FEELS HEADACHE STARTING/  ON PREVENTION MEDS   Halo nevus 03/30/2016   moderate - right upper back (WS)   Hx of radiation therapy 12/28/11   prostate seed implant/75 seeds/,Dr.Dahlstedt   Hyperlipidemia    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   right angle of jaw - excision   Melanoma ([place]) 02/20/2006   left lateral chest - excision   Melanoma in situ ([place]) 07/27/2009   right lobe of ear   Melanoma in situ ([place]) 10/01/2013   left jawline (MOHs)   Melanoma in situ ([place]) 10/01/2013   left cheek (MOHs)   Prostate cancer ([place]) DX  10/11/11   Adenocarcinoma,gleason 3+3=6,PSA=5.39   volume=30cc   PSA elevation    2009=3.4,2010=3.46,2011=3.16,2012=3.76,07/2011=4.85   SCCA (squamous cell carcinoma) of skin 03/03/2020   Right Lower Shin (Keratoacanthoma)   SCCA  (squamous cell carcinoma) of skin 03/03/2020   Right Dorsal Hand (in situ)   Squamous cell carcinoma of dorsum of right hand 08/18/1997   CX3 + 5FU   Squamous cell carcinoma of skin 10/26/2015   Right thigh, proximal   Squamous cell carcinoma, face 10/01/2013   Right forehead - CX3 + 5FU   Squamous cell carcinoma, face 11/15/2017   Right bulb nose - tx p bx    Past Surgical History:  Procedure Laterality Date   APPENDECTOMY  AGE 9   COLONOSCOPY     SEVERAL AT [facility] FOR [facility], NO POLYPS PER PT   PROSTATE BIOPSY  10/11/2011   gleason 3+3=6,PSA=5.39,Adenocarcinoma   PROSTATE SURGERY     RADIOACTIVE SEED IMPLANT  12/28/2011   Procedure: RADIOACTIVE SEED IMPLANT;  Surgeon: Franchot Gallo, MD;  Location: [facility];  Service: Urology;  Laterality: N/A;  75 seeds implanted  no seeds found in bladder    Prior to Admission medications   Medication Sig Start Date End Date Taking? Authorizing Provider  rosuvastatin (CRESTOR) 10 MG tablet TAKE ONE TABLET BY MOUTH DAILY 04/22/21  Yes Sagardia, Ines Bloomer, MD  zolpidem (AMBIEN) 5 MG tablet TAKE ONE TABLET BY MOUTH EVERY NIGHT AT BEDTIME AS NEEDED FOR SLEEP 08/04/21  Yes Sagardia, Ines Bloomer, MD  ibuprofen (ADVIL) 600 MG tablet Take 1 tablet (600 mg total) by mouth every 8 (eight) hours as needed.  05/12/21   Ailene Ards, NP  Multiple Vitamin (MULTIVITAMIN WITH MINERALS) TABS tablet Take 1 tablet by mouth daily.    [provider]  mupirocin ointment (BACTROBAN) 2 % Apply topically 2 (two) times daily. Patient not taking: Reported on 07/01/2021 05/12/21   Ailene Ards, NP    Current Outpatient Medications  Medication Sig Dispense Refill   rosuvastatin (CRESTOR) 10 MG tablet TAKE ONE TABLET BY MOUTH DAILY 90 tablet 3   zolpidem (AMBIEN) 5 MG tablet TAKE ONE TABLET BY MOUTH EVERY NIGHT AT BEDTIME AS NEEDED FOR SLEEP 15 tablet 1   ibuprofen (ADVIL) 600 MG tablet Take 1 tablet (600 mg total) by mouth every 8 (eight) hours  as needed. 30 tablet 0   Multiple Vitamin (MULTIVITAMIN WITH MINERALS) TABS tablet Take 1 tablet by mouth daily.     mupirocin ointment (BACTROBAN) 2 % Apply topically 2 (two) times daily. (Patient not taking: Reported on 07/01/2021) 22 g 0   Current Facility-Administered Medications  Medication Dose Route Frequency Provider Last Rate Last Admin   0.9 %  sodium chloride infusion  500 mL Intravenous Continuous Trinh Sanjose, Carlota Raspberry, MD        Allergies as of 08/29/2021   (No Known Allergies)    Family History  Problem Relation Age of Onset   Hyperlipidemia Mother    Colon polyps Father    Colon cancer Brother 41   Cancer Brother 64       colon cancer now age 65   Heart disease Maternal Grandfather    Esophageal cancer Neg Hx    Rectal cancer Neg Hx    Stomach cancer Neg Hx     Social History   Socioeconomic History   Marital status: Married    Spouse name: Not on file   Number of children: 2   Years of education: Not on file   Highest education level: Not on file  Occupational History    Comment: advertising sales/ retired   Occupation: [employer]    Comment: Part time  Tobacco Use   Smoking status: Former    Packs/day: 1.00    Years: 10.00    Total pack years: 10.00    Types: Cigarettes    Quit date: 07/24/1984    Years since quitting: 37.1   Smokeless tobacco: Former    Types: Chew  Substance and Sexual Activity   Alcohol use: Yes    Alcohol/week: 6.0 - 8.0 standard drinks of alcohol    Types: 6 - 8 Standard drinks or equivalent per week    Comment: beer on weekends   Drug use: No   Sexual activity: Yes    Partners: Female  Other Topics Concern   Not on file  Social History Narrative   Not on file   Social Determinants of Health   Financial Resource Strain: Low Risk  (07/01/2021)   Overall Financial Resource Strain (CARDIA)    Difficulty of Paying Living Expenses: Not hard at all  Food Insecurity: No Food Insecurity (07/01/2021)   Hunger Vital  Sign    Worried About Running Out of Food in the Last Year: Never true    [place] in the Last Year: Never true  Transportation Needs: No Transportation Needs (07/01/2021)   PRAPARE - Hydrologist (Medical): No    Lack of Transportation (Non-Medical): No  Physical Activity: Insufficiently Active (07/01/2021)   Exercise Vital Sign    Days of Exercise  per Week: 3 days    Minutes of Exercise per Session: 40 min  Stress: No Stress Concern Present (07/01/2021)   [place]    Feeling of Stress : Not at all  Social Connections: Moderately Integrated (07/01/2021)   Social Connection and Isolation Panel [NHANES]    Frequency of Communication with Friends and Family: More than three times a week    Frequency of Social Gatherings with Friends and Family: More than three times a week    Attends Religious Services: 1 to 4 times per year    Active Member of [employer] or Organizations: No    Attends Archivist Meetings: Never    Marital Status: Married  Human resources officer Violence: Not At Risk (07/01/2021)   Humiliation, Afraid, Rape, and Kick questionnaire    Fear of Current or Ex-Partner: No    Emotionally Abused: No    Physically Abused: No    Sexually Abused: No    Review of Systems: All other review of systems negative except as mentioned in the HPI.  Physical Exam: Vital signs BP 121/68   Pulse 73   Temp (!) 97.5 F (36.4 C)   Ht '5\' 11"'$  (1.803 m)   Wt 201 lb (91.2 kg)   SpO2 96%   BMI 28.03 kg/m   General:   Alert,  Well-developed, pleasant and cooperative in NAD Lungs:  Clear throughout to auscultation.   Heart:  Regular rate and rhythm Abdomen:  Soft, nontender and nondistended.   Neuro/Psych:  Alert and cooperative. Normal mood and affect. A and O x 3  Jolly Mango, MD [facility]

## 2021-08-29 NOTE — Progress Notes (Signed)
PVC's noted throughout procedure

## 2021-08-29 NOTE — Op Note (Signed)
[place] Patient Name: Allen Burnett Procedure Date: 08/29/2021 7:48 AM MRN: [id number] Endoscopist: Remo Lipps P. Havery Moros , MD Age: 71 Referring MD:  Date of Birth: [birth year] Gender: Male Account #: [id number] Procedure:                Colonoscopy Indications:              Screening in patient at increased risk: Family                            history of 1st-degree relative with colorectal                            cancer before age 65 years Medicines:                Monitored Anesthesia Care Procedure:                Pre-Anesthesia Assessment:                           - Prior to the procedure, a History and Physical                            was performed, and patient medications and                            allergies were reviewed. The patient's tolerance of                            previous anesthesia was also reviewed. The risks                            and benefits of the procedure and the sedation                            options and risks were discussed with the patient.                            All questions were answered, and informed consent                            was obtained. Prior Anticoagulants: The patient has                            taken no previous anticoagulant or antiplatelet                            agents. ASA Grade Assessment: II - A patient with                            mild systemic disease. After reviewing the risks                            and benefits, the patient was deemed in  satisfactory condition to undergo the procedure.                           After obtaining informed consent, the colonoscope                            was passed under direct vision. Throughout the                            procedure, the patient's blood pressure, pulse, and                            oxygen saturations were monitored continuously. The                            Olympus CF-HQ190L (Serial# [id number])  Colonoscope was                            introduced through the anus and advanced to the the                            cecum, identified by appendiceal orifice and                            ileocecal valve. The colonoscopy was performed                            without difficulty. The patient tolerated the                            procedure well. The quality of the bowel                            preparation was adequate. The ileocecal valve,                            appendiceal orifice, and rectum were photographed. Scope In: 8:01:37 AM Scope Out: 8:38:42 AM Scope Withdrawal Time: 0 hours 26 minutes 10 seconds  Total Procedure Duration: 0 hours 37 minutes 5 seconds  Findings:                 The perianal and digital rectal examinations were                            normal.                           A 5 mm polyp was found in the ileocecal valve. The                            polyp was sessile. The polyp was removed with a                            cold snare. Resection and retrieval were complete.  A 10 mm polyp was found in the ascending colon. The                            polyp was flat. The polyp was removed with a cold                            snare. Resection and retrieval were complete.                           Two sessile polyps were found in the transverse                            colon. The polyps were 3 to 5 mm in size. These                            polyps were removed with a cold snare. Resection                            and retrieval were complete.                           Two sessile polyps were found in the splenic                            flexure. The polyps were 2 to 5 mm in size. These                            polyps were removed with a cold snare. Resection                            and retrieval were complete.                           One sessile polyp were found in the descending                             colon. The polyps was 3 mm in size. The polyp were                            removed with a cold snare. Resection and retrieval                            were complete.                           A 3 mm polyp was found in the sigmoid colon. The                            polyp was sessile. The polyp was removed with a                            cold snare. Resection and retrieval were  complete.                           Two sessile polyps were found in the rectum. The                            polyps were 3 to 4 mm in size. These polyps were                            removed with a cold snare. Resection and retrieval                            were complete.                           A few small-mouthed diverticula were found in the                            sigmoid colon.                           Internal hemorrhoids were found during retroflexion.                           The exam was otherwise without abnormality. Complications:            No immediate complications. Estimated blood loss:                            Minimal. Estimated Blood Loss:     Estimated blood loss was minimal. Impression:               - One 5 mm polyp at the ileocecal valve, removed                            with a cold snare. Resected and retrieved.                           - One 10 mm polyp in the ascending colon, removed                            with a cold snare. Resected and retrieved.                           - Two 3 to 5 mm polyps in the transverse colon,                            removed with a cold snare. Resected and retrieved.                           - Two 2 to 5 mm polyps at the splenic flexure,                            removed with a cold snare. Resected and retrieved.                           -  One 3 mm polyp in the descending colon, removed                            with a cold snare. Resected and retrieved.                           - One 3 mm polyp in the sigmoid colon, removed  with                            a cold snare. Resected and retrieved.                           - Two 3 to 4 mm polyps in the rectum, removed with                            a cold snare. Resected and retrieved.                           - Diverticulosis in the sigmoid colon.                           - Internal hemorrhoids.                           - The examination was otherwise normal. Recommendation:           - Patient has a contact number available for                            emergencies. The signs and symptoms of potential                            delayed complications were discussed with the                            patient. Return to normal activities tomorrow.                            Written discharge instructions were provided to the                            patient.                           - Resume previous diet.                           - Continue present medications.                           - Await pathology results. Remo Lipps P. Havery Moros, MD 08/29/2021 8:48:04 AM This report has been signed electronically.

## 2021-08-29 NOTE — Progress Notes (Signed)
Called to room to assist during endoscopic procedure.  Patient ID and intended procedure confirmed with present staff. Received instructions for my participation in the procedure from the performing physician.  

## 2021-08-29 NOTE — Progress Notes (Signed)
Pt non-responsive, VVS, Report to RN  °

## 2021-08-29 NOTE — Patient Instructions (Signed)
Handout on polyp, diverticulosis provided   Await pathology results.   Continue current medications.   .YOU HAD AN ENDOSCOPIC PROCEDURE TODAY AT [facility]:   Refer to the procedure report that was given to you for any specific questions about what was found during the examination.  If the procedure report does not answer your questions, please call your gastroenterologist to clarify.  If you requested that your care partner not be given the details of your procedure findings, then the procedure report has been included in a sealed envelope for you to review at your convenience later.  YOU SHOULD EXPECT: Some feelings of bloating in the abdomen. Passage of more gas than usual.  Walking can help get rid of the air that was put into your GI tract during the procedure and reduce the bloating. If you had a lower endoscopy (such as a colonoscopy or flexible sigmoidoscopy) you may notice spotting of blood in your stool or on the toilet paper. If you underwent a bowel prep for your procedure, you may not have a normal bowel movement for a few days.  Please Note:  You might notice some irritation and congestion in your nose or some drainage.  This is from the oxygen used during your procedure.  There is no need for concern and it should clear up in a day or so.  SYMPTOMS TO REPORT IMMEDIATELY:  Following lower endoscopy (colonoscopy or flexible sigmoidoscopy):  Excessive amounts of blood in the stool  Significant tenderness or worsening of abdominal pains  Swelling of the abdomen that is new, acute  Fever of 100F or higher  For urgent or emergent issues, a gastroenterologist can be reached at any hour by calling [phone]. Do not use MyChart messaging for urgent concerns.    DIET:  We do recommend a small meal at first, but then you may proceed to your regular diet.  Drink plenty of fluids but you should avoid alcoholic beverages for 24 hours.  ACTIVITY:  You should plan to  take it easy for the rest of today and you should NOT DRIVE or use heavy machinery until tomorrow (because of the sedation medicines used during the test).    FOLLOW UP: Our staff will call the number listed on your records the next business day following your procedure.  We will call around 7:15- 8:00 am to check on you and address any questions or concerns that you may have regarding the information given to you following your procedure. If we do not reach you, we will leave a message.  If you develop any symptoms (ie: fever, flu-like symptoms, shortness of breath, cough etc.) before then, please call [phone].  If you test positive for Covid 19 in the 2 weeks post procedure, please call and report this information to [place].    If any biopsies were taken you will be contacted by phone or by letter within the next 1-3 weeks.  Please call us at [phone] if you have not heard about the biopsies in 3 weeks.    SIGNATURES/CONFIDENTIALITY: You and/or your care partner have signed paperwork which will be entered into your electronic medical record.  These signatures attest to the fact that that the information above on your After Visit Summary has been reviewed and is understood.  Full responsibility of the confidentiality of this discharge information lies with you and/or your care-partner.

## 2021-08-30 ENCOUNTER — Telehealth: Payer: Self-pay

## 2021-08-30 NOTE — Telephone Encounter (Signed)
  Follow up Call-     08/29/2021    7:28 AM  Call back number  Post procedure Call Back phone  # [phone]  Permission to leave phone message Yes     Patient questions:  Do you have a fever, pain , or abdominal swelling? No. Pain Score  0 *  Have you tolerated food without any problems? Yes.    Have you been able to return to your normal activities? Yes.    Do you have any questions about your discharge instructions: Diet   No. Medications  No. Follow up visit  No.  Do you have questions or concerns about your Care? No.  Actions: * If pain score is 4 or above: No action needed, pain <4.

## 2021-10-12 ENCOUNTER — Ambulatory Visit (INDEPENDENT_AMBULATORY_CARE_PROVIDER_SITE_OTHER): Payer: Medicare HMO | Admitting: Emergency Medicine

## 2021-10-12 ENCOUNTER — Encounter: Payer: Self-pay | Admitting: Emergency Medicine

## 2021-10-12 VITALS — BP 120/82 | HR 69 | Temp 98.0°F | Ht 71.0 in | Wt 203.4 lb

## 2021-10-12 DIAGNOSIS — M542 Cervicalgia: Secondary | ICD-10-CM

## 2021-10-12 DIAGNOSIS — M62838 Other muscle spasm: Secondary | ICD-10-CM | POA: Insufficient documentation

## 2021-10-12 DIAGNOSIS — Z23 Encounter for immunization: Secondary | ICD-10-CM

## 2021-10-12 MED ORDER — CYCLOBENZAPRINE HCL 10 MG PO TABS: 10.0000 mg | ORAL_TABLET | Freq: Every day | ORAL | 1 refills | Status: DC

## 2021-10-12 MED ORDER — TRAMADOL HCL 50 MG PO TABS: 50.0000 mg | ORAL_TABLET | Freq: Three times a day (TID) | ORAL | 0 refills | Status: AC | PRN

## 2021-10-12 MED ORDER — ZOLPIDEM TARTRATE 10 MG PO TABS: 10.0000 mg | ORAL_TABLET | Freq: Every evening | ORAL | 1 refills | Status: DC | PRN

## 2021-10-12 NOTE — Patient Instructions (Signed)
Cervical Sprain A cervical sprain is also called a neck sprain. It is a stretch or tear in one or more ligaments in the neck. Ligaments are tissues that connect bones to each other. Neck sprains can be mild, bad, or very bad. A very bad sprain in the neck can cause the bones in the neck to be unstable. This can damage the spinal cord. It can also cause serious problems in the brain, spinal cord, and nerves (nervous system). Most neck sprains heal in 4-6 weeks. It can take more or less time depending on: What caused the injury. The amount of injury. What are the causes? Neck sprains may be caused by trauma, such as: An injury from an accident in a vehicle such as a car or boat. A fall. The head and neck being moved front to back or side to side all of a sudden (whiplash injury). Mild neck sprains may be caused by wear and tear over time. What increases the risk? The following factors may make you more likely to develop this condition: Taking part in activities that put you at high risk of hurting your neck. These include: Contact sports. Car racing. Gymnastics. Diving. Taking risks when driving or riding in a vehicle such as a car or boat. Arthritis caused by wear and tear of the joints in the spine. The neck not being very strong or flexible. Having had a neck injury in the past. Poor posture. Spending a lot of time in certain positions that put stress on the neck. This may be from sitting at a computer for a long time. What are the signs or symptoms? Symptoms of this condition include: Your neck, shoulders, or upper back feeling: Painful or sore. Stiff. Tender. Swollen. Hot, or like it is burning. Sudden tightening of neck muscles (spasms). Not being able to move the neck very much. Headache. Feeling dizzy. Feeling like you may vomit, or vomiting. Having a hand or arm that: Feels weak. Loses feeling (feels numb). Tingles. You may get symptoms right away after injury, or you  may get them over a few days. In some cases, symptoms may go away with treatment and come back over time. How is this treated? This condition is treated by: Resting your neck. Icing the part of your neck that is hurt. Doing exercises to restore movement and strength to your neck (physical therapy). If there is no swelling, you may use heat therapy 2-3 days after the injury took place. If your injury is very bad, treatment may also include: Keeping your neck in place for a length of time. This may be done using: A neck collar. This supports your chin and the back of your head. A cervical traction device. This is a sling that holds up your head. The sling removes weight and pressure from your neck. It may also help to relieve pain. Medicines that help with: Pain. Irritation and swelling (inflammation). Medicines that help to relax your muscles (muscle relaxants). Surgery. This is rare. Follow these instructions at home: Medicines  Take over-the-counter and prescription medicines only as told by your doctor. Ask your doctor if the medicine prescribed to you: Requires you to avoid driving or using heavy machinery. Can cause trouble pooping (constipation). You may need to take these actions to prevent or treat trouble pooping: Drink enough fluid to keep your pee (urine) pale yellow. Take over-the-counter or prescription medicines. Eat foods that are high in fiber. These include beans, whole grains, and fresh fruits and vegetables. Limit   foods that are high in fat and sugar. These include fried or sweet foods. If you have a neck collar: Wear it as told by your doctor. Do not take it off unless told. Ask your doctor before adjusting your collar. If you have long hair, keep it outside of the collar. Ask your doctor if you may take off the collar for cleaning and bathing. If you may take off the collar: Follow instructions about how to take it off safely. Clean it by hand with mild soap and  water. Let it air-dry fully. If your collar has pads that you can take out: Take the pads out every 1-2 days. Wash them by hand with soap and water. Let the pads air-dry fully before you put them back in the collar. Tell your doctor if your skin under the collar has irritation or sores. Managing pain, stiffness, and swelling     Use a cervical traction device, if told by your doctor. If told, put ice on the affected area. To do this: Put ice in a plastic bag. Place a towel between your skin and the bag. Leave the ice on for 20 minutes, 2-3 times a day. If told, put heat on the affected area. Do this before exercise or as often as told by your doctor. Use the heat source that your doctor recommends, such as a moist heat pack or a heating pad. Place a towel between your skin and the heat source. Leave the heat on for 20-30 minutes. Take the heat off if your skin turns bright red. This is very important if you cannot feel pain, heat, or cold. You may have a greater risk of getting burned. Activity Do not drive while wearing a neck collar. If you do not have a neck collar, ask if it is safe to drive while your neck heals. Do not lift anything that is heavier than 10 lb (4.5 kg), or the limit that you are told, until your doctor tells you that it is safe. Rest as told by your doctor. Do exercises as told by your doctor or physical therapist. Return to your normal activities as told by your doctor. Avoid positions and activities that make you feel worse. Ask your doctor what activities are safe for you. General instructions Do not use any products that contain nicotine or tobacco, such as cigarettes, e-cigarettes, and chewing tobacco. These can delay healing. If you need help quitting, ask your doctor. Keep all follow-up visits as told by your doctor or physical therapist. This is important. How is this prevented? To prevent a neck sprain from happening again: Practice good posture. Adjust  your workstation to help you do this. Exercise regularly as told by your doctor or physical therapist. Avoid activities that are risky or may cause a neck sprain. Contact a doctor if: Your symptoms get worse. Your symptoms do not get better after 2 weeks of treatment. Your pain gets worse. Medicine does not help your pain. You have new symptoms that you cannot explain. Your neck collar gives you sores on your skin or bothers your skin. Get help right away if: You have very bad pain. You get any of the following in any part of your body: Loss of feeling. Tingling. Weakness. You cannot move a part of your body. You have neck pain and either of these: Very bad dizziness. A very bad headache. Summary A cervical sprain is also called a neck sprain. It is a stretch or tear in one or more   ligaments in the neck. Ligaments are tissues that connect bones. Neck sprains may be caused by trauma, such as an injury or a fall. You may get symptoms right away after injury, or you may get them over a few days. Neck sprains may be treated with rest, heat, ice, medicines, exercise, and surgery. This information is not intended to replace advice given to you by your health care provider. Make sure you discuss any questions you have with your health care provider. Document Revised: 05/02/2021 Document Reviewed: 10/02/2018 Elsevier Patient Education  2023 Elsevier Inc.  

## 2021-10-12 NOTE — Assessment & Plan Note (Signed)
Creating pain and affecting quality of life. Start Flexeril 10 mg at bedtime. Use heat pad several times a day.

## 2021-10-12 NOTE — Assessment & Plan Note (Signed)
No red flag signs or symptoms. Had relief from ibuprofen. Take tramadol 50 mg as needed for moderate to severe pain. Heat pad several times a day.

## 2021-10-12 NOTE — Progress Notes (Signed)
Allen Burnett 71 y.o.   Chief Complaint  Patient presents with   Neck Pain    Neck and shoulder pain x 2 weeks, no getting any better, flare up at night     HISTORY OF PRESENT ILLNESS: This is a 71 y.o. male complaining of neck pain and spasm for the past 2 weeks. Denies injury.  Sharp steady pain with occasional radiation to right shoulder. No other associated symptoms. No other complaints or medical concerns today.  Neck Pain  Pertinent negatives include no chest pain, fever or headaches.    Prior to Admission medications   Medication Sig Start Date End Date Taking? Authorizing Provider  ibuprofen (ADVIL) 600 MG tablet Take 1 tablet (600 mg total) by mouth every 8 (eight) hours as needed. 05/12/21  Yes Ailene Ards, NP  Multiple Vitamin (MULTIVITAMIN WITH MINERALS) TABS tablet Take 1 tablet by mouth daily.   Yes [provider]  rosuvastatin (CRESTOR) 10 MG tablet TAKE ONE TABLET BY MOUTH DAILY 04/22/21  Yes Ester Hilley, Ines Bloomer, MD  zolpidem (AMBIEN) 5 MG tablet TAKE ONE TABLET BY MOUTH EVERY NIGHT AT BEDTIME AS NEEDED FOR SLEEP 08/04/21  Yes Emya Picado, Ines Bloomer, MD  mupirocin ointment (BACTROBAN) 2 % Apply topically 2 (two) times daily. Patient not taking: Reported on 07/01/2021 05/12/21   Ailene Ards, NP    No Known Allergies  Patient Active Problem List   Diagnosis Date Noted   Ingrown toenail 05/12/2021   Irregular heart beat 05/12/2021   Melanoma of skin, site unspecified 10/29/2013   Hyperlipidemia 10/02/2012   Prostate cancer ([place]) 11/08/2011    Past Medical History:  Diagnosis Date   Acid reflux OCCASIONALLY--  TAKE PREVACID   Atypical nevus 02/20/2006   slight to moderate - right paraspinal, lower back   Atypical nevus 10/01/2013   moderate to severe - upper paraspinal (WS)   Atypical nevus 10/01/2013   moderate - right upper paraspinal (WS)   Atypical nevus 04/27/2016   moderate - right upper back   Basal cell carcinoma 02/20/2006   left medial  chest   Basal cell carcinoma 11/05/2013   right upper paraspinal (recheck in 3 months)   Chronic cluster headache FOLLOWED AT [facility]   PRN OXYGEN VIA Honor WHEN FEELS HEADACHE STARTING/  ON PREVENTION MEDS   Halo nevus 03/30/2016   moderate - right upper back (WS)   Hx of radiation therapy 12/28/11   prostate seed implant/75 seeds/,Dr.Dahlstedt   Hyperlipidemia    Lentigo maligna ([place]) 01/08/1998   right angle of jaw - excision   Melanoma ([place]) 02/20/2006   left lateral chest - excision   Melanoma in situ ([place]) 07/27/2009   right lobe of ear   Melanoma in situ ([place]) 10/01/2013   left jawline (MOHs)   Melanoma in situ ([place]) 10/01/2013   left cheek (MOHs)   Prostate cancer ([place]) DX  10/11/11   Adenocarcinoma,gleason 3+3=6,PSA=5.39   volume=30cc   PSA elevation    2009=3.4,2010=3.46,2011=3.16,2012=3.76,07/2011=4.85   SCCA (squamous cell carcinoma) of skin 03/03/2020   Right Lower Shin (Keratoacanthoma)   SCCA (squamous cell carcinoma) of skin 03/03/2020   Right Dorsal Hand (in situ)   Squamous cell carcinoma of dorsum of right hand 08/18/1997   CX3 + 5FU   Squamous cell carcinoma of skin 10/26/2015   Right thigh, proximal   Squamous cell carcinoma, face 10/01/2013   Right forehead - CX3 + 5FU   Squamous cell carcinoma, face 11/15/2017   Right bulb nose -  tx p bx    Past Surgical History:  Procedure Laterality Date   APPENDECTOMY  AGE 74   COLONOSCOPY     SEVERAL AT [facility] FOR [facility], NO POLYPS PER PT   PROSTATE BIOPSY  10/11/2011   gleason 3+3=6,PSA=5.39,Adenocarcinoma   PROSTATE SURGERY     RADIOACTIVE SEED IMPLANT  12/28/2011   Procedure: RADIOACTIVE SEED IMPLANT;  Surgeon: Franchot Gallo, MD;  Location: [facility];  Service: Urology;  Laterality: N/A;  75 seeds implanted  no seeds found in bladder    Social History   Socioeconomic History   Marital status: Married    Spouse name: Not on file   Number of children: 2   Years of  education: Not on file   Highest education level: Not on file  Occupational History    Comment: advertising sales/ retired   Occupation: golf [employer]    Comment: Part time  Tobacco Use   Smoking status: Former    Packs/day: 1.00    Years: 10.00    Total pack years: 10.00    Types: Cigarettes    Quit date: 07/24/1984    Years since quitting: 37.2   Smokeless tobacco: Former    Types: Chew  Substance and Sexual Activity   Alcohol use: Yes    Alcohol/week: 6.0 - 8.0 standard drinks of alcohol    Types: 6 - 8 Standard drinks or equivalent per week    Comment: beer on weekends   Drug use: No   Sexual activity: Yes    Partners: Female  Other Topics Concern   Not on file  Social History Narrative   Not on file   Social Determinants of Health   Financial Resource Strain: Low Risk  (07/01/2021)   Overall Financial Resource Strain (CARDIA)    Difficulty of Paying Living Expenses: Not hard at all  Food Insecurity: No Food Insecurity (07/01/2021)   Hunger Vital Sign    Worried About Running Out of Food in the Last Year: Never true    [place] in the Last Year: Never true  Transportation Needs: No Transportation Needs (07/01/2021)   PRAPARE - Hydrologist (Medical): No    Lack of Transportation (Non-Medical): No  Physical Activity: Insufficiently Active (07/01/2021)   Exercise Vital Sign    Days of Exercise per Week: 3 days    Minutes of Exercise per Session: 40 min  Stress: No Stress Concern Present (07/01/2021)   [place]    Feeling of Stress : Not at all  Social Connections: Moderately Integrated (07/01/2021)   Social Connection and Isolation Panel [NHANES]    Frequency of Communication with Friends and Family: More than three times a week    Frequency of Social Gatherings with Friends and Family: More than three times a week    Attends Religious Services: 1 to 4 times per  year    Active Member of [employer] or Organizations: No    Attends Archivist Meetings: Never    Marital Status: Married  Human resources officer Violence: Not At Risk (07/01/2021)   Humiliation, Afraid, Rape, and Kick questionnaire    Fear of Current or Ex-Partner: No    Emotionally Abused: No    Physically Abused: No    Sexually Abused: No    Family History  Problem Relation Age of Onset   Hyperlipidemia Mother    Colon polyps Father    Colon  cancer Brother 54   Cancer Brother 23       colon cancer now age 85   Heart disease Maternal Grandfather    Esophageal cancer Neg Hx    Rectal cancer Neg Hx    Stomach cancer Neg Hx      Review of Systems  Constitutional: Negative.  Negative for chills and fever.  HENT: Negative.  Negative for congestion and sore throat.   Respiratory: Negative.  Negative for cough and shortness of breath.   Cardiovascular: Negative.  Negative for chest pain and palpitations.  Gastrointestinal:  Negative for abdominal pain, diarrhea, nausea and vomiting.  Genitourinary: Negative.   Musculoskeletal:  Positive for neck pain.  Skin: Negative.  Negative for rash.  Neurological:  Negative for dizziness and headaches.  All other systems reviewed and are negative.  Today's Vitals   10/12/21 1530  BP: 120/82  Pulse: 69  Temp: 98 F (36.7 C)  TempSrc: Oral  SpO2: 92%  Weight: 203 lb 6 oz (92.3 kg)  Height: '5\' 11"'$  (1.803 m)   Body mass index is 28.37 kg/m.   Physical Exam Vitals reviewed.  Constitutional:      Appearance: Normal appearance.  HENT:     Head: Normocephalic.  Eyes:     Extraocular Movements: Extraocular movements intact.     Pupils: Pupils are equal, round, and reactive to light.  Cardiovascular:     Rate and Rhythm: Normal rate.  Pulmonary:     Effort: Pulmonary effort is normal.  Musculoskeletal:     Cervical back: Pain with movement and muscular tenderness present. No spinous process tenderness. Decreased range of motion.   Skin:    General: Skin is warm and dry.  Neurological:     General: No focal deficit present.     Mental Status: He is alert and oriented to person, place, and time.      ASSESSMENT & PLAN: A total of 33 minutes was spent with the patient and counseling/coordination of care regarding preparing for this visit, review of most recent office visit notes, review of past medical history, review of all medications, differential diagnosis of neck pain, treatment of musculoskeletal neck pain with muscle relaxants and analgesics, prognosis, documentation, and need for follow-up if no better or worse during the next week.  Problem List Items Addressed This Visit       Musculoskeletal and Integument   Neck muscle spasm    Creating pain and affecting quality of life. Start Flexeril 10 mg at bedtime. Use heat pad several times a day.      Relevant Medications   cyclobenzaprine (FLEXERIL) 10 MG tablet     Other   Musculoskeletal neck pain - Primary    No red flag signs or symptoms. Had relief from ibuprofen. Take tramadol 50 mg as needed for moderate to severe pain. Heat pad several times a day.      Relevant Medications   traMADol (ULTRAM) 50 MG tablet   Other Visit Diagnoses     Need for vaccination       Relevant Orders   Flu Vaccine QUAD High Dose(Fluad) (Completed)      Patient Instructions  Cervical Sprain A cervical sprain is also called a neck sprain. It is a stretch or tear in one or more ligaments in the neck. Ligaments are tissues that connect bones to each other. Neck sprains can be mild, bad, or very bad. A very bad sprain in the neck can cause the bones in the neck  to be unstable. This can damage the spinal cord. It can also cause serious problems in the brain, spinal cord, and nerves (nervous system). Most neck sprains heal in 4-6 weeks. It can take more or less time depending on: What caused the injury. The amount of injury. What are the causes? Neck sprains may  be caused by trauma, such as: An injury from an accident in a vehicle such as a car or boat. A fall. The head and neck being moved front to back or side to side all of a sudden (whiplash injury). Mild neck sprains may be caused by wear and tear over time. What increases the risk? The following factors may make you more likely to develop this condition: Taking part in activities that put you at high risk of hurting your neck. These include: Contact sports. Animator. Gymnastics. Diving. Taking risks when driving or riding in a vehicle such as a car or boat. Arthritis caused by wear and tear of the joints in the spine. The neck not being very strong or flexible. Having had a neck injury in the past. Poor posture. Spending a lot of time in certain positions that put stress on the neck. This may be from sitting at a computer for a long time. What are the signs or symptoms? Symptoms of this condition include: Your neck, shoulders, or upper back feeling: Painful or sore. Stiff. Tender. Swollen. Hot, or like it is burning. Sudden tightening of neck muscles (spasms). Not being able to move the neck very much. Headache. Feeling dizzy. Feeling like you may vomit, or vomiting. Having a hand or arm that: Feels weak. Loses feeling (feels numb). Tingles. You may get symptoms right away after injury, or you may get them over a few days. In some cases, symptoms may go away with treatment and come back over time. How is this treated? This condition is treated by: Resting your neck. Icing the part of your neck that is hurt. Doing exercises to restore movement and strength to your neck (physical therapy). If there is no swelling, you may use heat therapy 2-3 days after the injury took place. If your injury is very bad, treatment may also include: Keeping your neck in place for a length of time. This may be done using: A neck collar. This supports your chin and the back of your head. A  cervical traction device. This is a sling that holds up your head. The sling removes weight and pressure from your neck. It may also help to relieve pain. Medicines that help with: Pain. Irritation and swelling (inflammation). Medicines that help to relax your muscles (muscle relaxants). Surgery. This is rare. Follow these instructions at home: Medicines  Take over-the-counter and prescription medicines only as told by your doctor. Ask your doctor if the medicine prescribed to you: Requires you to avoid driving or using heavy machinery. Can cause trouble pooping (constipation). You may need to take these actions to prevent or treat trouble pooping: Drink enough fluid to keep your pee (urine) pale yellow. Take over-the-counter or prescription medicines. Eat foods that are high in fiber. These include beans, whole grains, and fresh fruits and vegetables. Limit foods that are high in fat and sugar. These include fried or sweet foods. If you have a neck collar: Wear it as told by your doctor. Do not take it off unless told. Ask your doctor before adjusting your collar. If you have long hair, keep it outside of the collar. Ask your doctor if  you may take off the collar for cleaning and bathing. If you may take off the collar: Follow instructions about how to take it off safely. Clean it by hand with mild soap and water. Let it air-dry fully. If your collar has pads that you can take out: Take the pads out every 1-2 days. Wash them by hand with soap and water. Let the pads air-dry fully before you put them back in the collar. Tell your doctor if your skin under the collar has irritation or sores. Managing pain, stiffness, and swelling     Use a cervical traction device, if told by your doctor. If told, put ice on the affected area. To do this: Put ice in a plastic bag. Place a towel between your skin and the bag. Leave the ice on for 20 minutes, 2-3 times a day. If told, put heat on  the affected area. Do this before exercise or as often as told by your doctor. Use the heat source that your doctor recommends, such as a moist heat pack or a heating pad. Place a towel between your skin and the heat source. Leave the heat on for 20-30 minutes. Take the heat off if your skin turns bright red. This is very important if you cannot feel pain, heat, or cold. You may have a greater risk of getting burned. Activity Do not drive while wearing a neck collar. If you do not have a neck collar, ask if it is safe to drive while your neck heals. Do not lift anything that is heavier than 10 lb (4.5 kg), or the limit that you are told, until your doctor tells you that it is safe. Rest as told by your doctor. Do exercises as told by your doctor or physical therapist. Return to your normal activities as told by your doctor. Avoid positions and activities that make you feel worse. Ask your doctor what activities are safe for you. General instructions Do not use any products that contain nicotine or tobacco, such as cigarettes, e-cigarettes, and chewing tobacco. These can delay healing. If you need help quitting, ask your doctor. Keep all follow-up visits as told by your doctor or physical therapist. This is important. How is this prevented? To prevent a neck sprain from happening again: Practice good posture. Adjust your workstation to help you do this. Exercise regularly as told by your doctor or physical therapist. Avoid activities that are risky or may cause a neck sprain. Contact a doctor if: Your symptoms get worse. Your symptoms do not get better after 2 weeks of treatment. Your pain gets worse. Medicine does not help your pain. You have new symptoms that you cannot explain. Your neck collar gives you sores on your skin or bothers your skin. Get help right away if: You have very bad pain. You get any of the following in any part of your body: Loss of  feeling. Tingling. Weakness. You cannot move a part of your body. You have neck pain and either of these: Very bad dizziness. A very bad headache. Summary A cervical sprain is also called a neck sprain. It is a stretch or tear in one or more ligaments in the neck. Ligaments are tissues that connect bones. Neck sprains may be caused by trauma, such as an injury or a fall. You may get symptoms right away after injury, or you may get them over a few days. Neck sprains may be treated with rest, heat, ice, medicines, exercise, and surgery. This information  is not intended to replace advice given to you by your health care provider. Make sure you discuss any questions you have with your health care provider. Document Revised: 05/02/2021 Document Reviewed: 10/02/2018 Elsevier Patient Education  [place], MD [facility]

## 2021-10-17 DIAGNOSIS — Z0100 Encounter for examination of eyes and vision without abnormal findings: Secondary | ICD-10-CM | POA: Diagnosis not present

## 2021-10-17 DIAGNOSIS — H2513 Age-related nuclear cataract, bilateral: Secondary | ICD-10-CM | POA: Diagnosis not present

## 2021-11-03 DIAGNOSIS — G44009 Cluster headache syndrome, unspecified, not intractable: Secondary | ICD-10-CM | POA: Diagnosis not present

## 2021-11-11 DIAGNOSIS — L570 Actinic keratosis: Secondary | ICD-10-CM | POA: Diagnosis not present

## 2021-11-11 DIAGNOSIS — L814 Other melanin hyperpigmentation: Secondary | ICD-10-CM | POA: Diagnosis not present

## 2021-11-11 DIAGNOSIS — L821 Other seborrheic keratosis: Secondary | ICD-10-CM | POA: Diagnosis not present

## 2021-11-11 DIAGNOSIS — Z86006 Personal history of melanoma in-situ: Secondary | ICD-10-CM | POA: Diagnosis not present

## 2021-11-11 DIAGNOSIS — D1801 Hemangioma of skin and subcutaneous tissue: Secondary | ICD-10-CM | POA: Diagnosis not present

## 2021-11-11 DIAGNOSIS — D485 Neoplasm of uncertain behavior of skin: Secondary | ICD-10-CM | POA: Diagnosis not present

## 2021-11-11 DIAGNOSIS — Z08 Encounter for follow-up examination after completed treatment for malignant neoplasm: Secondary | ICD-10-CM | POA: Diagnosis not present

## 2021-11-11 DIAGNOSIS — C4442 Squamous cell carcinoma of skin of scalp and neck: Secondary | ICD-10-CM | POA: Diagnosis not present

## 2021-11-11 DIAGNOSIS — E860 Dehydration: Secondary | ICD-10-CM | POA: Diagnosis not present

## 2021-11-20 DIAGNOSIS — U071 COVID-19: Secondary | ICD-10-CM | POA: Diagnosis not present

## 2021-12-06 ENCOUNTER — Ambulatory Visit: Payer: Medicare HMO | Admitting: Physician Assistant

## 2021-12-19 DIAGNOSIS — C4442 Squamous cell carcinoma of skin of scalp and neck: Secondary | ICD-10-CM | POA: Diagnosis not present

## 2021-12-19 DIAGNOSIS — L905 Scar conditions and fibrosis of skin: Secondary | ICD-10-CM | POA: Diagnosis not present

## 2022-01-03 DIAGNOSIS — M542 Cervicalgia: Secondary | ICD-10-CM | POA: Diagnosis not present

## 2022-01-03 DIAGNOSIS — M5030 Other cervical disc degeneration, unspecified cervical region: Secondary | ICD-10-CM | POA: Diagnosis not present

## 2022-01-03 DIAGNOSIS — M47812 Spondylosis without myelopathy or radiculopathy, cervical region: Secondary | ICD-10-CM | POA: Diagnosis not present

## 2022-01-12 DIAGNOSIS — M47812 Spondylosis without myelopathy or radiculopathy, cervical region: Secondary | ICD-10-CM | POA: Diagnosis not present

## 2022-01-16 DIAGNOSIS — M47812 Spondylosis without myelopathy or radiculopathy, cervical region: Secondary | ICD-10-CM | POA: Diagnosis not present

## 2022-01-27 DIAGNOSIS — M47812 Spondylosis without myelopathy or radiculopathy, cervical region: Secondary | ICD-10-CM | POA: Diagnosis not present

## 2022-02-03 DIAGNOSIS — M47812 Spondylosis without myelopathy or radiculopathy, cervical region: Secondary | ICD-10-CM | POA: Diagnosis not present

## 2022-02-09 ENCOUNTER — Other Ambulatory Visit: Payer: Self-pay | Admitting: Physical Medicine and Rehabilitation

## 2022-02-09 DIAGNOSIS — M542 Cervicalgia: Secondary | ICD-10-CM

## 2022-02-09 DIAGNOSIS — M47812 Spondylosis without myelopathy or radiculopathy, cervical region: Secondary | ICD-10-CM | POA: Diagnosis not present

## 2022-02-20 ENCOUNTER — Other Ambulatory Visit: Payer: Self-pay | Admitting: Emergency Medicine

## 2022-02-28 ENCOUNTER — Other Ambulatory Visit: Payer: Medicare HMO

## 2022-03-01 ENCOUNTER — Other Ambulatory Visit: Payer: Medicare HMO

## 2022-03-07 ENCOUNTER — Ambulatory Visit
Admission: RE | Admit: 2022-03-07 | Discharge: 2022-03-07 | Disposition: A | Discharge status: Home / Self Care | Payer: Medicare HMO | Source: Ambulatory Visit | Attending: Physical Medicine and Rehabilitation | Admitting: Physical Medicine and Rehabilitation

## 2022-03-07 DIAGNOSIS — M4802 Spinal stenosis, cervical region: Secondary | ICD-10-CM | POA: Diagnosis not present

## 2022-03-07 DIAGNOSIS — M542 Cervicalgia: Secondary | ICD-10-CM

## 2022-03-09 DIAGNOSIS — M542 Cervicalgia: Secondary | ICD-10-CM | POA: Diagnosis not present

## 2022-03-09 DIAGNOSIS — M47812 Spondylosis without myelopathy or radiculopathy, cervical region: Secondary | ICD-10-CM | POA: Diagnosis not present

## 2022-03-15 ENCOUNTER — Telehealth: Payer: Self-pay | Admitting: Emergency Medicine

## 2022-03-15 DIAGNOSIS — Z0000 Encounter for general adult medical examination without abnormal findings: Secondary | ICD-10-CM

## 2022-03-15 NOTE — Telephone Encounter (Signed)
Pt is requesting we put lab orders in for his physical (3.18) so he can get them done before hand on 3.11

## 2022-03-15 NOTE — Telephone Encounter (Signed)
CBC, CMP, lipid profile, hemoglobin A1c, PSA,

## 2022-03-15 NOTE — Telephone Encounter (Signed)
Please advise on Lab orders

## 2022-03-15 NOTE — Telephone Encounter (Signed)
Labs orders placed. Patient is aware

## 2022-03-15 NOTE — Addendum Note (Signed)
Addended by: Rae Mar on: 03/15/2022 04:43 PM   Modules accepted: Orders

## 2022-04-17 ENCOUNTER — Other Ambulatory Visit (INDEPENDENT_AMBULATORY_CARE_PROVIDER_SITE_OTHER): Payer: Medicare HMO

## 2022-04-17 DIAGNOSIS — Z0000 Encounter for general adult medical examination without abnormal findings: Secondary | ICD-10-CM

## 2022-04-17 LAB — LIPID PANEL
Cholesterol: 175 mg/dL (ref 0–200)
HDL: 66.7 mg/dL (ref >39.00)
LDL Cholesterol: 77 mg/dL (ref 0–99)
NonHDL: 107.86
Total CHOL/HDL Ratio: 3
Triglycerides: 153 mg/dL — ABNORMAL HIGH (ref 0.0–149.0)
VLDL: 30.6 mg/dL (ref 0.0–40.0)

## 2022-04-17 LAB — CBC
HCT: 43.5 % (ref 39.0–52.0)
Hemoglobin: 15 g/dL (ref 13.0–17.0)
MCHC: 34.4 g/dL (ref 30.0–36.0)
MCV: 91.7 fl (ref 78.0–100.0)
Platelets: 163 10*3/uL (ref 150.0–400.0)
RBC: 4.75 Mil/uL (ref 4.22–5.81)
RDW: 13.8 % (ref 11.5–15.5)
WBC: 6.1 10*3/uL (ref 4.0–10.5)

## 2022-04-17 LAB — COMPREHENSIVE METABOLIC PANEL
ALT: 29 U/L (ref 0–53)
AST: 21 U/L (ref 0–37)
Albumin: 4.1 g/dL (ref 3.5–5.2)
Alkaline Phosphatase: 47 U/L (ref 39–117)
BUN: 22 mg/dL (ref 6–23)
CO2: 26 mEq/L (ref 19–32)
Calcium: 9.3 mg/dL (ref 8.4–10.5)
Chloride: 106 mEq/L (ref 96–112)
Creatinine, Ser: 0.99 mg/dL (ref 0.40–1.50)
GFR: 76.89 mL/min (ref >60.00)
Glucose, Bld: 93 mg/dL (ref 70–99)
Potassium: 4.2 mEq/L (ref 3.5–5.1)
Sodium: 140 mEq/L (ref 135–145)
Total Bilirubin: 0.6 mg/dL (ref 0.2–1.2)
Total Protein: 6.6 g/dL (ref 6.0–8.3)

## 2022-04-17 LAB — HEMOGLOBIN A1C: Hgb A1c MFr Bld: 5.8 % (ref 4.6–6.5)

## 2022-04-17 LAB — PSA: PSA: 0 ng/mL — ABNORMAL LOW (ref 0.10–4.00)

## 2022-04-17 NOTE — Addendum Note (Signed)
Addended by: Jacobo Forest on: 04/17/2022 09:00 AM   Modules accepted: Orders

## 2022-04-20 ENCOUNTER — Other Ambulatory Visit: Payer: Self-pay | Admitting: Emergency Medicine

## 2022-04-24 ENCOUNTER — Encounter: Payer: Self-pay | Admitting: Emergency Medicine

## 2022-04-24 ENCOUNTER — Ambulatory Visit (INDEPENDENT_AMBULATORY_CARE_PROVIDER_SITE_OTHER): Payer: Medicare HMO | Admitting: Emergency Medicine

## 2022-04-24 VITALS — BP 128/76 | HR 62 | Temp 97.8°F | Ht 71.0 in | Wt 210.5 lb

## 2022-04-24 DIAGNOSIS — E785 Hyperlipidemia, unspecified: Secondary | ICD-10-CM | POA: Diagnosis not present

## 2022-04-24 DIAGNOSIS — G4700 Insomnia, unspecified: Secondary | ICD-10-CM

## 2022-04-24 DIAGNOSIS — Z0000 Encounter for general adult medical examination without abnormal findings: Secondary | ICD-10-CM | POA: Diagnosis not present

## 2022-04-24 MED ORDER — ZOLPIDEM TARTRATE 10 MG PO TABS: 10.0000 mg | ORAL_TABLET | Freq: Every evening | ORAL | 1 refills | Status: DC | PRN

## 2022-04-24 MED ORDER — ROSUVASTATIN CALCIUM 10 MG PO TABS: 10.0000 mg | ORAL_TABLET | Freq: Every day | ORAL | 3 refills | Status: DC

## 2022-04-24 NOTE — Assessment & Plan Note (Signed)
Mild elevation of triglycerides otherwise normal lipid profile. Recommend to continue rosuvastatin 10 mg daily. The 10-year ASCVD risk score (Arnett DK, et al., 2019) is: 16.2%   Values used to calculate the score:     Age: 72 years     Sex: Male     Is Non-Hispanic African American: No     Diabetic: No     Tobacco smoker: No     Systolic Blood Pressure: 0000000 mmHg     Is BP treated: No     HDL Cholesterol: 66.7 mg/dL     Total Cholesterol: 175 mg/dL

## 2022-04-24 NOTE — Assessment & Plan Note (Signed)
Ambien working well for him. Continue as needed

## 2022-04-24 NOTE — Patient Instructions (Signed)
Health Maintenance After Age 71 After age 71, you are at a higher risk for certain long-term diseases and infections as well as injuries from falls. Falls are a major cause of broken bones and head injuries in people who are older than age 71. Getting regular preventive care can help to keep you healthy and well. Preventive care includes getting regular testing and making lifestyle changes as recommended by your health care provider. Talk with your health care provider about: Which screenings and tests you should have. A screening is a test that checks for a disease when you have no symptoms. A diet and exercise plan that is right for you. What should I know about screenings and tests to prevent falls? Screening and testing are the best ways to find a health problem early. Early diagnosis and treatment give you the best chance of managing medical conditions that are common after age 71. Certain conditions and lifestyle choices may make you more likely to have a fall. Your health care provider may recommend: Regular vision checks. Poor vision and conditions such as cataracts can make you more likely to have a fall. If you wear glasses, make sure to get your prescription updated if your vision changes. Medicine review. Work with your health care provider to regularly review all of the medicines you are taking, including over-the-counter medicines. Ask your health care provider about any side effects that may make you more likely to have a fall. Tell your health care provider if any medicines that you take make you feel dizzy or sleepy. Strength and balance checks. Your health care provider may recommend certain tests to check your strength and balance while standing, walking, or changing positions. Foot health exam. Foot pain and numbness, as well as not wearing proper footwear, can make you more likely to have a fall. Screenings, including: Osteoporosis screening. Osteoporosis is a condition that causes  the bones to get weaker and break more easily. Blood pressure screening. Blood pressure changes and medicines to control blood pressure can make you feel dizzy. Depression screening. You may be more likely to have a fall if you have a fear of falling, feel depressed, or feel unable to do activities that you used to do. Alcohol use screening. Using too much alcohol can affect your balance and may make you more likely to have a fall. Follow these instructions at home: Lifestyle Do not drink alcohol if: Your health care provider tells you not to drink. If you drink alcohol: Limit how much you have to: 0-1 drink a day for women. 0-2 drinks a day for men. Know how much alcohol is in your drink. In the [place] one drink equals one 12 oz bottle of beer (355 mL), one 5 oz glass of wine (148 mL), or one 1 oz glass of hard liquor (44 mL). Do not use any products that contain nicotine or tobacco. These products include cigarettes, chewing tobacco, and vaping devices, such as e-cigarettes. If you need help quitting, ask your health care provider. Activity  Follow a regular exercise program to stay fit. This will help you maintain your balance. Ask your health care provider what types of exercise are appropriate for you. If you need a cane or walker, use it as recommended by your health care provider. Wear supportive shoes that have nonskid soles. Safety  Remove any tripping hazards, such as rugs, cords, and clutter. Install safety equipment such as grab bars in bathrooms and safety rails on stairs. Keep rooms and walkways   well-lit. General instructions Talk with your health care provider about your risks for falling. Tell your health care provider if: You fall. Be sure to tell your health care provider about all falls, even ones that seem minor. You feel dizzy, tiredness (fatigue), or off-balance. Take over-the-counter and prescription medicines only as told by your health care provider. These include  supplements. Eat a healthy diet and maintain a healthy weight. A healthy diet includes low-fat dairy products, low-fat (lean) meats, and fiber from whole grains, beans, and lots of fruits and vegetables. Stay current with your vaccines. Schedule regular health, dental, and eye exams. Summary Having a healthy lifestyle and getting preventive care can help to protect your health and wellness after age 71. Screening and testing are the best way to find a health problem early and help you avoid having a fall. Early diagnosis and treatment give you the best chance for managing medical conditions that are more common for people who are older than age 71. Falls are a major cause of broken bones and head injuries in people who are older than age 71. Take precautions to prevent a fall at home. Work with your health care provider to learn what changes you can make to improve your health and wellness and to prevent falls. This information is not intended to replace advice given to you by your health care provider. Make sure you discuss any questions you have with your health care provider. Document Revised: 06/14/2020 Document Reviewed: 06/14/2020 Elsevier Patient Education  2023 Elsevier Inc.  

## 2022-04-24 NOTE — Progress Notes (Signed)
Allen Burnett 73 y.o.   Chief Complaint  Patient presents with   Annual Exam    No concerns     HISTORY OF PRESENT ILLNESS: This is a 71 y.o. male A1A here for annual exam. Overall doing well. Stays physically active.  Non-smoker. Good eating habits. Has no complaints or medical concerns today. Recent blood work results reviewed with patient. Normal CBC, normal CMP, mild elevation of triglycerides otherwise normal cholesterol, negative PSA.  Normal hemoglobin A1c..  No concerns. Family history of colon cancer.  Personal history of colonic polyps.  Colonoscopies every 3 years for surveillance.  HPI   Prior to Admission medications   Medication Sig Start Date End Date Taking? Authorizing Provider  diclofenac (VOLTAREN) 75 MG EC tablet Take 75 mg by mouth 2 (two) times daily. 03/23/22  Yes [provider]  ibuprofen (ADVIL) 600 MG tablet Take 1 tablet (600 mg total) by mouth every 8 (eight) hours as needed. 05/12/21  Yes Ailene Ards, NP  Multiple Vitamin (MULTIVITAMIN WITH MINERALS) TABS tablet Take 1 tablet by mouth daily.   Yes [provider]  rosuvastatin (CRESTOR) 10 MG tablet Take 1 tablet (10 mg total) by mouth daily. 04/24/22   Horald Pollen, MD  zolpidem (AMBIEN) 10 MG tablet Take 1 tablet (10 mg total) by mouth at bedtime as needed for sleep. 04/24/22   Horald Pollen, MD    No Known Allergies  Patient Active Problem List   Diagnosis Date Noted   Irregular heart beat 05/12/2021   Melanoma of skin, site unspecified 10/29/2013   Hyperlipidemia 10/02/2012   Prostate cancer ([place]) 11/08/2011    Past Medical History:  Diagnosis Date   Acid reflux OCCASIONALLY--  TAKE PREVACID   Atypical nevus 02/20/2006   slight to moderate - right paraspinal, lower back   Atypical nevus 10/01/2013   moderate to severe - upper paraspinal (WS)   Atypical nevus 10/01/2013   moderate - right upper paraspinal (WS)   Atypical nevus 04/27/2016   moderate -  right upper back   Basal cell carcinoma 02/20/2006   left medial chest   Basal cell carcinoma 11/05/2013   right upper paraspinal (recheck in 3 months)   Chronic cluster headache FOLLOWED AT [facility]   PRN OXYGEN VIA [place] WHEN FEELS HEADACHE STARTING/  ON PREVENTION MEDS   Halo nevus 03/30/2016   moderate - right upper back (WS)   Hx of radiation therapy 12/28/11   prostate seed implant/75 seeds/,Dr.Dahlstedt   Hyperlipidemia    Lentigo maligna ([place]) 01/08/1998   right angle of jaw - excision   Melanoma ([place]) 02/20/2006   left lateral chest - excision   Melanoma in situ ([place]) 07/27/2009   right lobe of ear   Melanoma in situ ([place]) 10/01/2013   left jawline (MOHs)   Melanoma in situ ([place]) 10/01/2013   left cheek (MOHs)   Prostate cancer ([place]) DX  10/11/11   Adenocarcinoma,gleason 3+3=6,PSA=5.39   volume=30cc   PSA elevation    2009=3.4,2010=3.46,2011=3.16,2012=3.76,07/2011=4.85   SCCA (squamous cell carcinoma) of skin 03/03/2020   Right Lower Shin (Keratoacanthoma)   SCCA (squamous cell carcinoma) of skin 03/03/2020   Right Dorsal Hand (in situ)   Squamous cell carcinoma of dorsum of right hand 08/18/1997   CX3 + 5FU   Squamous cell carcinoma of skin 10/26/2015   Right thigh, proximal   Squamous cell carcinoma, face 10/01/2013   Right forehead - CX3 + 5FU   Squamous cell carcinoma, face 11/15/2017  Right bulb nose - tx p bx    Past Surgical History:  Procedure Laterality Date   APPENDECTOMY  AGE 7   COLONOSCOPY     SEVERAL AT [place] FOR [facility], NO POLYPS PER PT   PROSTATE BIOPSY  10/11/2011   gleason 3+3=6,PSA=5.39,Adenocarcinoma   PROSTATE SURGERY     RADIOACTIVE SEED IMPLANT  12/28/2011   Procedure: RADIOACTIVE SEED IMPLANT;  Surgeon: Franchot Gallo, MD;  Location: [facility];  Service: Urology;  Laterality: N/A;  75 seeds implanted  no seeds found in bladder    Social History   Socioeconomic History   Marital status: Married     Spouse name: Not on file   Number of children: 2   Years of education: Not on file   Highest education level: Not on file  Occupational History    Comment: advertising sales/ retired   Occupation: [employer]    Comment: Part time  Tobacco Use   Smoking status: Former    Packs/day: 0.25    Years: 10.00    Additional pack years: 0.00    Total pack years: 2.50    Types: Cigarettes    Quit date: 07/24/1984    Years since quitting: 37.7   Smokeless tobacco: Former    Types: Chew   Tobacco comments:    light smoker... quit 40 yrs ago  Substance and Sexual Activity   Alcohol use: Yes    Alcohol/week: 6.0 - 8.0 standard drinks of alcohol    Types: 6 - 8 Standard drinks or equivalent per week    Comment: beer on weekends   Drug use: No   Sexual activity: Yes    Partners: Female  Other Topics Concern   Not on file  Social History Narrative   Not on file   Social Determinants of Health   Financial Resource Strain: Low Risk  (07/01/2021)   Overall Financial Resource Strain (CARDIA)    Difficulty of Paying Living Expenses: Not hard at all  Food Insecurity: No Food Insecurity (07/01/2021)   Hunger Vital Sign    Worried About Running Out of Food in the Last Year: Never true    [place] in the Last Year: Never true  Transportation Needs: No Transportation Needs (07/01/2021)   PRAPARE - Hydrologist (Medical): No    Lack of Transportation (Non-Medical): No  Physical Activity: Insufficiently Active (07/01/2021)   Exercise Vital Sign    Days of Exercise per Week: 3 days    Minutes of Exercise per Session: 40 min  Stress: No Stress Concern Present (07/01/2021)   [place]    Feeling of Stress : Not at all  Social Connections: Moderately Integrated (07/01/2021)   Social Connection and Isolation Panel [NHANES]    Frequency of Communication with Friends and Family: More than three  times a week    Frequency of Social Gatherings with Friends and Family: More than three times a week    Attends Religious Services: 1 to 4 times per year    Active Member of [employer] or Organizations: No    Attends Archivist Meetings: Never    Marital Status: Married  Human resources officer Violence: Not At Risk (07/01/2021)   Humiliation, Afraid, Rape, and Kick questionnaire    Fear of Current or Ex-Partner: No    Emotionally Abused: No    Physically Abused: No    Sexually Abused: No  Family History  Problem Relation Age of Onset   Hyperlipidemia Mother    Colon polyps Father    Colon cancer Brother 66   Cancer Brother 47       colon cancer now age 12   Heart disease Maternal Grandfather    Esophageal cancer Neg Hx    Rectal cancer Neg Hx    Stomach cancer Neg Hx      Review of Systems  Constitutional: Negative.  Negative for chills and fever.  HENT: Negative.  Negative for congestion and sore throat.   Respiratory: Negative.  Negative for cough and shortness of breath.   Cardiovascular: Negative.  Negative for chest pain and palpitations.  Gastrointestinal:  Negative for abdominal pain, diarrhea, nausea and vomiting.  Genitourinary: Negative.  Negative for dysuria and hematuria.  Skin: Negative.  Negative for rash.  Neurological: Negative.  Negative for dizziness and headaches.  All other systems reviewed and are negative.   Vitals:   04/24/22 1050  BP: 128/76  Pulse: 62  Temp: 97.8 F (36.6 C)  SpO2: 97%    Physical Exam Vitals reviewed.  Constitutional:      Appearance: Normal appearance.  HENT:     Head: Normocephalic.     Right Ear: Tympanic membrane, ear canal and external ear normal.     Left Ear: Tympanic membrane, ear canal and external ear normal.     Mouth/Throat:     Mouth: Mucous membranes are moist.     Pharynx: Oropharynx is clear.  Eyes:     Extraocular Movements: Extraocular movements intact.     Conjunctiva/sclera: Conjunctivae  normal.     Pupils: Pupils are equal, round, and reactive to light.  Cardiovascular:     Rate and Rhythm: Normal rate and regular rhythm.     Pulses: Normal pulses.     Heart sounds: Normal heart sounds.  Pulmonary:     Effort: Pulmonary effort is normal.     Breath sounds: Normal breath sounds.  Abdominal:     Palpations: Abdomen is soft.     Tenderness: There is no abdominal tenderness.  Musculoskeletal:     Cervical back: No tenderness.     Right lower leg: No edema.     Left lower leg: No edema.  Lymphadenopathy:     Cervical: No cervical adenopathy.  Skin:    General: Skin is warm and dry.     Capillary Refill: Capillary refill takes less than 2 seconds.  Neurological:     General: No focal deficit present.     Mental Status: He is alert and oriented to person, place, and time.  Psychiatric:        Mood and Affect: Mood normal.        Behavior: Behavior normal.      ASSESSMENT & PLAN: Problem List Items Addressed This Visit       Other   Dyslipidemia    Mild elevation of triglycerides otherwise normal lipid profile. Recommend to continue rosuvastatin 10 mg daily. The 10-year ASCVD risk score (Arnett DK, et al., 2019) is: 16.2%   Values used to calculate the score:     Age: 4 years     Sex: Male     Is Non-Hispanic African American: No     Diabetic: No     Tobacco smoker: No     Systolic Blood Pressure: 0000000 mmHg     Is BP treated: No     HDL Cholesterol: 66.7 mg/dL  Total Cholesterol: 175 mg/dL       Relevant Medications   rosuvastatin (CRESTOR) 10 MG tablet   Insomnia    Ambien working well for him. Continue as needed      Relevant Medications   zolpidem (AMBIEN) 10 MG tablet   Other Visit Diagnoses     Routine general medical examination at a health care facility    -  Primary      Modifiable risk factors discussed with patient. Anticipatory guidance according to age provided. The following topics were also discussed: Social  Determinants of Health Smoking.  Non-smoker Diet and nutrition Benefits of exercise Cancer screening and review of colonoscopy report done last summer Vaccinations reviewed and recommendations Cardiovascular risk assessment The 10-year ASCVD risk score (Arnett DK, et al., 2019) is: 16.2%   Values used to calculate the score:     Age: 23 years     Sex: Male     Is Non-Hispanic African American: No     Diabetic: No     Tobacco smoker: No     Systolic Blood Pressure: 0000000 mmHg     Is BP treated: No     HDL Cholesterol: 66.7 mg/dL     Total Cholesterol: 175 mg/dL Chronic insomnia management Review of all medications Mental health including depression and anxiety Fall and accident prevention  Patient Instructions  Health Maintenance After Age 9 After age 76, you are at a higher risk for certain long-term diseases and infections as well as injuries from falls. Falls are a major cause of broken bones and head injuries in people who are older than age 16. Getting regular preventive care can help to keep you healthy and well. Preventive care includes getting regular testing and making lifestyle changes as recommended by your health care provider. Talk with your health care provider about: Which screenings and tests you should have. A screening is a test that checks for a disease when you have no symptoms. A diet and exercise plan that is right for you. What should I know about screenings and tests to prevent falls? Screening and testing are the best ways to find a health problem early. Early diagnosis and treatment give you the best chance of managing medical conditions that are common after age 65. Certain conditions and lifestyle choices may make you more likely to have a fall. Your health care provider may recommend: Regular vision checks. Poor vision and conditions such as cataracts can make you more likely to have a fall. If you wear glasses, make sure to get your prescription updated if  your vision changes. Medicine review. Work with your health care provider to regularly review all of the medicines you are taking, including over-the-counter medicines. Ask your health care provider about any side effects that may make you more likely to have a fall. Tell your health care provider if any medicines that you take make you feel dizzy or sleepy. Strength and balance checks. Your health care provider may recommend certain tests to check your strength and balance while standing, walking, or changing positions. Foot health exam. Foot pain and numbness, as well as not wearing proper footwear, can make you more likely to have a fall. Screenings, including: Osteoporosis screening. Osteoporosis is a condition that causes the bones to get weaker and break more easily. Blood pressure screening. Blood pressure changes and medicines to control blood pressure can make you feel dizzy. Depression screening. You may be more likely to have a fall if you have a  fear of falling, feel depressed, or feel unable to do activities that you used to do. Alcohol use screening. Using too much alcohol can affect your balance and may make you more likely to have a fall. Follow these instructions at home: Lifestyle Do not drink alcohol if: Your health care provider tells you not to drink. If you drink alcohol: Limit how much you have to: 0-1 drink a day for women. 0-2 drinks a day for men. Know how much alcohol is in your drink. In the [place] one drink equals one 12 oz bottle of beer (355 mL), one 5 oz glass of wine (148 mL), or one 1 oz glass of hard liquor (44 mL). Do not use any products that contain nicotine or tobacco. These products include cigarettes, chewing tobacco, and vaping devices, such as e-cigarettes. If you need help quitting, ask your health care provider. Activity  Follow a regular exercise program to stay fit. This will help you maintain your balance. Ask your health care provider what types  of exercise are appropriate for you. If you need a cane or walker, use it as recommended by your health care provider. Wear supportive shoes that have nonskid soles. Safety  Remove any tripping hazards, such as rugs, cords, and clutter. Install safety equipment such as grab bars in bathrooms and safety rails on stairs. Keep rooms and walkways well-lit. General instructions Talk with your health care provider about your risks for falling. Tell your health care provider if: You fall. Be sure to tell your health care provider about all falls, even ones that seem minor. You feel dizzy, tiredness (fatigue), or off-balance. Take over-the-counter and prescription medicines only as told by your health care provider. These include supplements. Eat a healthy diet and maintain a healthy weight. A healthy diet includes low-fat dairy products, low-fat (lean) meats, and fiber from whole grains, beans, and lots of fruits and vegetables. Stay current with your vaccines. Schedule regular health, dental, and eye exams. Summary Having a healthy lifestyle and getting preventive care can help to protect your health and wellness after age 10. Screening and testing are the best way to find a health problem early and help you avoid having a fall. Early diagnosis and treatment give you the best chance for managing medical conditions that are more common for people who are older than age 31. Falls are a major cause of broken bones and head injuries in people who are older than age 108. Take precautions to prevent a fall at home. Work with your health care provider to learn what changes you can make to improve your health and wellness and to prevent falls. This information is not intended to replace advice given to you by your health care provider. Make sure you discuss any questions you have with your health care provider. Document Revised: 06/14/2020 Document Reviewed: 06/14/2020 Elsevier Patient Education  [place], MD [facility]

## 2022-05-15 DIAGNOSIS — Z85828 Personal history of other malignant neoplasm of skin: Secondary | ICD-10-CM | POA: Diagnosis not present

## 2022-05-15 DIAGNOSIS — Z08 Encounter for follow-up examination after completed treatment for malignant neoplasm: Secondary | ICD-10-CM | POA: Diagnosis not present

## 2022-05-15 DIAGNOSIS — L578 Other skin changes due to chronic exposure to nonionizing radiation: Secondary | ICD-10-CM | POA: Diagnosis not present

## 2022-05-15 DIAGNOSIS — Z86006 Personal history of melanoma in-situ: Secondary | ICD-10-CM | POA: Diagnosis not present

## 2022-05-15 DIAGNOSIS — L821 Other seborrheic keratosis: Secondary | ICD-10-CM | POA: Diagnosis not present

## 2022-05-15 DIAGNOSIS — D229 Melanocytic nevi, unspecified: Secondary | ICD-10-CM | POA: Diagnosis not present

## 2022-05-15 DIAGNOSIS — L814 Other melanin hyperpigmentation: Secondary | ICD-10-CM | POA: Diagnosis not present

## 2022-05-25 ENCOUNTER — Telehealth: Payer: Self-pay

## 2022-05-25 NOTE — Telephone Encounter (Signed)
Contacted Allen Burnett to schedule their annual wellness visit. Appointment made for 05/30/22.  Allen Burnett, CMA (AAMA)  [facility] [phone]

## 2022-06-05 ENCOUNTER — Ambulatory Visit (INDEPENDENT_AMBULATORY_CARE_PROVIDER_SITE_OTHER): Payer: Medicare HMO

## 2022-06-05 VITALS — Ht 71.0 in | Wt 205.0 lb

## 2022-06-05 DIAGNOSIS — Z0000 Encounter for general adult medical examination without abnormal findings: Secondary | ICD-10-CM

## 2022-06-05 NOTE — Progress Notes (Signed)
I connected with  Allen Burnett on 06/05/22 by a audio enabled telemedicine application and verified that I am speaking with the correct person using two identifiers.  Patient Location: Home  Provider Location: Office/Clinic  I discussed the limitations of evaluation and management by telemedicine. The patient expressed understanding and agreed to proceed.  Patient Medicare AWV questionnaire was completed by the patient on 06/03/2022; I have confirmed that all information answered by patient is correct and no changes since this date.     Subjective:   Allen Burnett is a 72 y.o. male who presents for Medicare Annual/Subsequent preventive examination.  Review of Systems     Cardiac Risk Factors include: advanced age (>61men, >4 women);family history of premature cardiovascular disease;dyslipidemia;male gender     Objective:    Today's Vitals   06/05/22 0951 06/05/22 0952  Weight: 205 lb (93 kg)   Height: 5\' 11"  (1.803 m)   PainSc: 0-No pain 0-No pain   Body mass index is 28.59 kg/m.     06/05/2022    9:53 AM 03/15/2016    8:15 AM 12/28/2011    7:18 AM  Advanced Directives  Does Patient Have a Medical Advance Directive? Yes No Patient would not like information  Type of Advance Directive Healthcare Power of [place];Living will    Copy of Healthcare Power of Attorney in Chart? No - copy requested    Pre-existing out of facility DNR order (yellow form or pink MOST form)   No    Current Medications (verified) Outpatient Encounter Medications as of 06/05/2022  Medication Sig   diclofenac (VOLTAREN) 75 MG EC tablet Take 75 mg by mouth 2 (two) times daily.   ibuprofen (ADVIL) 600 MG tablet Take 1 tablet (600 mg total) by mouth every 8 (eight) hours as needed.   Multiple Vitamin (MULTIVITAMIN WITH MINERALS) TABS tablet Take 1 tablet by mouth daily.   rosuvastatin (CRESTOR) 10 MG tablet Take 1 tablet (10 mg total) by mouth daily.   zolpidem (AMBIEN) 10 MG tablet Take 1 tablet (10  mg total) by mouth at bedtime as needed for sleep.   No facility-administered encounter medications on file as of 06/05/2022.    Allergies (verified) Patient has no known allergies.   History: Past Medical History:  Diagnosis Date   Acid reflux OCCASIONALLY--  TAKE PREVACID   Atypical nevus 02/20/2006   slight to moderate - right paraspinal, lower back   Atypical nevus 10/01/2013   moderate to severe - upper paraspinal (WS)   Atypical nevus 10/01/2013   moderate - right upper paraspinal (WS)   Atypical nevus 04/27/2016   moderate - right upper back   Basal cell carcinoma 02/20/2006   left medial chest   Basal cell carcinoma 11/05/2013   right upper paraspinal (recheck in 3 months)   Chronic cluster [facility]   PRN OXYGEN VIA [place] WHEN FEELS HEADACHE STARTING/  ON PREVENTION MEDS   Halo nevus 03/30/2016   moderate - right upper back (WS)   Hx of radiation therapy 12/28/11   prostate seed implant/75 seeds/,Dr.Dahlstedt   Hyperlipidemia    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   right angle of jaw - excision   Melanoma (HCC) 02/20/2006   left lateral chest - excision   Melanoma in situ (HCC) 07/27/2009   right lobe of ear   Melanoma in situ (HCC) 10/01/2013   left jawline (MOHs)   Melanoma in situ (HCC) 10/01/2013   left cheek [facility])   Prostate cancer (HCC)  DX  10/11/11   Adenocarcinoma,gleason 3+3=6,PSA=5.39   volume=30cc   PSA elevation    2009=3.4,2010=3.46,2011=3.16,2012=3.76,07/2011=4.85   SCCA (squamous cell carcinoma) of skin 03/03/2020   Right Lower Shin (Keratoacanthoma)   SCCA (squamous cell carcinoma) of skin 03/03/2020   Right Dorsal Hand (in situ)   Squamous cell carcinoma of dorsum of right hand 08/18/1997   CX3 + 5FU   Squamous cell carcinoma of skin 10/26/2015   Right thigh, proximal   Squamous cell carcinoma, face 10/01/2013   Right forehead - CX3 + 5FU   Squamous cell carcinoma, face 11/15/2017   Right bulb nose - tx p bx    Past Surgical History:  Procedure Laterality Date   APPENDECTOMY  AGE 53   COLONOSCOPY     SEVERAL AT [place] FOR [facility], NO POLYPS PER PT   PROSTATE BIOPSY  10/11/2011   gleason 3+3=6,PSA=5.39,Adenocarcinoma   PROSTATE SURGERY     RADIOACTIVE SEED IMPLANT  12/28/2011   Procedure: RADIOACTIVE SEED IMPLANT;  Surgeon: Marcine Matar, MD;  Location: [facility];  Service: Urology;  Laterality: N/A;  75 seeds implanted  no seeds found in bladder   Family History  Problem Relation Age of Onset   Hyperlipidemia Mother    Colon polyps Father    Colon cancer Brother 20   Cancer Brother 70       colon cancer now age 95   Heart disease Maternal Grandfather    Esophageal cancer Neg Hx    Rectal cancer Neg Hx    Stomach cancer Neg Hx    Social History   Socioeconomic History   Marital status: Married    Spouse name: Not on file   Number of children: 2   Years of education: Not on file   Highest education level: Not on file  Occupational History    Comment: advertising sales/ retired   Occupation: [employer]    Comment: Part time  Tobacco Use   Smoking status: Former    Packs/day: 0.25    Years: 10.00    Additional pack years: 0.00    Total pack years: 2.50    Types: Cigarettes    Quit date: 07/24/1984    Years since quitting: 37.8   Smokeless tobacco: Former    Types: Chew   Tobacco comments:    light smoker... quit 40 yrs ago  Substance and Sexual Activity   Alcohol use: Yes    Alcohol/week: 6.0 - 8.0 standard drinks of alcohol    Types: 6 - 8 Standard drinks or equivalent per week    Comment: beer on weekends   Drug use: No   Sexual activity: Yes    Partners: Female  Other Topics Concern   Not on file  Social History Narrative   Not on file   Social Determinants of Health   Financial Resource Strain: Low Risk  (06/05/2022)   Overall Financial Resource Strain (CARDIA)    Difficulty of Paying Living Expenses: Not hard at all  Food  Insecurity: No Food Insecurity (06/05/2022)   Hunger Vital Sign    Worried About Running Out of Food in the Last Year: Never true    Ran Out of Food in the Last Year: Never true  Transportation Needs: No Transportation Needs (06/03/2022)   PRAPARE - Administrator, Civil Service (Medical): No    Lack of Transportation (Non-Medical): No  Physical Activity: Sufficiently Active (06/05/2022)   Exercise Vital Sign    Days of  Exercise per Week: 6 days    Minutes of Exercise per Session: 40 min  Stress: No Stress Concern Present (06/05/2022)   [employer] of Occupational Health - Occupational Stress Questionnaire    Feeling of Stress : Only a little  Social Connections: Socially Integrated (06/05/2022)   Social Connection and Isolation Panel [NHANES]    Frequency of Communication with Friends and Family: More than three times a week    Frequency of Social Gatherings with Friends and Family: More than three times a week    Attends Religious Services: 1 to 4 times per year    Active Member of [employer] or Organizations: Yes    Attends Banker Meetings: Never    Marital Status: Married    Tobacco Counseling Counseling given: Not Answered Tobacco comments: light smoker... quit 40 yrs ago   Clinical Intake:  Pre-visit preparation completed: Yes  Pain : No/denies pain Pain Score: 0-No pain     BMI - recorded: 28.59 Nutritional Status: BMI 25 -29 Overweight Nutritional Risks: None Diabetes: No  How often do you need to have someone help you when you read instructions, pamphlets, or other written materials from your doctor or pharmacy?: 1 - Never What is the last grade level you completed in school?: Bachelor's Degree from [employer]  Diabetic? No  Interpreter Needed?: No  Information entered by :: Susie Cassette, LPN.   Activities of Daily Living    06/05/2022    9:59 AM 06/03/2022    8:17 AM  In your present state of health, do you have any  difficulty performing the following activities:  Hearing? 0 0  Vision? 0 0  Difficulty concentrating or making decisions? 0 0  Walking or climbing stairs? 0 0  Dressing or bathing? 0 0  Doing errands, shopping? 0 0  Preparing Food and eating ? N N  Using the Toilet? N N  In the past six months, have you accidently leaked urine? Y Y  Do you have problems with loss of bowel control? N N  Managing your Medications? N N  Managing your Finances? N N  Housekeeping or managing your Housekeeping? N N    Patient Care Team: Georgina Quint, MD as PCP - General (Internal Medicine) Glyn Ade, PA-C as Physician Assistant (Dermatology) Donna Christen, OD as Consulting Physician (Optometry)  Indicate any recent Medical Services you may have received from other than [facility] providers in the past year (date may be approximate).     Assessment:   This is a routine wellness examination for Allen Burnett.  Hearing/Vision screen Hearing Screening - Comments:: Denies hearing difficulties   Vision Screening - Comments:: Wears rx glasses - up to date with routine eye exams with [facility]   Dietary issues and exercise activities discussed: Current Exercise Habits: Home exercise routine, Type of exercise: walking;treadmill;stretching;strength training/weights, Time (Minutes): 40, Frequency (Times/Week): 6, Weekly Exercise (Minutes/Week): 240, Exercise limited by: cardiac condition(s)   Goals Addressed             This Visit's Progress    My goal for 2024 is to maintain my health, stay active and enjoy life to the fullest.        Depression Screen    06/05/2022    9:56 AM 04/24/2022   10:51 AM 10/12/2021    3:33 PM 07/01/2021   10:52 AM 07/01/2021   10:42 AM 05/12/2021    2:28 PM 04/19/2021    9:46 AM  PHQ 2/9 Scores  PHQ - 2 Score 0 0 0 0 0 0 0    Fall Risk    06/05/2022    9:54 AM 06/03/2022    8:17 AM 04/24/2022   10:51 AM 10/12/2021    3:32 PM 07/01/2021   10:42 AM  Fall Risk    Falls in the past year? 0 0 0 0 0  Number falls in past yr: 0  0 0   Injury with Fall? 0 0 0 0   Risk for fall due to : No Fall Risks  No Fall Risks No Fall Risks   Follow up Falls prevention discussed  Falls evaluation completed Falls evaluation completed     FALL RISK PREVENTION PERTAINING TO THE HOME:  Any stairs in or around the home? Yes  If so, are there any without handrails? No  Home free of loose throw rugs in walkways, pet beds, electrical cords, etc? Yes  Adequate lighting in your home to reduce risk of falls? Yes   ASSISTIVE DEVICES UTILIZED TO PREVENT FALLS:  Life alert? No  Use of a cane, walker or w/c? No  Grab bars in the bathroom? No  Shower chair or bench in shower? No  Elevated toilet seat or a handicapped toilet? No   TIMED UP AND GO:  Was the test performed? No . Telephonic Visit  Cognitive Function:        06/05/2022   10:00 AM 07/01/2021   10:54 AM 04/09/2018    8:33 AM  6CIT Screen  What Year? 0 points 0 points 0 points  What month? 0 points 0 points 0 points  What time? 0 points 0 points 0 points  Count back from 20 0 points 0 points 0 points  Months in reverse 0 points 0 points 0 points  Repeat phrase 0 points 0 points 0 points  Total Score 0 points 0 points 0 points    Immunizations Immunization History  Administered Date(s) Administered   Fluad Quad(high Dose 65+) 10/12/2021   Influenza, High Dose Seasonal PF 10/20/2018   Influenza-Unspecified 12/06/2015, 12/13/2016   Pneumococcal Conjugate-13 10/29/2013   Pneumococcal Polysaccharide-23 03/15/2016    TDAP status: Up to date  Flu Vaccine status: Up to date  Pneumococcal vaccine status: Due, Education has been provided regarding the importance of this vaccine. Advised may receive this vaccine at local pharmacy or Health Dept. Aware to provide a copy of the vaccination record if obtained from local pharmacy or Health Dept. Verbalized acceptance and understanding.  Covid-19 vaccine  status: Declined, Education has been provided regarding the importance of this vaccine but patient still declined. Advised may receive this vaccine at local pharmacy or Health Dept.or vaccine clinic. Aware to provide a copy of the vaccination record if obtained from local pharmacy or Health Dept. Verbalized acceptance and understanding.  Qualifies for Shingles Vaccine? Yes   Zostavax completed No   Shingrix Completed?: No.    Education has been provided regarding the importance of this vaccine. Patient has been advised to call insurance company to determine out of pocket expense if they have not yet received this vaccine. Advised may also receive vaccine at local pharmacy or Health Dept. Verbalized acceptance and understanding.  Screening Tests Health Maintenance  Topic Date Due   COVID-19 Vaccine (1) Never done   Zoster Vaccines- Shingrix (1 of 2) 09/24/2022 (Originally 03/29/1969)   Pneumonia Vaccine 43+ Years old (3 of 3 - PPSV23 or PCV20) 10/13/2022 (Originally 03/15/2021)   INFLUENZA VACCINE  09/07/2022   Medicare  Annual Wellness (AWV)  06/05/2023   COLONOSCOPY (Pts 45-15yrs Insurance coverage will need to be confirmed)  08/29/2024   Hepatitis C Screening  Completed   HPV VACCINES  Aged Out   DTaP/Tdap/Td  Discontinued    Health Maintenance  Health Maintenance Due  Topic Date Due   COVID-19 Vaccine (1) Never done    Colorectal cancer screening: Type of screening: Colonoscopy. Completed 08/29/2021. Repeat every 3 years  Lung Cancer Screening: (Low Dose CT Chest recommended if Age 55-80 years, 30 pack-year currently smoking OR have quit w/in 15years.) does not qualify.   Lung Cancer Screening Referral: no  Additional Screening:  Hepatitis C Screening: does qualify; Completed 01/20/2015  Vision Screening: Recommended annual ophthalmology exams for early detection of glaucoma and other disorders of the eye. Is the patient up to date with their annual eye exam?  Yes  Who is the  provider or what is the name of the office in which the patient attends annual eye exams? Stacy Hutto, OD. If pt is not established with a provider, would they like to be referred to a provider to establish care? No .   Dental Screening: Recommended annual dental exams for proper oral hygiene  Community Resource Referral / Chronic Care Management: CRR required this visit?  No   CCM required this visit?  No      Plan:     I have personally reviewed and noted the following in the patient's chart:   Medical and social history Use of alcohol, tobacco or illicit drugs  Current medications and supplements including opioid prescriptions. Patient is not currently taking opioid prescriptions. Functional ability and status Nutritional status Physical activity Advanced directives List of other physicians Hospitalizations, surgeries, and ER visits in previous 12 months Vitals Screenings to include cognitive, depression, and falls Referrals and appointments  In addition, I have reviewed and discussed with patient certain preventive protocols, quality metrics, and best practice recommendations. A written personalized care plan for preventive services as well as general preventive health recommendations were provided to patient.     Mickeal Needy, LPN   1/61/0960   Nurse Notes:  Normal cognitive status assessed by direct observation via phone conversation by this Nurse Health Advisor. No abnormalities found.

## 2022-06-05 NOTE — Patient Instructions (Addendum)
Allen Burnett , Thank you for taking time to come for your Medicare Wellness Visit. I appreciate your ongoing commitment to your health goals. Please review the following plan we discussed and let me know if I can assist you in the future.   These are the goals we discussed:  Goals      My goal for 2024 is to maintain my health, stay active and enjoy life to the fullest.        This is a list of the screening recommended for you and due dates:  Health Maintenance  Topic Date Due   COVID-19 Vaccine (1) Never done   Zoster (Shingles) Vaccine (1 of 2) 09/24/2022*   Pneumonia Vaccine (3 of 3 - PPSV23 or PCV20) 10/13/2022*   Flu Shot  09/07/2022   Medicare Annual Wellness Visit  06/05/2023   Colon Cancer Screening  08/29/2024   Hepatitis C Screening: USPSTF Recommendation to screen - Ages 18-79 yo.  Completed   HPV Vaccine  Aged Out   DTaP/Tdap/Td vaccine  Discontinued  *Topic was postponed. The date shown is not the original due date.    Advanced directives: Yes; Patient has a Living Will.  Conditions/risks identified: Yes.  Next appointment: Follow up in one year for your annual wellness visit.   Preventive Care 1 Years and Older, Male  Preventive care refers to lifestyle choices and visits with your health care provider that can promote health and wellness. What does preventive care include? A yearly physical exam. This is also called an annual well check. Dental exams once or twice a year. Routine eye exams. Ask your health care provider how often you should have your eyes checked. Personal lifestyle choices, including: Daily care of your teeth and gums. Regular physical activity. Eating a healthy diet. Avoiding tobacco and drug use. Limiting alcohol use. Practicing safe sex. Taking low doses of aspirin every day. Taking vitamin and mineral supplements as recommended by your health care provider. What happens during an annual well check? The services and screenings done by  your health care provider during your annual well check will depend on your age, overall health, lifestyle risk factors, and family history of disease. Counseling  Your health care provider may ask you questions about your: Alcohol use. Tobacco use. Drug use. Emotional well-being. Home and relationship well-being. Sexual activity. Eating habits. History of falls. Memory and ability to understand (cognition). Work and work Astronomer. Screening  You may have the following tests or measurements: Height, weight, and BMI. Blood pressure. Lipid and cholesterol levels. These may be checked every 5 years, or more frequently if you are over 81 years old. Skin check. Lung cancer screening. You may have this screening every year starting at age 67 if you have a 30-pack-year history of smoking and currently smoke or have quit within the past 15 years. Fecal occult blood test (FOBT) of the stool. You may have this test every year starting at age 19. Flexible sigmoidoscopy or colonoscopy. You may have a sigmoidoscopy every 5 years or a colonoscopy every 10 years starting at age 36. Prostate cancer screening. Recommendations will vary depending on your family history and other risks. Hepatitis C blood test. Hepatitis B blood test. Sexually transmitted disease (STD) testing. Diabetes screening. This is done by checking your blood sugar (glucose) after you have not eaten for a while (fasting). You may have this done every 1-3 years. Abdominal aortic aneurysm (AAA) screening. You may need this if you are a current or former  smoker. Osteoporosis. You may be screened starting at age 53 if you are at high risk. Talk with your health care provider about your test results, treatment options, and if necessary, the need for more tests. Vaccines  Your health care provider may recommend certain vaccines, such as: Influenza vaccine. This is recommended every year. Tetanus, diphtheria, and acellular pertussis  (Tdap, Td) vaccine. You may need a Td booster every 10 years. Zoster vaccine. You may need this after age 61. Pneumococcal 13-valent conjugate (PCV13) vaccine. One dose is recommended after age 79. Pneumococcal polysaccharide (PPSV23) vaccine. One dose is recommended after age 69. Talk to your health care provider about which screenings and vaccines you need and how often you need them. This information is not intended to replace advice given to you by your health care provider. Make sure you discuss any questions you have with your health care provider. Document Released: 02/19/2015 Document Revised: 10/13/2015 Document Reviewed: 11/24/2014 Elsevier Interactive Patient Education  2017 [place] Prevention in the Home Falls can cause injuries. They can happen to people of all ages. There are many things you can do to make your home safe and to help prevent falls. What can I do on the outside of my home? Regularly fix the edges of walkways and driveways and fix any cracks. Remove anything that might make you trip as you walk through a door, such as a raised step or threshold. Trim any bushes or trees on the path to your home. Use bright outdoor lighting. Clear any walking paths of anything that might make someone trip, such as rocks or tools. Regularly check to see if handrails are loose or broken. Make sure that both sides of any steps have handrails. Any raised decks and porches should have guardrails on the edges. Have any leaves, snow, or ice cleared regularly. Use sand or salt on walking paths during winter. Clean up any spills in your garage right away. This includes oil or grease spills. What can I do in the bathroom? Use night lights. Install grab bars by the toilet and in the tub and shower. Do not use towel bars as grab bars. Use non-skid mats or decals in the tub or shower. If you need to sit down in the shower, use a plastic, non-slip stool. Keep the floor dry. Clean  up any water that spills on the floor as soon as it happens. Remove soap buildup in the tub or shower regularly. Attach bath mats securely with double-sided non-slip rug tape. Do not have throw rugs and other things on the floor that can make you trip. What can I do in the bedroom? Use night lights. Make sure that you have a light by your bed that is easy to reach. Do not use any sheets or blankets that are too big for your bed. They should not hang down onto the floor. Have a firm chair that has side arms. You can use this for support while you get dressed. Do not have throw rugs and other things on the floor that can make you trip. What can I do in the kitchen? Clean up any spills right away. Avoid walking on wet floors. Keep items that you use a lot in easy-to-reach places. If you need to reach something above you, use a strong step stool that has a grab bar. Keep electrical cords out of the way. Do not use floor polish or wax that makes floors slippery. If you must use wax, use non-skid  floor wax. Do not have throw rugs and other things on the floor that can make you trip. What can I do with my stairs? Do not leave any items on the stairs. Make sure that there are handrails on both sides of the stairs and use them. Fix handrails that are broken or loose. Make sure that handrails are as long as the stairways. Check any carpeting to make sure that it is firmly attached to the stairs. Fix any carpet that is loose or worn. Avoid having throw rugs at the top or bottom of the stairs. If you do have throw rugs, attach them to the floor with carpet tape. Make sure that you have a light switch at the top of the stairs and the bottom of the stairs. If you do not have them, ask someone to add them for you. What else can I do to help prevent falls? Wear shoes that: Do not have high heels. Have rubber bottoms. Are comfortable and fit you well. Are closed at the toe. Do not wear sandals. If you  use a stepladder: Make sure that it is fully opened. Do not climb a closed stepladder. Make sure that both sides of the stepladder are locked into place. Ask someone to hold it for you, if possible. Clearly mark and make sure that you can see: Any grab bars or handrails. First and last steps. Where the edge of each step is. Use tools that help you move around (mobility aids) if they are needed. These include: Canes. Walkers. Scooters. Crutches. Turn on the lights when you go into a dark area. Replace any light bulbs as soon as they burn out. Set up your furniture so you have a clear path. Avoid moving your furniture around. If any of your floors are uneven, fix them. If there are any pets around you, be aware of where they are. Review your medicines with your doctor. Some medicines can make you feel dizzy. This can increase your chance of falling. Ask your doctor what other things that you can do to help prevent falls. This information is not intended to replace advice given to you by your health care provider. Make sure you discuss any questions you have with your health care provider. Document Released: 11/19/2008 Document Revised: 07/01/2015 Document Reviewed: 02/27/2014 Elsevier Interactive Patient Education  2017 [employer].

## 2022-06-23 DIAGNOSIS — Z85828 Personal history of other malignant neoplasm of skin: Secondary | ICD-10-CM | POA: Diagnosis not present

## 2022-06-23 DIAGNOSIS — K219 Gastro-esophageal reflux disease without esophagitis: Secondary | ICD-10-CM | POA: Diagnosis not present

## 2022-06-23 DIAGNOSIS — M1990 Unspecified osteoarthritis, unspecified site: Secondary | ICD-10-CM | POA: Diagnosis not present

## 2022-06-23 DIAGNOSIS — Z811 Family history of alcohol abuse and dependence: Secondary | ICD-10-CM | POA: Diagnosis not present

## 2022-06-23 DIAGNOSIS — M4800 Spinal stenosis, site unspecified: Secondary | ICD-10-CM | POA: Diagnosis not present

## 2022-06-23 DIAGNOSIS — R32 Unspecified urinary incontinence: Secondary | ICD-10-CM | POA: Diagnosis not present

## 2022-06-23 DIAGNOSIS — Z791 Long term (current) use of non-steroidal anti-inflammatories (NSAID): Secondary | ICD-10-CM | POA: Diagnosis not present

## 2022-06-23 DIAGNOSIS — I129 Hypertensive chronic kidney disease with stage 1 through stage 4 chronic kidney disease, or unspecified chronic kidney disease: Secondary | ICD-10-CM | POA: Diagnosis not present

## 2022-06-23 DIAGNOSIS — N182 Chronic kidney disease, stage 2 (mild): Secondary | ICD-10-CM | POA: Diagnosis not present

## 2022-06-23 DIAGNOSIS — Z87891 Personal history of nicotine dependence: Secondary | ICD-10-CM | POA: Diagnosis not present

## 2022-06-23 DIAGNOSIS — Z8249 Family history of ischemic heart disease and other diseases of the circulatory system: Secondary | ICD-10-CM | POA: Diagnosis not present

## 2022-06-23 DIAGNOSIS — E785 Hyperlipidemia, unspecified: Secondary | ICD-10-CM | POA: Diagnosis not present

## 2022-06-23 DIAGNOSIS — Z008 Encounter for other general examination: Secondary | ICD-10-CM | POA: Diagnosis not present

## 2022-06-26 ENCOUNTER — Encounter: Payer: Self-pay | Admitting: Emergency Medicine

## 2022-06-26 ENCOUNTER — Other Ambulatory Visit: Payer: Self-pay | Admitting: *Deleted

## 2022-06-26 DIAGNOSIS — Z136 Encounter for screening for cardiovascular disorders: Secondary | ICD-10-CM

## 2022-06-26 DIAGNOSIS — E785 Hyperlipidemia, unspecified: Secondary | ICD-10-CM

## 2022-06-26 NOTE — Telephone Encounter (Signed)
Good idea to get it done.

## 2022-06-30 ENCOUNTER — Other Ambulatory Visit: Payer: Self-pay | Admitting: Internal Medicine

## 2022-06-30 DIAGNOSIS — Z136 Encounter for screening for cardiovascular disorders: Secondary | ICD-10-CM

## 2022-06-30 DIAGNOSIS — E785 Hyperlipidemia, unspecified: Secondary | ICD-10-CM

## 2022-06-30 NOTE — Telephone Encounter (Signed)
Please advise on dx for Ct cardiac scoring order

## 2022-07-18 ENCOUNTER — Other Ambulatory Visit: Payer: Self-pay | Admitting: Emergency Medicine

## 2022-07-18 DIAGNOSIS — G4700 Insomnia, unspecified: Secondary | ICD-10-CM

## 2022-07-31 ENCOUNTER — Ambulatory Visit
Admission: RE | Admit: 2022-07-31 | Discharge: 2022-07-31 | Disposition: A | Discharge status: Home / Self Care | Payer: Medicare HMO | Source: Ambulatory Visit | Attending: Internal Medicine | Admitting: Internal Medicine

## 2022-07-31 DIAGNOSIS — Z136 Encounter for screening for cardiovascular disorders: Secondary | ICD-10-CM

## 2022-07-31 DIAGNOSIS — E785 Hyperlipidemia, unspecified: Secondary | ICD-10-CM

## 2022-08-31 ENCOUNTER — Encounter (HOSPITAL_BASED_OUTPATIENT_CLINIC_OR_DEPARTMENT_OTHER): Payer: Self-pay

## 2022-08-31 ENCOUNTER — Emergency Department (HOSPITAL_BASED_OUTPATIENT_CLINIC_OR_DEPARTMENT_OTHER)
Admission: EM | Admit: 2022-08-31 | Discharge: 2022-08-31 | Disposition: A | Discharge status: Home / Self Care | Payer: Medicare HMO | Attending: Emergency Medicine | Admitting: Emergency Medicine

## 2022-08-31 ENCOUNTER — Emergency Department (HOSPITAL_BASED_OUTPATIENT_CLINIC_OR_DEPARTMENT_OTHER): Payer: Medicare HMO

## 2022-08-31 ENCOUNTER — Other Ambulatory Visit: Payer: Self-pay

## 2022-08-31 ENCOUNTER — Other Ambulatory Visit (HOSPITAL_BASED_OUTPATIENT_CLINIC_OR_DEPARTMENT_OTHER): Payer: Self-pay

## 2022-08-31 DIAGNOSIS — R112 Nausea with vomiting, unspecified: Secondary | ICD-10-CM | POA: Insufficient documentation

## 2022-08-31 DIAGNOSIS — R42 Dizziness and giddiness: Secondary | ICD-10-CM | POA: Insufficient documentation

## 2022-08-31 DIAGNOSIS — R930 Abnormal findings on diagnostic imaging of skull and head, not elsewhere classified: Secondary | ICD-10-CM | POA: Diagnosis not present

## 2022-08-31 DIAGNOSIS — R1110 Vomiting, unspecified: Secondary | ICD-10-CM | POA: Diagnosis not present

## 2022-08-31 DIAGNOSIS — I6529 Occlusion and stenosis of unspecified carotid artery: Secondary | ICD-10-CM | POA: Diagnosis not present

## 2022-08-31 DIAGNOSIS — R29818 Other symptoms and signs involving the nervous system: Secondary | ICD-10-CM | POA: Diagnosis not present

## 2022-08-31 DIAGNOSIS — H81399 Other peripheral vertigo, unspecified ear: Secondary | ICD-10-CM | POA: Diagnosis not present

## 2022-08-31 DIAGNOSIS — R9089 Other abnormal findings on diagnostic imaging of central nervous system: Secondary | ICD-10-CM | POA: Diagnosis not present

## 2022-08-31 LAB — CBC
HCT: 40.8 % (ref 39.0–52.0)
Hemoglobin: 14.2 g/dL (ref 13.0–17.0)
MCH: 32.2 pg (ref 26.0–34.0)
MCHC: 34.8 g/dL (ref 30.0–36.0)
MCV: 92.5 fL (ref 80.0–100.0)
Platelets: 144 10*3/uL — ABNORMAL LOW (ref 150–400)
RBC: 4.41 MIL/uL (ref 4.22–5.81)
RDW: 12.4 % (ref 11.5–15.5)
WBC: 6 10*3/uL (ref 4.0–10.5)
nRBC: 0 % (ref 0.0–0.2)

## 2022-08-31 LAB — COMPREHENSIVE METABOLIC PANEL
ALT: 24 U/L (ref 0–44)
AST: 20 U/L (ref 15–41)
Albumin: 4.4 g/dL (ref 3.5–5.0)
Alkaline Phosphatase: 46 U/L (ref 38–126)
Anion gap: 9 (ref 5–15)
BUN: 27 mg/dL — ABNORMAL HIGH (ref 8–23)
CO2: 23 mmol/L (ref 22–32)
Calcium: 8.8 mg/dL — ABNORMAL LOW (ref 8.9–10.3)
Chloride: 108 mmol/L (ref 98–111)
Creatinine, Ser: 1.04 mg/dL (ref 0.61–1.24)
GFR, Estimated: >60 mL/min (ref >60)
Glucose, Bld: 113 mg/dL — ABNORMAL HIGH (ref 70–99)
Potassium: 4.1 mmol/L (ref 3.5–5.1)
Sodium: 140 mmol/L (ref 135–145)
Total Bilirubin: 0.5 mg/dL (ref 0.3–1.2)
Total Protein: 7.1 g/dL (ref 6.5–8.1)

## 2022-08-31 LAB — LIPASE, BLOOD: Lipase: 12 U/L (ref 11–51)

## 2022-08-31 MED ORDER — MECLIZINE HCL 25 MG PO TABS
25.0000 mg | ORAL_TABLET | Freq: Three times a day (TID) | ORAL | 0 refills | Status: DC | PRN
  Filled 2022-08-31: qty 30, 10d supply, fill #0

## 2022-08-31 MED ORDER — ONDANSETRON HCL 4 MG/2ML IJ SOLN
4.0000 mg | Freq: Once | INTRAMUSCULAR | Status: AC
  Administered 2022-08-31: 4 mg via INTRAVENOUS
  Filled 2022-08-31: qty 2

## 2022-08-31 MED ORDER — IOHEXOL 350 MG/ML SOLN
100.0000 mL | Freq: Once | INTRAVENOUS | Status: AC | PRN
  Administered 2022-08-31: 75 mL via INTRAVENOUS

## 2022-08-31 MED ORDER — LORAZEPAM 2 MG/ML IJ SOLN
1.0000 mg | Freq: Once | INTRAMUSCULAR | Status: AC
  Administered 2022-08-31: 1 mg via INTRAVENOUS
  Filled 2022-08-31: qty 1

## 2022-08-31 MED ORDER — DIAZEPAM 5 MG PO TABS
5.0000 mg | ORAL_TABLET | Freq: Once | ORAL | Status: AC
  Administered 2022-08-31: 5 mg via ORAL
  Filled 2022-08-31: qty 1

## 2022-08-31 MED ORDER — MECLIZINE HCL 25 MG PO TABS
25.0000 mg | ORAL_TABLET | Freq: Once | ORAL | Status: AC
  Administered 2022-08-31: 25 mg via ORAL
  Filled 2022-08-31: qty 1

## 2022-08-31 MED ORDER — LACTATED RINGERS IV BOLUS
1000.0000 mL | Freq: Once | INTRAVENOUS | Status: AC
  Administered 2022-08-31: 1000 mL via INTRAVENOUS

## 2022-08-31 NOTE — ED Triage Notes (Signed)
POV from home, A&O x 4, GCS 15, BIB wheelchair, able to ambulate  Pt sts that this AM he woke up approx 5am and felt dizzy and began vomiting, no other associated symptoms or acute pain.

## 2022-08-31 NOTE — Discharge Instructions (Addendum)
You are seen in the emergency room for dizziness.  MRI of the brain is negative for stroke.  Start taking Antivert that is prescribed to help with the symptoms. Please perform the exercises that are provided to help with your symptoms.  If your symptoms do not improve over the next 2 days, then call the neurologist at the number provided to set up an appointment.  Please be extremely careful at this time to prevent any falls.

## 2022-08-31 NOTE — ED Provider Notes (Signed)
[name] Note   CSN: [id number] Arrival date & time: 08/31/22  2130     History  Chief Complaint  Patient presents with   Emesis    Allen Burnett is a 72 y.o. male.  HPI    72 year old male with history of hyperlipidemia comes in with chief complaint of dizziness and vomiting.  Patient states that he woke up this morning at 5 AM and had dizziness and vomiting.  Dizziness is described as lightheadedness, spinning sensation.  Symptoms are present even at rest, but improved compared to when patient is trying to walk.  He also has constant nausea and sick to his stomach feeling.  Patient denies any associated double vision, slurred speech, difficulty in swallowing, one-sided weakness or numbness.  No previous history of similar symptoms.  Patient denies any trauma.  Patient denies any substance use disorder, but does drink 2-4 beers regularly.  Home Medications Prior to Admission medications   Medication Sig Start Date End Date Taking? Authorizing Provider  meclizine (ANTIVERT) 25 MG tablet Take 1 tablet (25 mg total) by mouth 3 (three) times daily as needed for dizziness. 08/31/22  Yes Derwood Kaplan, MD  diclofenac (VOLTAREN) 75 MG EC tablet Take 75 mg by mouth 2 (two) times daily. 03/23/22   [provider]  ibuprofen (ADVIL) 600 MG tablet Take 1 tablet (600 mg total) by mouth every 8 (eight) hours as needed. 05/12/21   Elenore Paddy, NP  Multiple Vitamin (MULTIVITAMIN WITH MINERALS) TABS tablet Take 1 tablet by mouth daily.    [provider]  rosuvastatin (CRESTOR) 10 MG tablet Take 1 tablet (10 mg total) by mouth daily. 04/24/22   Georgina Quint, MD  zolpidem (AMBIEN) 10 MG tablet TAKE 1 TABLET BY MOUTH EVERY NIGHT AT BEDTIME AS NEEDED FOR SLEEP 07/19/22   Georgina Quint, MD      Allergies    Patient has no known allergies.    Review of Systems   Review of Systems  All other systems reviewed  and are negative.   Physical Exam Updated Vital Signs BP (!) 163/86   Pulse (!) 52   Temp 98.1 F (36.7 C) (Oral)   Resp 19   Ht 5\' 11"  (1.803 m)   Wt 93 kg   SpO2 98%   BMI 28.59 kg/m  Physical Exam Vitals and nursing note reviewed.  Constitutional:      Appearance: He is well-developed.  HENT:     Head: Atraumatic.  Eyes:     Extraocular Movements: Extraocular movements intact.     Pupils: Pupils are equal, round, and reactive to light.     Comments: No nystagmus  Cardiovascular:     Rate and Rhythm: Normal rate.  Pulmonary:     Effort: Pulmonary effort is normal.  Musculoskeletal:     Cervical back: Neck supple.  Skin:    General: Skin is warm.  Neurological:     Mental Status: He is alert and oriented to person, place, and time.     Cranial Nerves: No cranial nerve deficit.     Sensory: No sensory deficit.     Motor: No weakness.     Coordination: Coordination normal.     ED Results / Procedures / Treatments   Labs (all labs ordered are listed, but only abnormal results are displayed) Labs Reviewed  COMPREHENSIVE METABOLIC PANEL - Abnormal; Notable for the following components:      Result Value  Glucose, Bld 113 (*)    BUN 27 (*)    Calcium 8.8 (*)    All other components within normal limits  CBC - Abnormal; Notable for the following components:   Platelets 144 (*)    All other components within normal limits  LIPASE, BLOOD  URINALYSIS, ROUTINE W REFLEX MICROSCOPIC    EKG None  Radiology MR BRAIN WO CONTRAST  Result Date: 08/31/2022 CLINICAL DATA:  Neuro deficit, acute, stroke suspected. EXAM: MRI HEAD WITHOUT CONTRAST TECHNIQUE: Multiplanar, multiecho pulse sequences of the brain and surrounding structures were obtained without intravenous contrast. COMPARISON:  CTA head/neck 08/31/2022. FINDINGS: Brain: No acute infarct or hemorrhage. No mass or midline shift. Generalized volume loss within expected range for age. No acute hydrocephalus or  extra-axial collection. No abnormal susceptibility. Vascular: Normal flow voids. Skull and upper cervical spine: Normal marrow signal. Sinuses/Orbits: Negative. Other: None. IMPRESSION: Normal for age brain MRI. Electronically Signed   By: Orvan Falconer M.D.   On: 08/31/2022 12:28   CT ANGIO HEAD NECK W WO CM  Result Date: 08/31/2022 CLINICAL DATA:  Provided history: Vertigo, central. Additional history provided: Dizziness, vomiting. EXAM: CT ANGIOGRAPHY HEAD AND NECK WITH AND WITHOUT CONTRAST TECHNIQUE: Multidetector CT imaging of the head and neck was performed using the standard protocol during bolus administration of intravenous contrast. Multiplanar CT image reconstructions and MIPs were obtained to evaluate the vascular anatomy. Carotid stenosis measurements (when applicable) are obtained utilizing NASCET criteria, using the distal internal carotid diameter as the denominator. RADIATION DOSE REDUCTION: This exam was performed according to the departmental dose-optimization program which includes automated exposure control, adjustment of the mA and/or kV according to patient size and/or use of iterative reconstruction technique. CONTRAST:  75mL OMNIPAQUE IOHEXOL 350 MG/ML SOLN COMPARISON:  None. FINDINGS: CT HEAD FINDINGS Brain: There is no acute intracranial hemorrhage. No demarcated cortical infarct. No extra-axial fluid collection. No evidence of an intracranial mass. No midline shift. Vascular: No hyperdense vessel. Skull: No calvarial fracture or aggressive osseous lesion. Sinuses/Orbits: No orbital mass or acute orbital finding. No significant paranasal sinus disease. Review of the MIP images confirms the above findings CTA NECK FINDINGS Aortic arch: Standard aortic branching. The visualized thoracic aorta is normal in caliber. Minimal atherosclerotic plaque at the origin of the right subclavian artery. No hemodynamically significant innominate or proximal subclavian artery stenosis. Right carotid  system: CCA and ICA patent within the neck without stenosis. Mild atherosclerotic plaque about the carotid bifurcation and within the proximal ICA. Left carotid system: CCA and ICA patent within the neck without stenosis. Mild atherosclerotic plaque about the carotid bifurcation and within the proximal ICA. Vertebral arteries: The vertebral arteries are codominant and patent within the neck without stenosis or significant atherosclerotic disease. Skeleton: Cervical spondylosis. No acute fracture or aggressive osseous lesion. Other neck: No neck mass or cervical lymphadenopathy. Upper chest: No consolidation within the imaged lung apices. Review of the MIP images confirms the above findings CTA HEAD FINDINGS Anterior circulation: The intracranial internal carotid arteries are patent. The M1 middle cerebral arteries are patent. No M2 proximal branch occlusion or high-grade proximal stenosis. The anterior cerebral arteries are patent. No intracranial aneurysm is identified. Posterior circulation: The intracranial vertebral arteries are patent. The left PICA appears to arise from the left vertebral artery distal V2 segment. The right PICA is poorly delineated. The basilar artery is patent. The posterior cerebral arteries are patent. Posterior communicating arteries are diminutive or absent, bilaterally. Venous sinuses: Within the limitations of contrast timing,  no convincing thrombus. Anatomic variants: As described. Review of the MIP images confirms the above findings CTA head impression #1 called by telephone at the time of interpretation on 08/31/2022 at 9:35 am to provider [facility] , who verbally acknowledged these results. IMPRESSION: CT head: No evidence of an acute intracranial abnormality. CTA neck: 1. The common carotid and internal carotid arteries are patent within the neck without stenosis. Mild atherosclerotic plaque bilaterally. 2. The vertebral arteries are patent within the neck without stenosis or  significant atherosclerotic disease. CTA head: 1. The right posterior inferior cerebellar artery (PICA) is poorly delineated. This vessel may be developmentally diminutive. However, vessel occlusion cannot be excluded. Given the provided history of dizziness and vomiting, consider brain MRI to exclude an acute right PICA territory infarct. 2. Elsewhere, no intracranial large vessel occlusion or proximal high-grade arterial stenosis is identified. Electronically Signed   By: Jackey Loge D.O.   On: 08/31/2022 09:38    Procedures Procedures    Medications Ordered in ED Medications  meclizine (ANTIVERT) tablet 25 mg (has no administration in time range)  ondansetron (ZOFRAN) injection 4 mg (4 mg Intravenous Given 08/31/22 0640)  diazepam (VALIUM) tablet 5 mg (5 mg Oral Given 08/31/22 0718)  lactated ringers bolus 1,000 mL (0 mLs Intravenous Stopped 08/31/22 0844)  meclizine (ANTIVERT) tablet 25 mg (25 mg Oral Given 08/31/22 0844)  iohexol (OMNIPAQUE) 350 MG/ML injection 100 mL (75 mLs Intravenous Contrast Given 08/31/22 0849)  LORazepam (ATIVAN) injection 1 mg (1 mg Intravenous Given 08/31/22 0911)  LORazepam (ATIVAN) injection 1 mg (1 mg Intravenous Given 08/31/22 1119)    ED Course/ Medical Decision Making/ A&P Clinical Course as of 08/31/22 1242  Thu Aug 31, 2022  0900 Pt reassessed post valium. Still feels dizzy. Valium did not help at all.  We will proceed with CT angiogram head and neck.  I did recommend that patient get MRI, but he has declined for now. [AN]  1051 CT ANGIO HEAD NECK W WO CM CT angiogram independently interpreted.  I did receive a phone call from radiology as well.  CT scan negative for brain bleed.  Per radiology, there is some concerns w/ PICA flow.  With this finding, patient has agreed to getting MRI.  Ativan ordered for the MRI.  [AN]    Clinical Course User Index [AN] Derwood Kaplan, MD                             Medical Decision Making Amount and/or Complexity of  Data Reviewed Labs: ordered. Radiology: ordered. Decision-making details documented in ED Course.  Risk Prescription drug management.   This patient presents to the ED with chief complaint(s) of dizziness, nausea with pertinent past medical history of HL .The complaint involves an extensive differential diagnosis and also carries with it a high risk of complications and morbidity.    The differential diagnosis includes : DDx includes: Central vertigo:  Tumor  Stroke  ICH  Vertebrobasilar TIA  Peripheral Vertigo:  BPPV  Vestibular neuritis  Meniere disease  Migrainous vertigo  Ear Infection   The initial plan is to treat this as peripheral vertigo. Pt will get valium and we will reassess.   Independent labs interpretation:  The following labs were independently interpreted: CBC and BMP are reassuring  Independent visualization and interpretation of imaging: - I independently visualized the following imaging with scope of interpretation limited to determining acute life threatening conditions related to emergency  care: CT angiogram head and neck, which revealed no evidence of brain bleed  Treatment and Reassessment: See workup tab  12:42 PM MRI of the brain is negative.  Patient indicates that he feels a lot better.  He wants to eat something now.  Results discussed with him.  Informed him that he most likely has peripheral vertigo.  Patient has ambulated.    Final Clinical Impression(s) / ED Diagnoses Final diagnoses:  Peripheral vertigo, unspecified laterality    Rx / DC Orders ED Discharge Orders          Ordered    meclizine (ANTIVERT) 25 MG tablet  3 times daily PRN        08/31/22 1240              Derwood Kaplan, MD 08/31/22 1247

## 2022-09-05 ENCOUNTER — Telehealth: Payer: Self-pay | Admitting: *Deleted

## 2022-09-05 NOTE — Telephone Encounter (Signed)
Transition Care Management Follow-up Telephone Call Date of discharge and from where: [facility] 09/01/2022 How have you been since you were released from the hospital? Feeling good  Any questions or concerns? No  Items Reviewed: Did the pt receive and understand the discharge instructions provided? Yes  Medications obtained and verified? No  Other? No  Any new allergies since your discharge? No  Dietary orders reviewed? No Do you have support at home? Yes        Follow up appointments reviewed:  PCP Hospital f/u appt confirmed? No  Does not feel the need  . Are transportation arrangements needed? No  If their condition worsens, is the pt aware to call PCP or go to the Emergency Dept.? Yes Was the patient provided with contact information for the PCP's office or ED? Yes Was to pt encouraged to call back with questions or concerns? Yes

## 2022-09-18 ENCOUNTER — Encounter: Payer: Self-pay | Admitting: Emergency Medicine

## 2022-09-19 ENCOUNTER — Other Ambulatory Visit: Payer: Self-pay | Admitting: Emergency Medicine

## 2022-09-19 DIAGNOSIS — G4700 Insomnia, unspecified: Secondary | ICD-10-CM

## 2022-09-19 NOTE — Telephone Encounter (Signed)
Will discuss at next week's appointment.  Thanks.

## 2022-09-26 ENCOUNTER — Ambulatory Visit (INDEPENDENT_AMBULATORY_CARE_PROVIDER_SITE_OTHER): Payer: Medicare HMO | Admitting: Emergency Medicine

## 2022-09-26 ENCOUNTER — Encounter: Payer: Self-pay | Admitting: Emergency Medicine

## 2022-09-26 VITALS — BP 118/74 | HR 67 | Temp 97.9°F | Ht 71.0 in | Wt 206.4 lb

## 2022-09-26 DIAGNOSIS — M47892 Other spondylosis, cervical region: Secondary | ICD-10-CM | POA: Diagnosis not present

## 2022-09-26 DIAGNOSIS — G8929 Other chronic pain: Secondary | ICD-10-CM | POA: Diagnosis not present

## 2022-09-26 DIAGNOSIS — M5090 Cervical disc disorder, unspecified, unspecified cervical region: Secondary | ICD-10-CM

## 2022-09-26 NOTE — Assessment & Plan Note (Signed)
Significant diffuse disease seen in recent cervical spine MRI Needs neurosurgical evaluation Has appointment for today.

## 2022-09-26 NOTE — Assessment & Plan Note (Signed)
Secondary to cervical disc disease Chronic and affecting quality of life.  Has daily pain. Pain management discussed. Taking diclofenac 75 mg twice a day as needed Recent cervical spine MRI report reviewed Has appointment with neurosurgeon today at 2:30 PM

## 2022-09-26 NOTE — Patient Instructions (Signed)
Degenerative Disk Disease  Degenerative disk disease is a condition caused by changes that occur in the spinal disks as a person ages. Spinal disks are soft and compressible disks located between the bones of your spine (vertebrae). These disks act like shock absorbers. Degenerative disk disease can affect the whole spine. However, the neck and lower back are most often affected. Many changes can occur in the spinal disks with aging, such as: The spinal disks may dry and shrink. Small tears may occur in the tough, outer covering of the disk (annulus). The disk space may become smaller due to loss of water. Abnormal growths in the bone (spurs) may occur. This can put pressure on the nerve roots exiting the spinal canal, causing pain. The spinal canal may become narrowed. What are the causes? This condition may be caused by: Normal degeneration with age. Injuries. Certain activities and sports that cause damage. What increases the risk? The following factors may make you more likely to develop this condition: Being overweight. Having a family history of degenerative disk disease. Smoking and use of products that contain nicotine and tobacco. Sudden injury. Doing work that requires heavy lifting. What are the signs or symptoms? Symptoms of this condition include: Pain that varies in intensity. Some people have no pain, while others have severe pain. The location of the pain depends on the part of your backbone that is affected. You may have: Pain in your neck or arm if a disk in your neck area is affected. Pain in your back, buttocks, or legs if a disk in your lower back is affected. Pain that becomes worse while bending or reaching up, or with twisting movements. Pain that may start gradually and worsen as time passes. It may also start after a major or minor injury. Numbness or tingling in the arms or legs. How is this diagnosed? This condition may be diagnosed based on: Your symptoms  and medical history. A physical exam. Imaging tests, including: X-ray of the spine. CT scan. MRI. How is this treated? This condition may be treated with: Medicines. Injection of steroids into the back. Rehabilitation exercises. These activities aim to strengthen muscles in your back and abdomen to better support your spine. If treatments do not help to relieve your symptoms or you have severe pain, you may need surgery. Follow these instructions at home: Medicines Take over-the-counter and prescription medicines only as told by your health care provider. Ask your health care provider if the medicine prescribed to you: Requires you to avoid driving or using machinery. Can cause constipation. You may need to take these actions to prevent or treat constipation: Drink enough fluid to keep your urine pale yellow. Take over-the-counter or prescription medicines. Eat foods that are high in fiber, such as beans, whole grains, and fresh fruits and vegetables. Limit foods that are high in fat and processed sugars, such as fried or sweet foods. Activity Rest as told by your health care provider. Avoid sitting for a long time without moving. Get up to take short walks every 1-2 hours. This is important to improve blood flow and breathing. Ask for help if you feel weak or unsteady. Return to your normal activities as told by your health care provider. Ask your health care provider what activities are safe for you. Perform relaxation exercises as told by your health care provider. Maintain good posture. Do not lift anything that is heavier than 10 lb (4.5 kg), or the limit that you are told, until your health   care provider says that it is safe. Follow proper lifting and walking techniques as told by your health care provider. Managing pain, stiffness, and swelling     If directed, put ice on the painful area. Icing can help to relieve pain. To do this: Put ice in a plastic bag. Place a towel  between your skin and the bag. Leave the ice on for 20 minutes, 2-3 times a day. Remove the ice if your skin turns bright red. This is very important. If you cannot feel pain, heat, or cold, you have a greater risk of damage to the area. If directed, apply heat to the painful area as often as told by your health care provider. Heat can reduce the stiffness of your muscles. Use the heat source that your health care provider recommends, such as a moist heat pack or a heating pad. Place a towel between your skin and the heat source. Leave the heat on for 20-30 minutes. Remove the heat if your skin turns bright red. This is especially important if you are unable to feel pain, heat, or cold. You may have a greater risk of getting burned. General instructions Change your sitting, standing, and sleeping habits as told by your health care provider. Avoid sitting in the same position for long periods of time. Change positions frequently. Lose weight or maintain a healthy weight as told by your health care provider. Do not use any products that contain nicotine or tobacco, such as cigarettes, e-cigarettes, and chewing tobacco. If you need help quitting, ask your health care provider. Wear supportive footwear. Keep all follow-up visits. This is important. This may include visits for physical therapy. Contact a health care provider if you: Have pain that does not go away within 1-4 weeks. Lose your appetite. Lose weight without trying. Get help right away if you: Have severe pain. Notice weakness in your arms, hands, or legs. Begin to lose control of your bladder or bowel movements. Have fevers or night sweats. Summary Degenerative disk disease is a condition caused by changes that occur in the spinal disks as a person ages. This condition can affect the whole spine. However, the neck and lower back are most often affected. Take over-the-counter and prescription medicines only as told by your health  care provider. This information is not intended to replace advice given to you by your health care provider. Make sure you discuss any questions you have with your health care provider. Document Revised: 05/08/2019 Document Reviewed: 05/08/2019 [employer] Patient Education  2024 [employer]  

## 2022-09-26 NOTE — Progress Notes (Signed)
Allen Burnett 72 y.o.   Chief Complaint  Patient presents with   Referral    Referral to neurosurgeon, for neck and spine issues, issues that has been going on for a while    HISTORY OF PRESENT ILLNESS: This is a 72 y.o. male with history of chronic neck pain secondary to cervical spine disc disease Has appointment to see neurosurgeon today at 2:30 PM.  Needs formal referral. Last cervical spine MRI report reviewed with patient.  Done earlier this year.   HPI   Prior to Admission medications   Medication Sig Start Date End Date Taking? Authorizing Provider  diclofenac (VOLTAREN) 75 MG EC tablet Take 75 mg by mouth 2 (two) times daily. 03/23/22   [provider]  ibuprofen (ADVIL) 600 MG tablet Take 1 tablet (600 mg total) by mouth every 8 (eight) hours as needed. 05/12/21   Elenore Paddy, NP  meclizine (ANTIVERT) 25 MG tablet Take 1 tablet (25 mg total) by mouth 3 (three) times daily as needed for dizziness. 08/31/22   Derwood Kaplan, MD  Multiple Vitamin (MULTIVITAMIN WITH MINERALS) TABS tablet Take 1 tablet by mouth daily.    [provider]  rosuvastatin (CRESTOR) 10 MG tablet Take 1 tablet (10 mg total) by mouth daily. 04/24/22   Georgina Quint, MD  zolpidem (AMBIEN) 10 MG tablet TAKE 1 TABLET BY MOUTH EVERY NIGHT AT BEDTIME AS NEEDED FOR SLEEP 09/19/22   Georgina Quint, MD    No Known Allergies  Patient Active Problem List   Diagnosis Date Noted   Irregular heart beat 05/12/2021   Insomnia 10/29/2013   Melanoma of skin, site unspecified 10/29/2013   Dyslipidemia 10/02/2012   Prostate cancer (HCC) 11/08/2011    Past Medical History:  Diagnosis Date   Acid reflux OCCASIONALLY--  TAKE PREVACID   Atypical nevus 02/20/2006   slight to moderate - right paraspinal, lower back   Atypical nevus 10/01/2013   moderate to severe - upper paraspinal (WS)   Atypical nevus 10/01/2013   moderate - right upper paraspinal (WS)   Atypical nevus 04/27/2016    moderate - right upper back   Basal cell carcinoma 02/20/2006   left medial chest   Basal cell carcinoma 11/05/2013   right upper paraspinal (recheck in 3 months)   Chronic cluster [facility]   PRN OXYGEN VIA [place] WHEN FEELS HEADACHE STARTING/  ON PREVENTION MEDS   Halo nevus 03/30/2016   moderate - right upper back (WS)   Hx of radiation therapy 12/28/11   prostate seed implant/75 seeds/,Dr.Dahlstedt   Hyperlipidemia    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   right angle of jaw - excision   Melanoma (HCC) 02/20/2006   left lateral chest - excision   Melanoma in situ (HCC) 07/27/2009   right lobe of ear   Melanoma in situ (HCC) 10/01/2013   left jawline (MOHs)   Melanoma in situ (HCC) 10/01/2013   left cheek (MOHs)   Prostate cancer (HCC) DX  10/11/11   Adenocarcinoma,gleason 3+3=6,PSA=5.39   volume=30cc   PSA elevation    2009=3.4,2010=3.46,2011=3.16,2012=3.76,07/2011=4.85   SCCA (squamous cell carcinoma) of skin 03/03/2020   Right Lower Shin (Keratoacanthoma)   SCCA (squamous cell carcinoma) of skin 03/03/2020   Right Dorsal Hand (in situ)   Squamous cell carcinoma of dorsum of right hand 08/18/1997   CX3 + 5FU   Squamous cell carcinoma of skin 10/26/2015   Right thigh, proximal   Squamous cell carcinoma, face 10/01/2013  Right forehead - CX3 + 5FU   Squamous cell carcinoma, face 11/15/2017   Right bulb nose - tx p bx    Past Surgical History:  Procedure Laterality Date   APPENDECTOMY  AGE 17   COLONOSCOPY     SEVERAL AT [place] FOR [facility], NO POLYPS PER PT   PROSTATE BIOPSY  10/11/2011   gleason 3+3=6,PSA=5.39,Adenocarcinoma   PROSTATE SURGERY     RADIOACTIVE SEED IMPLANT  12/28/2011   Procedure: RADIOACTIVE SEED IMPLANT;  Surgeon: Marcine Matar, MD;  Location: [facility];  Service: Urology;  Laterality: N/A;  75 seeds implanted  no seeds found in bladder    Social History   Socioeconomic History   Marital status:  Married    Spouse name: Not on file   Number of children: 2   Years of education: Not on file   Highest education level: Not on file  Occupational History    Comment: advertising sales/ retired   Occupation: [employer]    Comment: Part time  Tobacco Use   Smoking status: Former    Current packs/day: 0.00    Average packs/day: 0.3 packs/day for 10.0 years (2.5 ttl pk-yrs)    Types: Cigarettes    Start date: 07/25/1974    Quit date: 07/24/1984    Years since quitting: 38.2   Smokeless tobacco: Former    Types: Chew   Tobacco comments:    light smoker... quit 40 yrs ago  Substance and Sexual Activity   Alcohol use: Yes    Alcohol/week: 6.0 - 8.0 standard drinks of alcohol    Types: 6 - 8 Standard drinks or equivalent per week    Comment: beer on weekends   Drug use: No   Sexual activity: Yes    Partners: Female  Other Topics Concern   Not on file  Social History Narrative   Not on file   Social Determinants of Health   Financial Resource Strain: Low Risk  (06/05/2022)   Overall Financial Resource Strain (CARDIA)    Difficulty of Paying Living Expenses: Not hard at all  Food Insecurity: No Food Insecurity (06/05/2022)   Hunger Vital Sign    Worried About Running Out of Food in the Last Year: Never true    Ran Out of Food in the Last Year: Never true  Transportation Needs: No Transportation Needs (06/03/2022)   PRAPARE - Administrator, Civil Service (Medical): No    Lack of Transportation (Non-Medical): No  Physical Activity: Sufficiently Active (06/05/2022)   Exercise Vital Sign    Days of Exercise per Week: 6 days    Minutes of Exercise per Session: 40 min  Stress: No Stress Concern Present (06/05/2022)   [employer] of Occupational Health - Occupational Stress Questionnaire    Feeling of Stress : Only a little  Social Connections: Unknown (09/25/2022)   Social Connection and Isolation Panel [NHANES]    Frequency of Communication with Friends  and Family: More than three times a week    Frequency of Social Gatherings with Friends and Family: More than three times a week    Attends Religious Services: Not on file    Active Member of Clubs or Organizations: No    Attends Banker Meetings: Not on file    Marital Status: Married  Intimate Partner Violence: Not At Risk (06/05/2022)   Humiliation, Afraid, Rape, and Kick questionnaire    Fear of Current or Ex-Partner: No    Emotionally Abused:  No    Physically Abused: No    Sexually Abused: No    Family History  Problem Relation Age of Onset   Hyperlipidemia Mother    Colon polyps Father    Colon cancer Brother 53   Cancer Brother 52       colon cancer now age 49   Heart disease Maternal Grandfather    Esophageal cancer Neg Hx    Rectal cancer Neg Hx    Stomach cancer Neg Hx      Review of Systems  Constitutional: Negative.  Negative for chills and fever.  HENT: Negative.  Negative for congestion and sore throat.   Respiratory: Negative.  Negative for cough and shortness of breath.   Cardiovascular: Negative.  Negative for chest pain and palpitations.  Gastrointestinal:  Negative for abdominal pain, diarrhea, nausea and vomiting.  Genitourinary: Negative.  Negative for dysuria and hematuria.  Musculoskeletal:  Positive for neck pain.  Skin: Negative.  Negative for rash.  Neurological: Negative.  Negative for dizziness and headaches.  All other systems reviewed and are negative.   Today's Vitals   09/26/22 1302  BP: 118/74  Pulse: 67  Temp: 97.9 F (36.6 C)  TempSrc: Oral  SpO2: 94%  Weight: 206 lb 6 oz (93.6 kg)  Height: 5\' 11"  (1.803 m)   Body mass index is 28.78 kg/m.   Physical Exam Vitals reviewed.  Constitutional:      Appearance: Normal appearance.  HENT:     Head: Normocephalic.  Eyes:     Extraocular Movements: Extraocular movements intact.  Neck:     Comments: Limited range of motion Cardiovascular:     Rate and Rhythm:  Normal rate.  Pulmonary:     Effort: Pulmonary effort is normal.  Skin:    General: Skin is warm and dry.     Capillary Refill: Capillary refill takes less than 2 seconds.  Neurological:     General: No focal deficit present.     Mental Status: He is alert and oriented to person, place, and time.  Psychiatric:        Mood and Affect: Mood normal.        Behavior: Behavior normal.      ASSESSMENT & PLAN: A total of 32 minutes was spent with the patient and counseling/coordination of care regarding preparing for this visit, review of most recent office visit notes, review of most recent emergency department visit on 08/31/2022, review of most recent imaging reports including his cervical spine MRI, pain management, need for neurosurgical evaluation, prognosis, documentation, and need for follow-up.  Problem List Items Addressed This Visit       Musculoskeletal and Integument   Cervical disc disease    Significant diffuse disease seen in recent cervical spine MRI Needs neurosurgical evaluation Has appointment for today.      Relevant Orders   Ambulatory referral to Neurosurgery     Other   Chronic neck pain - Primary    Secondary to cervical disc disease Chronic and affecting quality of life.  Has daily pain. Pain management discussed. Taking diclofenac 75 mg twice a day as needed Recent cervical spine MRI report reviewed Has appointment with neurosurgeon today at 2:30 PM      Patient Instructions  Degenerative Disk Disease  Degenerative disk disease is a condition caused by changes that occur in the spinal disks as a person ages. Spinal disks are soft and compressible disks located between the bones of your spine (vertebrae). These disks act  like shock absorbers. Degenerative disk disease can affect the whole spine. However, the neck and lower back are most often affected. Many changes can occur in the spinal disks with aging, such as: The spinal disks may dry and  shrink. Small tears may occur in the tough, outer covering of the disk (annulus). The disk space may become smaller due to loss of water. Abnormal growths in the bone (spurs) may occur. This can put pressure on the nerve roots exiting the spinal canal, causing pain. The spinal canal may become narrowed. What are the causes? This condition may be caused by: Normal degeneration with age. Injuries. Certain activities and sports that cause damage. What increases the risk? The following factors may make you more likely to develop this condition: Being overweight. Having a family history of degenerative disk disease. Smoking and use of products that contain nicotine and tobacco. Sudden injury. Doing work that requires heavy lifting. What are the signs or symptoms? Symptoms of this condition include: Pain that varies in intensity. Some people have no pain, while others have severe pain. The location of the pain depends on the part of your backbone that is affected. You may have: Pain in your neck or arm if a disk in your neck area is affected. Pain in your back, buttocks, or legs if a disk in your lower back is affected. Pain that becomes worse while bending or reaching up, or with twisting movements. Pain that may start gradually and worsen as time passes. It may also start after a major or minor injury. Numbness or tingling in the arms or legs. How is this diagnosed? This condition may be diagnosed based on: Your symptoms and medical history. A physical exam. Imaging tests, including: X-ray of the spine. CT scan. MRI. How is this treated? This condition may be treated with: Medicines. Injection of steroids into the back. Rehabilitation exercises. These activities aim to strengthen muscles in your back and abdomen to better support your spine. If treatments do not help to relieve your symptoms or you have severe pain, you may need surgery. Follow these instructions at  home: Medicines Take over-the-counter and prescription medicines only as told by your health care provider. Ask your health care provider if the medicine prescribed to you: Requires you to avoid driving or using machinery. Can cause constipation. You may need to take these actions to prevent or treat constipation: Drink enough fluid to keep your urine pale yellow. Take over-the-counter or prescription medicines. Eat foods that are high in fiber, such as beans, whole grains, and fresh fruits and vegetables. Limit foods that are high in fat and processed sugars, such as fried or sweet foods. Activity Rest as told by your health care provider. Avoid sitting for a long time without moving. Get up to take short walks every 1-2 hours. This is important to improve blood flow and breathing. Ask for help if you feel weak or unsteady. Return to your normal activities as told by your health care provider. Ask your health care provider what activities are safe for you. Perform relaxation exercises as told by your health care provider. Maintain good posture. Do not lift anything that is heavier than 10 lb (4.5 kg), or the limit that you are told, until your health care provider says that it is safe. Follow proper lifting and walking techniques as told by your health care provider. Managing pain, stiffness, and swelling     If directed, put ice on the painful area. Icing can help  to relieve pain. To do this: Put ice in a plastic bag. Place a towel between your skin and the bag. Leave the ice on for 20 minutes, 2-3 times a day. Remove the ice if your skin turns bright red. This is very important. If you cannot feel pain, heat, or cold, you have a greater risk of damage to the area. If directed, apply heat to the painful area as often as told by your health care provider. Heat can reduce the stiffness of your muscles. Use the heat source that your health care provider recommends, such as a moist heat pack  or a heating pad. Place a towel between your skin and the heat source. Leave the heat on for 20-30 minutes. Remove the heat if your skin turns bright red. This is especially important if you are unable to feel pain, heat, or cold. You may have a greater risk of getting burned. General instructions Change your sitting, standing, and sleeping habits as told by your health care provider. Avoid sitting in the same position for long periods of time. Change positions frequently. Lose weight or maintain a healthy weight as told by your health care provider. Do not use any products that contain nicotine or tobacco, such as cigarettes, e-cigarettes, and chewing tobacco. If you need help quitting, ask your health care provider. Wear supportive footwear. Keep all follow-up visits. This is important. This may include visits for physical therapy. Contact a health care provider if you: Have pain that does not go away within 1-4 weeks. Lose your appetite. Lose weight without trying. Get help right away if you: Have severe pain. Notice weakness in your arms, hands, or legs. Begin to lose control of your bladder or bowel movements. Have fevers or night sweats. Summary Degenerative disk disease is a condition caused by changes that occur in the spinal disks as a person ages. This condition can affect the whole spine. However, the neck and lower back are most often affected. Take over-the-counter and prescription medicines only as told by your health care provider. This information is not intended to replace advice given to you by your health care provider. Make sure you discuss any questions you have with your health care provider. Document Revised: 05/08/2019 Document Reviewed: 05/08/2019 Elsevier Patient Education  2024 Elsevier Inc.     Edwina Barth, MD [facility]

## 2022-10-10 DIAGNOSIS — M47812 Spondylosis without myelopathy or radiculopathy, cervical region: Secondary | ICD-10-CM | POA: Diagnosis not present

## 2022-10-24 DIAGNOSIS — Z0100 Encounter for examination of eyes and vision without abnormal findings: Secondary | ICD-10-CM | POA: Diagnosis not present

## 2022-10-24 DIAGNOSIS — H2513 Age-related nuclear cataract, bilateral: Secondary | ICD-10-CM | POA: Diagnosis not present

## 2022-11-07 DIAGNOSIS — Z85828 Personal history of other malignant neoplasm of skin: Secondary | ICD-10-CM | POA: Diagnosis not present

## 2022-11-07 DIAGNOSIS — D1801 Hemangioma of skin and subcutaneous tissue: Secondary | ICD-10-CM | POA: Diagnosis not present

## 2022-11-07 DIAGNOSIS — L821 Other seborrheic keratosis: Secondary | ICD-10-CM | POA: Diagnosis not present

## 2022-11-07 DIAGNOSIS — L814 Other melanin hyperpigmentation: Secondary | ICD-10-CM | POA: Diagnosis not present

## 2022-11-07 DIAGNOSIS — L578 Other skin changes due to chronic exposure to nonionizing radiation: Secondary | ICD-10-CM | POA: Diagnosis not present

## 2022-11-07 DIAGNOSIS — Z08 Encounter for follow-up examination after completed treatment for malignant neoplasm: Secondary | ICD-10-CM | POA: Diagnosis not present

## 2022-11-07 DIAGNOSIS — L570 Actinic keratosis: Secondary | ICD-10-CM | POA: Diagnosis not present

## 2022-11-07 DIAGNOSIS — Z86006 Personal history of melanoma in-situ: Secondary | ICD-10-CM | POA: Diagnosis not present

## 2022-11-09 DIAGNOSIS — M47892 Other spondylosis, cervical region: Secondary | ICD-10-CM | POA: Diagnosis not present

## 2022-11-09 DIAGNOSIS — Z6829 Body mass index (BMI) 29.0-29.9, adult: Secondary | ICD-10-CM | POA: Diagnosis not present

## 2022-11-20 ENCOUNTER — Other Ambulatory Visit: Payer: Self-pay | Admitting: Emergency Medicine

## 2022-11-20 DIAGNOSIS — G4700 Insomnia, unspecified: Secondary | ICD-10-CM

## 2022-11-22 ENCOUNTER — Ambulatory Visit: Payer: Medicare HMO | Admitting: Physician Assistant

## 2022-11-24 ENCOUNTER — Encounter: Payer: Self-pay | Admitting: Family Medicine

## 2022-11-24 ENCOUNTER — Ambulatory Visit (INDEPENDENT_AMBULATORY_CARE_PROVIDER_SITE_OTHER): Payer: Medicare HMO | Admitting: Family Medicine

## 2022-11-24 VITALS — BP 110/66 | HR 75 | Temp 98.0°F | Ht 71.0 in | Wt 203.0 lb

## 2022-11-24 DIAGNOSIS — J22 Unspecified acute lower respiratory infection: Secondary | ICD-10-CM

## 2022-11-24 DIAGNOSIS — R0981 Nasal congestion: Secondary | ICD-10-CM | POA: Diagnosis not present

## 2022-11-24 DIAGNOSIS — R051 Acute cough: Secondary | ICD-10-CM

## 2022-11-24 DIAGNOSIS — J0140 Acute pansinusitis, unspecified: Secondary | ICD-10-CM | POA: Diagnosis not present

## 2022-11-24 DIAGNOSIS — J3489 Other specified disorders of nose and nasal sinuses: Secondary | ICD-10-CM

## 2022-11-24 LAB — POC COVID19 BINAXNOW: SARS Coronavirus 2 Ag: NEGATIVE

## 2022-11-24 MED ORDER — BENZONATATE 200 MG PO CAPS: 200.0000 mg | ORAL_CAPSULE | Freq: Two times a day (BID) | ORAL | 0 refills | Status: DC | PRN

## 2022-11-24 MED ORDER — AMOXICILLIN-POT CLAVULANATE 875-125 MG PO TABS: 1.0000 | ORAL_TABLET | Freq: Two times a day (BID) | ORAL | 0 refills | Status: DC

## 2022-11-24 NOTE — Progress Notes (Signed)
Subjective:  Allen Burnett is a 72 y.o. male who presents for a 10 day hx of cough, nasal congestion, and sinus pain.   Denies fever, chills, body aches, ear pain, ST, dizziness, chest pain, palpitations, shortness of breath, abdominal pain, N/V/D, LE edema.    ROS as in subjective.   Objective: Vitals:   11/24/22 1259  BP: 110/66  Pulse: 75  Temp: 98 F (36.7 C)  SpO2: 98%    General appearance: Alert, WD/WN, no distress, mildly ill appearing                             Skin: warm, no rash                           Head: + sinus tenderness                            Eyes: conjunctiva normal, corneas clear, PERRLA                            Ears: pearly TMs, external ear canals normal                          Nose: septum midline, turbinates swollen, with erythema and clear discharge             Mouth/throat: MMM, tongue normal, mild pharyngeal erythema                           Neck: supple, no adenopathy, no thyromegaly, nontender                          Heart: RRR                         Lungs: CTA bilaterally, no wheezes, rales, or rhonchi      Assessment: Acute pansinusitis, recurrence not specified - Plan: amoxicillin-clavulanate (AUGMENTIN) 875-125 MG tablet  Acute cough - Plan: POC COVID-19  Lower respiratory infection - Plan: amoxicillin-clavulanate (AUGMENTIN) 875-125 MG tablet, benzonatate (TESSALON) 200 MG capsule  Nasal congestion with rhinorrhea   Plan: Negative Covid test.  Offered chest X ray-declines for now.  Augmentin and Tessalon prescribed.  Suggested symptomatic OTC remedies. Nasal saline spray for congestion.  Tylenol or Ibuprofen OTC for fever and malaise.   Call/return if not back to baseline after completing the antibiotic.

## 2022-11-24 NOTE — Patient Instructions (Addendum)
Take the antibiotic with food as prescribed.   I also prescribed a cough medication.   Take Mucinex DM over the counter.   Xyzal or Zyrtec or Claritin once daily.   Follow up if you are getting worse or if you are not back to baseline after completing the antibiotic.

## 2022-12-06 ENCOUNTER — Ambulatory Visit: Payer: Self-pay | Admitting: Physician Assistant

## 2022-12-25 ENCOUNTER — Encounter: Payer: Self-pay | Admitting: Physician Assistant

## 2022-12-25 ENCOUNTER — Ambulatory Visit: Payer: Medicare HMO | Admitting: Physician Assistant

## 2022-12-25 VITALS — BP 140/78 | HR 77 | Ht 70.0 in | Wt 203.0 lb

## 2022-12-25 DIAGNOSIS — G4700 Insomnia, unspecified: Secondary | ICD-10-CM | POA: Diagnosis not present

## 2022-12-25 DIAGNOSIS — F411 Generalized anxiety disorder: Secondary | ICD-10-CM

## 2022-12-25 DIAGNOSIS — R454 Irritability and anger: Secondary | ICD-10-CM | POA: Diagnosis not present

## 2022-12-25 MED ORDER — FLUOXETINE HCL 20 MG PO CAPS: 20.0000 mg | ORAL_CAPSULE | Freq: Every day | ORAL | 1 refills | Status: DC

## 2022-12-25 MED ORDER — ALPRAZOLAM 0.5 MG PO TABS: 0.2500 mg | ORAL_TABLET | Freq: Three times a day (TID) | ORAL | 0 refills | Status: DC | PRN

## 2022-12-25 NOTE — Progress Notes (Unsigned)
[facility] MD/PA/NP Initial Note  12/25/2022 7:45 AM Earley Favor  MRN:  [id number]  Chief Complaint:  Chief Complaint   Establish Care    HPI:  Link Snuffer presents with c/o anxiety, feels overwhelmed a lot, no PA but gets irritable and frustrated easily.  Snaps at people when it's uncalled for.  This has been going on a couple of months.  No known trigger.    No signs of depression.  Patient is able to enjoy things.  Energy and motivation are good.  He is retired but does work part-time at [employer] course, more so for fun.  No extreme sadness, tearfulness, or feelings of hopelessness.  Sleeps well most of the time but does need to take the Ambien sometimes.  ADLs and personal hygiene are normal.   Denies any changes in concentration, making decisions, or remembering things.  Appetite has not changed.  Weight is stable.   Denies suicidal or homicidal thoughts.  Patient denies increased energy with decreased need for sleep, increased talkativeness, racing thoughts, impulsivity or risky behaviors, increased spending, increased libido, grandiosity, increased irritability or anger, paranoia, or hallucinations.  Visit Diagnosis:    ICD-10-CM   1. Generalized anxiety disorder  F41.1     2. Irritability  R45.4     3. Insomnia, unspecified type  G47.00      Past Psychiatric History:   Past medications for mental health diagnoses include: Ambien, Prozac when son died several years ago.    Past Medical History:  Past Medical History:  Diagnosis Date   Acid reflux OCCASIONALLY--  TAKE PREVACID   Atypical nevus 02/20/2006   slight to moderate - right paraspinal, lower back   Atypical nevus 10/01/2013   moderate to severe - upper paraspinal (WS)   Atypical nevus 10/01/2013   moderate - right upper paraspinal (WS)   Atypical nevus 04/27/2016   moderate - right upper back   Basal cell carcinoma 02/20/2006   left medial chest   Basal cell carcinoma 11/05/2013   right upper paraspinal (recheck  in 3 months)   Chronic cluster [facility]   PRN OXYGEN VIA [place] WHEN FEELS HEADACHE STARTING/  ON PREVENTION MEDS   Halo nevus 03/30/2016   moderate - right upper back (WS)   Hx of radiation therapy 12/28/11   prostate seed implant/75 seeds/,Dr.Dahlstedt   Hyperlipidemia    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   right angle of jaw - excision   Melanoma (HCC) 02/20/2006   left lateral chest - excision   Melanoma in situ (HCC) 07/27/2009   right lobe of ear   Melanoma in situ (HCC) 10/01/2013   left jawline (MOHs)   Melanoma in situ (HCC) 10/01/2013   left cheek (MOHs)   Prostate cancer (HCC) DX  10/11/11   Adenocarcinoma,gleason 3+3=6,PSA=5.39   volume=30cc   PSA elevation    2009=3.4,2010=3.46,2011=3.16,2012=3.76,07/2011=4.85   SCCA (squamous cell carcinoma) of skin 03/03/2020   Right Lower Shin (Keratoacanthoma)   SCCA (squamous cell carcinoma) of skin 03/03/2020   Right Dorsal Hand (in situ)   Squamous cell carcinoma of dorsum of right hand 08/18/1997   CX3 + 5FU   Squamous cell carcinoma of skin 10/26/2015   Right thigh, proximal   Squamous cell carcinoma, face 10/01/2013   Right forehead - CX3 + 5FU   Squamous cell carcinoma, face 11/15/2017   Right bulb nose - tx p bx    Past Surgical History:  Procedure Laterality Date   APPENDECTOMY  AGE 58  COLONOSCOPY     SEVERAL AT [place] FOR [facility], NO POLYPS PER PT   PROSTATE BIOPSY  10/11/2011   gleason 3+3=6,PSA=5.39,Adenocarcinoma   PROSTATE SURGERY     RADIOACTIVE SEED IMPLANT  12/28/2011   Procedure: RADIOACTIVE SEED IMPLANT;  Surgeon: Marcine Matar, MD;  Location: [facility];  Service: Urology;  Laterality: N/A;  75 seeds implanted  no seeds found in bladder    Family Psychiatric History:  See below  Family History:  Family History  Problem Relation Age of Onset   Hyperlipidemia Mother    Colon polyps Father    Colon cancer Brother 27   Cancer Brother 38        colon cancer now age 2   Heart disease Maternal Grandfather    Esophageal cancer Neg Hx    Rectal cancer Neg Hx    Stomach cancer Neg Hx     Social History:  Social History   Socioeconomic History   Marital status: Married    Spouse name: Not on file   Number of children: 2   Years of education: Not on file   Highest education level: Bachelor's degree (e.g., BA, AB, BS)  Occupational History    Comment: advertising sales/ retired   Occupation: golf course and gym    Comment: Part time  Tobacco Use   Smoking status: Former    Current packs/day: 0.00    Average packs/day: 0.3 packs/day for 10.0 years (2.5 ttl pk-yrs)    Types: Cigarettes    Start date: 07/25/1974    Quit date: 07/24/1984    Years since quitting: 38.4   Smokeless tobacco: Former    Types: Chew   Tobacco comments:    light smoker... quit 40 yrs ago  Substance and Sexual Activity   Alcohol use: Yes    Alcohol/week: 6.0 - 8.0 standard drinks of alcohol    Types: 6 - 8 Standard drinks or equivalent per week    Comment: beer on weekends   Drug use: No   Sexual activity: Yes    Partners: Female  Other Topics Concern   Not on file  Social History Narrative   Worked in [employer] for 35 years. 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       Born and lived in [place] for first 8 years. Then moved to [place]. Graduated from [employer].       Caffeine-1 coffee, occas soft drink.    Legal-none   Religion-Catholic   Social Determinants of Health   Financial Resource Strain: Low Risk  (12/28/2022)   Overall Financial Resource Strain (CARDIA)    Difficulty of Paying Living Expenses: Not hard at all  Food Insecurity: No Food Insecurity (12/28/2022)   Hunger Vital Sign    Worried About Running Out of Food in the Last Year: Never true    Ran Out of Food in the Last Year: Never true  Transportation Needs: No Transportation Needs (12/28/2022)   PRAPARE - Scientist, research (physical sciences) (Medical): No    Lack of Transportation (Non-Medical): No  Physical Activity: Sufficiently Active (12/28/2022)   Exercise Vital Sign    Days of Exercise per Week: 5 days    Minutes of Exercise per Session: 30 min  Stress: No Stress Concern Present (12/28/2022)   [employer] of Occupational Health - Occupational Stress Questionnaire    Feeling of Stress : Not at all  Social Connections: Socially Integrated (12/28/2022)   Social  Connection and Isolation Panel [NHANES]    Frequency of Communication with Friends and Family: More than three times a week    Frequency of Social Gatherings with Friends and Family: More than three times a week    Attends Religious Services: More than 4 times per year    Active Member of Clubs or Organizations: Yes    Attends Engineer, structural: More than 4 times per year    Marital Status: Married    Allergies: No Known Allergies  Metabolic Disorder Labs: Lab Results  Component Value Date   HGBA1C 5.8 04/17/2022   No results found for: "PROLACTIN" Lab Results  Component Value Date   CHOL 175 04/17/2022   TRIG 153.0 (H) 04/17/2022   HDL 66.70 04/17/2022   CHOLHDL 3 04/17/2022   VLDL 30.6 04/17/2022   LDLCALC 77 04/17/2022   LDLCALC 59 04/12/2021   Lab Results  Component Value Date   TSH 1.890 04/09/2018    Therapeutic Level Labs: No results found for: "LITHIUM" No results found for: "VALPROATE" No results found for: "CBMZ"  Current Medications: Current Outpatient Medications  Medication Sig Dispense Refill   ALPRAZolam (XANAX) 0.5 MG tablet Take 0.5-1 tablets (0.25-0.5 mg total) by mouth 3 (three) times daily as needed for anxiety. 60 tablet 0   diclofenac (VOLTAREN) 75 MG EC tablet Take 75 mg by mouth 2 (two) times daily.     FLUoxetine (PROZAC) 20 MG capsule Take 1 capsule (20 mg total) by mouth daily. 30 capsule 1   Multiple Vitamin (MULTIVITAMIN WITH MINERALS) TABS tablet Take 1 tablet by mouth daily.      rosuvastatin (CRESTOR) 10 MG tablet Take 1 tablet (10 mg total) by mouth daily. 90 tablet 3   zolpidem (AMBIEN) 10 MG tablet TAKE 1 TABLET BY MOUTH EVERY NIGHT AT BEDTIME AS NEEDED FOR SLEEP 20 tablet 1   amoxicillin-clavulanate (AUGMENTIN) 875-125 MG tablet Take 1 tablet by mouth 2 (two) times daily. (Patient not taking: Reported on 12/25/2022) 20 tablet 0   benzonatate (TESSALON) 200 MG capsule Take 1 capsule (200 mg total) by mouth 2 (two) times daily as needed for cough. (Patient not taking: Reported on 12/25/2022) 20 capsule 0   ibuprofen (ADVIL) 600 MG tablet Take 1 tablet (600 mg total) by mouth every 8 (eight) hours as needed. (Patient not taking: Reported on 12/25/2022) 30 tablet 0   No current facility-administered medications for this visit.    Medication Side Effects: none  Orders placed this visit:  No orders of the defined types were placed in this encounter.   Psychiatric Specialty Exam:  Review of Systems  Constitutional: Negative.   HENT: Negative.    Eyes: Negative.   Respiratory: Negative.    Cardiovascular: Negative.   Gastrointestinal: Negative.   Endocrine: Negative.   Genitourinary: Negative.   Musculoskeletal:  Positive for neck pain.  Skin: Negative.   Allergic/Immunologic: Negative.   Neurological: Negative.   Hematological: Negative.   Psychiatric/Behavioral:         See HPI    Blood pressure (!) 140/78, pulse 77, height 5\' 10"  (1.778 m), weight 203 lb (92.1 kg).Body mass index is 29.13 kg/m.  General Appearance: Casual and Well Groomed  Eye Contact:  Good  Speech:  Clear and Coherent and Normal Rate  Volume:  Normal  Mood:  Euthymic  Affect:  Congruent  Thought Process:  Goal Directed and Descriptions of Associations: Circumstantial  Orientation:  Full (Time, Place, and Person)  Thought Content: Logical  Suicidal Thoughts:  No  Homicidal Thoughts:  No  Memory:  WNL  Judgement:  Good  Insight:  Good  Psychomotor Activity:  Normal   Concentration:  Concentration: Good  Recall:  Good  Fund of Knowledge: Good  Language: Good  Assets:  Communication Skills Desire for Improvement Financial Resources/Insurance Housing Transportation Vocational/Educational  ADL's:  Intact  Cognition: WNL  Prognosis:  Good   Screenings:  AUDIT    Flowsheet Row Clinical Support from 07/01/2021 in [facility]  Alcohol Use Disorder Identification Test Final Score (AUDIT) 3      GAD-7    Flowsheet Row Telemedicine from 11/07/2019 in Primary Care at [facility] Office Visit from 03/27/2016 in Primary Care at [facility]  Total GAD-7 Score 0 11      PHQ2-9    Flowsheet Row Counselor from 12/25/2022 in [facility] Clinical Support from 06/05/2022 in [facility] Office Visit from 04/24/2022 in [facility] Visit from 10/12/2021 in [facility] Clinical Support from 07/01/2021 in [facility] [place]  PHQ-2 Total Score 0 0 0 0 0      Flowsheet Row ED from 08/31/2022 in [facility] at [facility]  C-SSRS RISK CATEGORY No Risk      Receiving Psychotherapy: No   Treatment Plan/Recommendations:  PDMP reviewed.  Ambien filled 12/15/2022. I provided 60 minutes of face to face time during this encounter, including time spent before and after the visit in records review, medical decision making, counseling pertinent to today's visit, and charting.   I recommend restarting Prozac. I think some of the irritability and anxiety are caused by mild depression  Also add Xanax as needed. We discussed the short-term and long-term risks of benzodiazepines including sedation, increased risk of falling, dizziness, tolerance, and addictive potential.  Patient understands and accepts.   Start Xanax 0.5 mg, 1/2-1 p.o. 3 times daily as needed  anxiety. Restart Prozac 20 mg, 1 p.o. daily. Continue Ambien 10 mg, 1 p.o. nightly as needed. Consider counseling. Return in 4 to 6 weeks.        Melony Overly, PA-C

## 2023-01-09 ENCOUNTER — Other Ambulatory Visit: Payer: Self-pay | Admitting: Emergency Medicine

## 2023-01-09 DIAGNOSIS — G4700 Insomnia, unspecified: Secondary | ICD-10-CM

## 2023-01-24 ENCOUNTER — Other Ambulatory Visit: Payer: Self-pay | Admitting: Physician Assistant

## 2023-01-24 NOTE — Telephone Encounter (Signed)
Lf 11/18

## 2023-02-09 ENCOUNTER — Ambulatory Visit: Payer: Medicare HMO | Admitting: Physician Assistant

## 2023-02-09 ENCOUNTER — Encounter: Payer: Self-pay | Admitting: Physician Assistant

## 2023-02-09 ENCOUNTER — Ambulatory Visit (INDEPENDENT_AMBULATORY_CARE_PROVIDER_SITE_OTHER): Payer: Medicare HMO | Admitting: Physician Assistant

## 2023-02-09 DIAGNOSIS — G4700 Insomnia, unspecified: Secondary | ICD-10-CM | POA: Diagnosis not present

## 2023-02-09 DIAGNOSIS — F32A Depression, unspecified: Secondary | ICD-10-CM

## 2023-02-09 DIAGNOSIS — F411 Generalized anxiety disorder: Secondary | ICD-10-CM

## 2023-02-09 MED ORDER — FLUOXETINE HCL 40 MG PO CAPS: 40.0000 mg | ORAL_CAPSULE | Freq: Every day | ORAL | 1 refills | Status: DC

## 2023-02-09 MED ORDER — ALPRAZOLAM 0.5 MG PO TABS: 0.5000 mg | ORAL_TABLET | Freq: Three times a day (TID) | ORAL | 2 refills | Status: DC | PRN

## 2023-02-09 NOTE — Progress Notes (Signed)
 [facility] Med Check  Patient ID: Allen Burnett,  MRN: [id number]  PCP: Purcell Emil Schanz, MD  Date of Evaluation: 02/09/2023 Time spent:25 minutes  Chief Complaint:  Chief Complaint   Anxiety; Depression; Insomnia; Follow-up    HISTORY/CURRENT STATUS: HPI for 6-week med check.  At the last visit we restarted Prozac  and adding Xanax .  He feels like the anxiety is better but not completely gone.  He is using the Xanax  usually once a day but does not need it more often than that.  He only uses it when he knows he will be in a situation that calls for it or if he is already feeling anxious about something.  He still has trouble sleeping sometimes.  Has a hard time falling asleep and staying asleep.  He will wake up during the night and has a hard time going to sleep sometimes.  He takes Ambien  for that and it is effective.  He would prefer not to need it though.  States he has mild episodes of depression.  It comes and goes but can be bothersome.  It does not stop him from doing things he enjoys like playing golf several times a week.  He also works part-time at [employer].  He is not sure what triggers him.  He does not cry easily.  He just feels down, it can last a few hours or a day or so.  Energy and motivation are good most of the time.  No feelings of hopelessness. ADLs and personal hygiene are normal.   Denies any changes in concentration, making decisions, or remembering things.  Appetite has not changed.  Weight is stable.  Denies suicidal or homicidal thoughts.  Patient denies increased energy with decreased need for sleep, increased talkativeness, racing thoughts, impulsivity or risky behaviors, increased spending, increased libido, grandiosity, increased irritability or anger, paranoia, or hallucinations.  Denies dizziness, syncope, seizures, numbness, tingling, tremor, tics, unsteady gait, slurred speech, confusion. Denies muscle or joint pain, stiffness, or  dystonia.  Individual Medical History/ Review of Systems: Changes? :No   Past medications for mental health diagnoses include: Ambien , Prozac  when son died several years ago.    Allergies: Patient has no known allergies.  Current Medications:  Current Outpatient Medications:    diclofenac (VOLTAREN) 75 MG EC tablet, Take 75 mg by mouth 2 (two) times daily., Disp: , Rfl:    FLUoxetine  (PROZAC ) 40 MG capsule, Take 1 capsule (40 mg total) by mouth daily., Disp: 30 capsule, Rfl: 1   Melatonin 3-10 MG TABS, Take by mouth., Disp: , Rfl:    Multiple Vitamin (MULTIVITAMIN WITH MINERALS) TABS tablet, Take 1 tablet by mouth daily., Disp: , Rfl:    rosuvastatin  (CRESTOR ) 10 MG tablet, Take 1 tablet (10 mg total) by mouth daily., Disp: 90 tablet, Rfl: 3   zolpidem  (AMBIEN ) 10 MG tablet, TAKE 1 TABLET BY MOUTH EVERY NIGHT AT BEDTIME AS NEEDED FOR SLEEP, Disp: 20 tablet, Rfl: 1   ALPRAZolam  (XANAX ) 0.5 MG tablet, Take 1 tablet (0.5 mg total) by mouth 3 (three) times daily as needed for anxiety., Disp: 60 tablet, Rfl: 2   amoxicillin -clavulanate (AUGMENTIN ) 875-125 MG tablet, Take 1 tablet by mouth 2 (two) times daily. (Patient not taking: Reported on 02/09/2023), Disp: 20 tablet, Rfl: 0   benzonatate  (TESSALON ) 200 MG capsule, Take 1 capsule (200 mg total) by mouth 2 (two) times daily as needed for cough. (Patient not taking: Reported on 02/09/2023), Disp: 20 capsule, Rfl: 0   ibuprofen  (ADVIL )  600 MG tablet, Take 1 tablet (600 mg total) by mouth every 8 (eight) hours as needed. (Patient not taking: Reported on 02/09/2023), Disp: 30 tablet, Rfl: 0 Medication Side Effects: none  Family Medical/ Social History: Changes? No  MENTAL HEALTH EXAM:  There were no vitals taken for this visit.There is no height or weight on file to calculate BMI.  General Appearance: Casual and Well Groomed  Eye Contact:  Good  Speech:  Clear and Coherent and Normal Rate  Volume:  Normal  Mood:  Euthymic  Affect:  Congruent   Thought Process:  Goal Directed and Descriptions of Associations: Circumstantial  Orientation:  Full (Time, Place, and Person)  Thought Content: Logical   Suicidal Thoughts:  No  Homicidal Thoughts:  No  Memory:  WNL  Judgement:  Good  Insight:  Good  Psychomotor Activity:  Normal  Concentration:  Concentration: Good  Recall:  Good  Fund of Knowledge: Good  Language: Good  Assets:  Communication Skills Desire for Improvement Financial Resources/Insurance Housing Resilience Transportation  ADL's:  Intact  Cognition: WNL  Prognosis:  Good   DIAGNOSES:    ICD-10-CM   1. Generalized anxiety disorder  F41.1     2. Mild depression  F32.A     3. Insomnia, unspecified type  G47.00      Receiving Psychotherapy: No   RECOMMENDATIONS:   PDMP reviewed.  Ambien  filled 1-20 25.  Xanax  filled 01/24/2023. I provided 25 minutes of face to face time during this encounter, including time spent before and after the visit in records review, medical decision making, counseling pertinent to today's visit, and charting.   I provided approximately 18 minutes of counseling concerning sleep hygiene and healthy lifestyle choices.  He already practices the latter.  He can use the Xanax  for sleep instead of the Ambien  if he would like.  I recommend increasing the Prozac  dose.  He is on the lowest dose which seems to be helping the anxiety some but he has noticed some breakthrough depression symptoms which is not normal for him.  He would like to bump it up.  Continue Xanax  0.5 mg, 1 p.o. 3 times daily as needed anxiety or sleep. Increase Prozac  to 40 mg, 1 p.o. daily. Continue Ambien  10 mg, 1 p.o. nightly as needed.  Do not take with Xanax . Return in 6 weeks.  Verneita Cooks, PA-C

## 2023-02-12 ENCOUNTER — Ambulatory Visit: Payer: Medicare HMO | Admitting: Physician Assistant

## 2023-02-19 ENCOUNTER — Other Ambulatory Visit: Payer: Self-pay | Admitting: Physician Assistant

## 2023-03-11 ENCOUNTER — Other Ambulatory Visit: Payer: Self-pay | Admitting: Emergency Medicine

## 2023-03-11 DIAGNOSIS — G4700 Insomnia, unspecified: Secondary | ICD-10-CM

## 2023-03-23 ENCOUNTER — Ambulatory Visit: Payer: Medicare HMO | Admitting: Physician Assistant

## 2023-04-06 ENCOUNTER — Ambulatory Visit (INDEPENDENT_AMBULATORY_CARE_PROVIDER_SITE_OTHER): Payer: Medicare HMO | Admitting: Physician Assistant

## 2023-04-06 ENCOUNTER — Encounter: Payer: Self-pay | Admitting: Physician Assistant

## 2023-04-06 DIAGNOSIS — F32A Depression, unspecified: Secondary | ICD-10-CM | POA: Diagnosis not present

## 2023-04-06 DIAGNOSIS — F411 Generalized anxiety disorder: Secondary | ICD-10-CM

## 2023-04-06 DIAGNOSIS — G4700 Insomnia, unspecified: Secondary | ICD-10-CM

## 2023-04-06 MED ORDER — FLUOXETINE HCL 20 MG PO CAPS: 60.0000 mg | ORAL_CAPSULE | Freq: Every day | ORAL | 1 refills | Status: DC

## 2023-04-06 NOTE — Progress Notes (Signed)
 [facility] Med Check  Patient ID: Allen Burnett,  MRN: [id number]  PCP: Georgina Quint, MD  Date of Evaluation: 04/06/2023 Time spent:20 minutes  Chief Complaint:  Chief Complaint   Depression; Anxiety; Follow-up; Insomnia    HISTORY/CURRENT STATUS: HPI for 6-week med check.  81 feels like the Prozac is working.  He feels that he could have even more improvement though.  He is more able to relax and not let things bother him like he had in the past.  But would like the Prozac to work even more.  He sometimes loses patience.  He does "get down" sometimes for no reason.  He is still working part-time at [employer].  He enjoys golfing and going to the gym.  He does not skip those things because of his mood.  No extreme sadness, tearfulness, or feelings of hopelessness.  Sleeps well but he does need to take the Ambien some nights.  Sometimes he will take Xanax in the evening which helps him to relax and then he sleeps better.  He can sometimes get overwhelmed and he takes the Xanax then.  Not having panic attacks.  ADLs and personal hygiene are normal.   Denies any changes in concentration, making decisions, or remembering things.  Appetite has not changed.  Weight is stable.   Denies suicidal or homicidal thoughts.  Patient denies increased energy with decreased need for sleep, increased talkativeness, racing thoughts, impulsivity or risky behaviors, increased spending, increased libido, grandiosity, increased irritability or anger, paranoia, or hallucinations.  Denies dizziness, syncope, seizures, numbness, tingling, tremor, tics, unsteady gait, slurred speech, confusion. Denies muscle or joint pain, stiffness, or dystonia.  Individual Medical History/ Review of Systems: Changes? :No   Past medications for mental health diagnoses include: Ambien, Prozac when son died several years ago.    Allergies: Patient has no known allergies.  Current Medications:  Current Outpatient  Medications:    ALPRAZolam (XANAX) 0.5 MG tablet, Take 1 tablet (0.5 mg total) by mouth 3 (three) times daily as needed for anxiety., Disp: 60 tablet, Rfl: 2   diclofenac (VOLTAREN) 75 MG EC tablet, Take 75 mg by mouth 2 (two) times daily., Disp: , Rfl:    FLUoxetine (PROZAC) 20 MG capsule, Take 3 capsules (60 mg total) by mouth daily., Disp: 270 capsule, Rfl: 1   Melatonin 3-10 MG TABS, Take by mouth., Disp: , Rfl:    Multiple Vitamin (MULTIVITAMIN WITH MINERALS) TABS tablet, Take 1 tablet by mouth daily., Disp: , Rfl:    rosuvastatin (CRESTOR) 10 MG tablet, Take 1 tablet (10 mg total) by mouth daily., Disp: 90 tablet, Rfl: 3   zolpidem (AMBIEN) 10 MG tablet, TAKE 1 TABLET BY MOUTH EVERY NIGHT AT BEDTIME AS NEEDED FOR SLEEP, Disp: 20 tablet, Rfl: 1   amoxicillin-clavulanate (AUGMENTIN) 875-125 MG tablet, Take 1 tablet by mouth 2 (two) times daily. (Patient not taking: Reported on 02/09/2023), Disp: 20 tablet, Rfl: 0   benzonatate (TESSALON) 200 MG capsule, Take 1 capsule (200 mg total) by mouth 2 (two) times daily as needed for cough. (Patient not taking: Reported on 02/09/2023), Disp: 20 capsule, Rfl: 0   ibuprofen (ADVIL) 600 MG tablet, Take 1 tablet (600 mg total) by mouth every 8 (eight) hours as needed. (Patient not taking: Reported on 02/09/2023), Disp: 30 tablet, Rfl: 0 Medication Side Effects: none  Family Medical/ Social History: Changes? No  MENTAL HEALTH EXAM:  There were no vitals taken for this visit.There is no height or weight on file  to calculate BMI.  General Appearance: Casual and Well Groomed  Eye Contact:  Good  Speech:  Clear and Coherent and Normal Rate  Volume:  Normal  Mood:  Euthymic  Affect:  Congruent  Thought Process:  Goal Directed and Descriptions of Associations: Circumstantial  Orientation:  Full (Time, Place, and Person)  Thought Content: Logical   Suicidal Thoughts:  No  Homicidal Thoughts:  No  Memory:  WNL  Judgement:  Good  Insight:  Good  Psychomotor  Activity:  Normal  Concentration:  Concentration: Good and Attention Span: Good  Recall:  Good  Fund of Knowledge: Good  Language: Good  Assets:  Communication Skills Desire for Improvement Financial Resources/Insurance Housing Resilience Transportation  ADL's:  Intact  Cognition: WNL  Prognosis:  Good   DIAGNOSES:    ICD-10-CM   1. Generalized anxiety disorder  F41.1     2. Mild depression  F32.A     3. Insomnia, unspecified type  G47.00       Receiving Psychotherapy: No   RECOMMENDATIONS:   PDMP reviewed.  Ambien filled to 04/26/2023.  Xanax filled 03/19/2023. I provided 20 minutes of face to face time during this encounter, including time spent before and after the visit in records review, medical decision making, counseling pertinent to today's visit, and charting.   He is doing better overall but I think increasing the Prozac would be even more beneficial.  He agrees.  Continue Xanax 0.5 mg, 1 p.o. 3 times daily as needed anxiety or sleep. Increase Prozac 60 mg daily. Continue Ambien 10 mg, 1 p.o. nightly as needed.  Do not take with Xanax. Return in 3 months.  Melony Overly, PA-C

## 2023-04-16 ENCOUNTER — Telehealth: Payer: Self-pay

## 2023-04-16 NOTE — Telephone Encounter (Signed)
 Copied from CRM [id number]. Topic: Clinical - Request for Lab/Test Order >> Apr 16, 2023  4:24 PM Isabell A wrote: Reason for CRM: Patient is requesting to have his labs drawn prior to his physical appointment - no orders on file to be scheduled. Patient is requesting a call back.

## 2023-04-18 ENCOUNTER — Other Ambulatory Visit: Payer: Self-pay | Admitting: Radiology

## 2023-04-18 ENCOUNTER — Encounter: Payer: Self-pay | Admitting: Radiology

## 2023-04-18 DIAGNOSIS — Z0000 Encounter for general adult medical examination without abnormal findings: Secondary | ICD-10-CM

## 2023-04-18 NOTE — Telephone Encounter (Signed)
 Okay to order basic labs, CBC, CMP, lipid profile, hemoglobin A1c, PSA.  Thanks.

## 2023-04-30 ENCOUNTER — Encounter: Payer: Self-pay | Admitting: Emergency Medicine

## 2023-04-30 ENCOUNTER — Ambulatory Visit (INDEPENDENT_AMBULATORY_CARE_PROVIDER_SITE_OTHER): Payer: Medicare HMO | Admitting: Emergency Medicine

## 2023-04-30 VITALS — BP 130/76 | HR 64 | Temp 97.8°F | Ht 70.0 in | Wt 204.0 lb

## 2023-04-30 DIAGNOSIS — Z1329 Encounter for screening for other suspected endocrine disorder: Secondary | ICD-10-CM | POA: Diagnosis not present

## 2023-04-30 DIAGNOSIS — Z130 Encounter for screening for diseases of the blood and blood-forming organs and certain disorders involving the immune mechanism: Secondary | ICD-10-CM | POA: Diagnosis not present

## 2023-04-30 DIAGNOSIS — Z0000 Encounter for general adult medical examination without abnormal findings: Secondary | ICD-10-CM | POA: Diagnosis not present

## 2023-04-30 DIAGNOSIS — Z125 Encounter for screening for malignant neoplasm of prostate: Secondary | ICD-10-CM

## 2023-04-30 DIAGNOSIS — E785 Hyperlipidemia, unspecified: Secondary | ICD-10-CM | POA: Diagnosis not present

## 2023-04-30 DIAGNOSIS — Z13228 Encounter for screening for other metabolic disorders: Secondary | ICD-10-CM | POA: Diagnosis not present

## 2023-04-30 LAB — COMPREHENSIVE METABOLIC PANEL
ALT: 26 U/L (ref 0–53)
AST: 20 U/L (ref 0–37)
Albumin: 4.1 g/dL (ref 3.5–5.2)
Alkaline Phosphatase: 51 U/L (ref 39–117)
BUN: 26 mg/dL — ABNORMAL HIGH (ref 6–23)
CO2: 27 meq/L (ref 19–32)
Calcium: 8.9 mg/dL (ref 8.4–10.5)
Chloride: 106 meq/L (ref 96–112)
Creatinine, Ser: 0.96 mg/dL (ref 0.40–1.50)
GFR: 79.2 mL/min (ref >60.00)
Glucose, Bld: 96 mg/dL (ref 70–99)
Potassium: 4.5 meq/L (ref 3.5–5.1)
Sodium: 140 meq/L (ref 135–145)
Total Bilirubin: 0.6 mg/dL (ref 0.2–1.2)
Total Protein: 6.4 g/dL (ref 6.0–8.3)

## 2023-04-30 LAB — CBC WITH DIFFERENTIAL/PLATELET
Basophils Absolute: 0 10*3/uL (ref 0.0–0.1)
Basophils Relative: 0.6 % (ref 0.0–3.0)
Eosinophils Absolute: 0.1 10*3/uL (ref 0.0–0.7)
Eosinophils Relative: 2.8 % (ref 0.0–5.0)
HCT: 41.2 % (ref 39.0–52.0)
Hemoglobin: 13.8 g/dL (ref 13.0–17.0)
Lymphocytes Relative: 21.8 % (ref 12.0–46.0)
Lymphs Abs: 1.1 10*3/uL (ref 0.7–4.0)
MCHC: 33.4 g/dL (ref 30.0–36.0)
MCV: 95.2 fl (ref 78.0–100.0)
Monocytes Absolute: 0.5 10*3/uL (ref 0.1–1.0)
Monocytes Relative: 9.5 % (ref 3.0–12.0)
Neutro Abs: 3.3 10*3/uL (ref 1.4–7.7)
Neutrophils Relative %: 65.3 % (ref 43.0–77.0)
Platelets: 153 10*3/uL (ref 150.0–400.0)
RBC: 4.33 Mil/uL (ref 4.22–5.81)
RDW: 13.2 % (ref 11.5–15.5)
WBC: 5.1 10*3/uL (ref 4.0–10.5)

## 2023-04-30 LAB — LIPID PANEL
Cholesterol: 163 mg/dL (ref 0–200)
HDL: 64.8 mg/dL (ref >39.00)
LDL Cholesterol: 77 mg/dL (ref 0–99)
NonHDL: 97.97
Total CHOL/HDL Ratio: 3
Triglycerides: 106 mg/dL (ref 0.0–149.0)
VLDL: 21.2 mg/dL (ref 0.0–40.0)

## 2023-04-30 LAB — PSA: PSA: 0.02 ng/mL — ABNORMAL LOW (ref 0.10–4.00)

## 2023-04-30 LAB — HEMOGLOBIN A1C: Hgb A1c MFr Bld: 5.6 % (ref 4.6–6.5)

## 2023-04-30 NOTE — Patient Instructions (Signed)
 Health Maintenance, Male  Adopting a healthy lifestyle and getting preventive care are important in promoting health and wellness. Ask your health care provider about:  The right schedule for you to have regular tests and exams.  Things you can do on your own to prevent diseases and keep yourself healthy.  What should I know about diet, weight, and exercise?  Eat a healthy diet    Eat a diet that includes plenty of vegetables, fruits, low-fat dairy products, and lean protein.  Do not eat a lot of foods that are high in solid fats, added sugars, or sodium.  Maintain a healthy weight  Body mass index (BMI) is a measurement that can be used to identify possible weight problems. It estimates body fat based on height and weight. Your health care provider can help determine your BMI and help you achieve or maintain a healthy weight.  Get regular exercise  Get regular exercise. This is one of the most important things you can do for your health. Most adults should:  Exercise for at least 150 minutes each week. The exercise should increase your heart rate and make you sweat (moderate-intensity exercise).  Do strengthening exercises at least twice a week. This is in addition to the moderate-intensity exercise.  Spend less time sitting. Even light physical activity can be beneficial.  Watch cholesterol and blood lipids  Have your blood tested for lipids and cholesterol at 72 years of age, then have this test every 5 years.  You may need to have your cholesterol levels checked more often if:  Your lipid or cholesterol levels are high.  You are older than 72 years of age.  You are at high risk for heart disease.  What should I know about cancer screening?  Many types of cancers can be detected early and may often be prevented. Depending on your health history and family history, you may need to have cancer screening at various ages. This may include screening for:  Colorectal cancer.  Prostate cancer.  Skin cancer.  Lung  cancer.  What should I know about heart disease, diabetes, and high blood pressure?  Blood pressure and heart disease  High blood pressure causes heart disease and increases the risk of stroke. This is more likely to develop in people who have high blood pressure readings or are overweight.  Talk with your health care provider about your target blood pressure readings.  Have your blood pressure checked:  Every 3-5 years if you are 9-95 years of age.  Every year if you are 85 years old or older.  If you are between the ages of 29 and 29 and are a current or former smoker, ask your health care provider if you should have a one-time screening for abdominal aortic aneurysm (AAA).  Diabetes  Have regular diabetes screenings. This checks your fasting blood sugar level. Have the screening done:  Once every three years after age 23 if you are at a normal weight and have a low risk for diabetes.  More often and at a younger age if you are overweight or have a high risk for diabetes.  What should I know about preventing infection?  Hepatitis B  If you have a higher risk for hepatitis B, you should be screened for this virus. Talk with your health care provider to find out if you are at risk for hepatitis B infection.  Hepatitis C  Blood testing is recommended for:  Everyone born from [birth year] through 1965.  Anyone  with known risk factors for hepatitis C.  Sexually transmitted infections (STIs)  You should be screened each year for STIs, including gonorrhea and chlamydia, if:  You are sexually active and are younger than 72 years of age.  You are older than 72 years of age and your health care provider tells you that you are at risk for this type of infection.  Your sexual activity has changed since you were last screened, and you are at increased risk for chlamydia or gonorrhea. Ask your health care provider if you are at risk.  Ask your health care provider about whether you are at high risk for HIV. Your health care provider  may recommend a prescription medicine to help prevent HIV infection. If you choose to take medicine to prevent HIV, you should first get tested for HIV. You should then be tested every 3 months for as long as you are taking the medicine.  Follow these instructions at home:  Alcohol use  Do not drink alcohol if your health care provider tells you not to drink.  If you drink alcohol:  Limit how much you have to 0-2 drinks a day.  Know how much alcohol is in your drink. In the [place] one drink equals one 12 oz bottle of beer (355 mL), one 5 oz glass of wine (148 mL), or one 1 oz glass of hard liquor (44 mL).  Lifestyle  Do not use any products that contain nicotine or tobacco. These products include cigarettes, chewing tobacco, and vaping devices, such as e-cigarettes. If you need help quitting, ask your health care provider.  Do not use street drugs.  Do not share needles.  Ask your health care provider for help if you need support or information about quitting drugs.  General instructions  Schedule regular health, dental, and eye exams.  Stay current with your vaccines.  Tell your health care provider if:  You often feel depressed.  You have ever been abused or do not feel safe at home.  Summary  Adopting a healthy lifestyle and getting preventive care are important in promoting health and wellness.  Follow your health care provider's instructions about healthy diet, exercising, and getting tested or screened for diseases.  Follow your health care provider's instructions on monitoring your cholesterol and blood pressure.  This information is not intended to replace advice given to you by your health care provider. Make sure you discuss any questions you have with your health care provider.  Document Revised: 06/14/2020 Document Reviewed: 06/14/2020  Elsevier Patient Education  2024 [employer].

## 2023-04-30 NOTE — Progress Notes (Signed)
 Allen Burnett 73 y.o.   Chief Complaint  Patient presents with   Annual Exam    No concerns, Patient just wants blood work.     HISTORY OF PRESENT ILLNESS: This is a 73 y.o. male A1A here for annual exam Overall doing well. Brother with history of colon cancer.  Last colonoscopy July 2023.  On 3-year schedules. Healthy lifestyle. Has no complaints or medical concerns today.  HPI   Prior to Admission medications   Medication Sig Start Date End Date Taking? Authorizing Provider  ALPRAZolam Prudy Feeler) 0.5 MG tablet Take 1 tablet (0.5 mg total) by mouth 3 (three) times daily as needed for anxiety. 02/09/23  Yes Cherie Ouch, PA-C  diclofenac (VOLTAREN) 75 MG EC tablet Take 75 mg by mouth. 4 times a week 03/23/22  Yes [provider]  FLUoxetine (PROZAC) 20 MG capsule Take 3 capsules (60 mg total) by mouth daily. 04/06/23  Yes Hurst, Glade Nurse, PA-C  Melatonin 3-10 MG TABS Take by mouth.   Yes [provider]  Multiple Vitamin (MULTIVITAMIN WITH MINERALS) TABS tablet Take 1 tablet by mouth daily.   Yes [provider]  rosuvastatin (CRESTOR) 10 MG tablet Take 1 tablet (10 mg total) by mouth daily. 04/24/22  Yes Nikaela Coyne, Eilleen Kempf, MD  zolpidem (AMBIEN) 10 MG tablet TAKE 1 TABLET BY MOUTH EVERY NIGHT AT BEDTIME AS NEEDED FOR SLEEP 03/12/23  Yes Georgina Quint, MD    No Known Allergies  Patient Active Problem List   Diagnosis Date Noted   Cervical disc disease 09/26/2022   Irregular heart beat 05/12/2021   Chronic neck pain 03/12/2020   Insomnia 10/29/2013   Melanoma of skin (HCC) 10/29/2013   Dyslipidemia 10/02/2012   Prostate cancer (HCC) 11/08/2011    Past Medical History:  Diagnosis Date   Acid reflux OCCASIONALLY--  TAKE PREVACID   Atypical nevus 02/20/2006   slight to moderate - right paraspinal, lower back   Atypical nevus 10/01/2013   moderate to severe - upper paraspinal (WS)   Atypical nevus 10/01/2013   moderate - right upper  paraspinal (WS)   Atypical nevus 04/27/2016   moderate - right upper back   Basal cell carcinoma 02/20/2006   left medial chest   Basal cell carcinoma 11/05/2013   right upper paraspinal (recheck in 3 months)   Chronic cluster [facility]   PRN OXYGEN VIA [place] WHEN FEELS HEADACHE STARTING/  ON PREVENTION MEDS   Halo nevus 03/30/2016   moderate - right upper back (WS)   Hx of radiation therapy 12/28/11   prostate seed implant/75 seeds/,Dr.Dahlstedt   Hyperlipidemia    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   right angle of jaw - excision   Melanoma (HCC) 02/20/2006   left lateral chest - excision   Melanoma in situ (HCC) 07/27/2009   right lobe of ear   Melanoma in situ (HCC) 10/01/2013   left jawline (MOHs)   Melanoma in situ (HCC) 10/01/2013   left cheek (MOHs)   Prostate cancer (HCC) DX  10/11/11   Adenocarcinoma,gleason 3+3=6,PSA=5.39   volume=30cc   PSA elevation    2009=3.4,2010=3.46,2011=3.16,2012=3.76,07/2011=4.85   SCCA (squamous cell carcinoma) of skin 03/03/2020   Right Lower Shin (Keratoacanthoma)   SCCA (squamous cell carcinoma) of skin 03/03/2020   Right Dorsal Hand (in situ)   Squamous cell carcinoma of dorsum of right hand 08/18/1997   CX3 + 5FU   Squamous cell carcinoma of skin 10/26/2015   Right thigh, proximal  Squamous cell carcinoma, face 10/01/2013   Right forehead - CX3 + 5FU   Squamous cell carcinoma, face 11/15/2017   Right bulb nose - tx p bx    Past Surgical History:  Procedure Laterality Date   APPENDECTOMY  AGE 67   COLONOSCOPY     SEVERAL AT [place] [facility], NO POLYPS PER PT   PROSTATE BIOPSY  10/11/2011   gleason 3+3=6,PSA=5.39,Adenocarcinoma   PROSTATE SURGERY     RADIOACTIVE SEED IMPLANT  12/28/2011   Procedure: RADIOACTIVE SEED IMPLANT;  Surgeon: Marcine Matar, MD;  Location: [facility];  Service: Urology;  Laterality: N/A;  75 seeds implanted  no seeds found in bladder    Social History    Socioeconomic History   Marital status: Married    Spouse name: Not on file   Number of children: 2   Years of education: Not on file   Highest education level: Bachelor's degree (e.g., BA, AB, BS)  Occupational History    Comment: advertising sales/ retired   Occupation: golf course and gym    Comment: Part time  Tobacco Use   Smoking status: Former    Current packs/day: 0.00    Average packs/day: 0.3 packs/day for 10.0 years (2.5 ttl pk-yrs)    Types: Cigarettes    Start date: 07/25/1974    Quit date: 07/24/1984    Years since quitting: 38.7   Smokeless tobacco: Former    Types: Chew   Tobacco comments:    light smoker... quit 40 yrs ago  Substance and Sexual Activity   Alcohol use: Yes    Alcohol/week: 6.0 - 8.0 standard drinks of alcohol    Types: 6 - 8 Standard drinks or equivalent per week    Comment: beer on weekends   Drug use: No   Sexual activity: Yes    Partners: Female  Other Topics Concern   Not on file  Social History Narrative   Worked in [employer] for 35 years. 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       Born and lived in [place] for first 8 years. Then moved to [place]. Graduated from [employer].       Caffeine-1 coffee, occas soft drink.    Legal-none   Religion-Catholic   Social Drivers of Health   Financial Resource Strain: Low Risk  (12/28/2022)   Overall Financial Resource Strain (CARDIA)    Difficulty of Paying Living Expenses: Not hard at all  Food Insecurity: No Food Insecurity (12/28/2022)   Hunger Vital Sign    Worried About Running Out of Food in the Last Year: Never true    Ran Out of Food in the Last Year: Never true  Transportation Needs: No Transportation Needs (12/28/2022)   PRAPARE - Administrator, Civil Service (Medical): No    Lack of Transportation (Non-Medical): No  Physical Activity: Sufficiently Active (12/28/2022)   Exercise Vital Sign    Days of Exercise per Week: 5 days     Minutes of Exercise per Session: 30 min  Stress: No Stress Concern Present (12/28/2022)   [employer] of Occupational Health - Occupational Stress Questionnaire    Feeling of Stress : Not at all  Social Connections: Socially Integrated (12/28/2022)   Social Connection and Isolation Panel [NHANES]    Frequency of Communication with Friends and Family: More than three times a week    Frequency of Social Gatherings with Friends and Family: More than three times a week  Attends Religious Services: More than 4 times per year    Active Member of Clubs or Organizations: Yes    Attends Banker Meetings: More than 4 times per year    Marital Status: Married  Catering manager Violence: Not At Risk (06/05/2022)   Humiliation, Afraid, Rape, and Kick questionnaire    Fear of Current or Ex-Partner: No    Emotionally Abused: No    Physically Abused: No    Sexually Abused: No    Family History  Problem Relation Age of Onset   Hyperlipidemia Mother    Colon polyps Father    Colon cancer Brother 48   Cancer Brother 62       colon cancer now age 35   Heart disease Maternal Grandfather    Esophageal cancer Neg Hx    Rectal cancer Neg Hx    Stomach cancer Neg Hx      Review of Systems  Constitutional:  Negative for chills and fever.  HENT: Negative.  Negative for congestion and sore throat.   Respiratory: Negative.  Negative for cough and shortness of breath.   Cardiovascular: Negative.  Negative for chest pain and palpitations.  Gastrointestinal:  Positive for heartburn. Negative for abdominal pain, diarrhea, nausea and vomiting.  Genitourinary: Negative.  Negative for dysuria and hematuria.  Skin: Negative.  Negative for rash.  Neurological: Negative.  Negative for dizziness and headaches.    Vitals:   04/30/23 0809  BP: 130/76  Pulse: 64  Temp: 97.8 F (36.6 C)  SpO2: 99%    Physical Exam Vitals reviewed.  Constitutional:      Appearance: Normal appearance.   HENT:     Head: Normocephalic.     Right Ear: Tympanic membrane, ear canal and external ear normal.     Left Ear: Tympanic membrane, ear canal and external ear normal.     Mouth/Throat:     Mouth: Mucous membranes are moist.     Pharynx: Oropharynx is clear.  Eyes:     Extraocular Movements: Extraocular movements intact.     Conjunctiva/sclera: Conjunctivae normal.     Pupils: Pupils are equal, round, and reactive to light.  Cardiovascular:     Rate and Rhythm: Normal rate and regular rhythm.     Pulses: Normal pulses.     Heart sounds: Normal heart sounds.  Pulmonary:     Effort: Pulmonary effort is normal.     Breath sounds: Normal breath sounds.  Abdominal:     Palpations: Abdomen is soft.  Musculoskeletal:     Cervical back: No tenderness.     Right lower leg: No edema.     Left lower leg: No edema.  Lymphadenopathy:     Cervical: No cervical adenopathy.  Skin:    General: Skin is warm and dry.  Neurological:     General: No focal deficit present.     Mental Status: He is alert and oriented to person, place, and time.  Psychiatric:        Mood and Affect: Mood normal.        Behavior: Behavior normal.      ASSESSMENT & PLAN: Problem List Items Addressed This Visit       Other   Dyslipidemia   Relevant Orders   Lipid panel   Other Visit Diagnoses       Routine general medical examination at a health care facility    -  Primary   Relevant Orders   PSA   CBC with  Differential/Platelet   Comprehensive metabolic panel   Hemoglobin A1c   Lipid panel     Screening for deficiency anemia       Relevant Orders   CBC with Differential/Platelet     Screening for endocrine, metabolic and immunity disorder       Relevant Orders   Comprehensive metabolic panel   Hemoglobin A1c     Screening for prostate cancer       Relevant Orders   PSA      Modifiable risk factors discussed with patient. Anticipatory guidance according to age provided. The following  topics were also discussed: Social Determinants of Health Smoking.  Non-smoker Diet and nutrition Benefits of exercise Cancer screening and need for repeat colonoscopy this year Vaccinations review and recommendations Cardiovascular risk assessment and need for blood work The 10-year ASCVD risk score (Arnett DK, et al., 2019) is: 17.9%   Values used to calculate the score:     Age: 61 years     Sex: Male     Is Non-Hispanic African American: No     Diabetic: No     Tobacco smoker: No     Systolic Blood Pressure: 130 mmHg     Is BP treated: No     HDL Cholesterol: 66.7 mg/dL     Total Cholesterol: 175 mg/dL  Mental health including depression and anxiety Fall and accident prevention  Patient Instructions  Health Maintenance, Male Adopting a healthy lifestyle and getting preventive care are important in promoting health and wellness. Ask your health care provider about: The right schedule for you to have regular tests and exams. Things you can do on your own to prevent diseases and keep yourself healthy. What should I know about diet, weight, and exercise? Eat a healthy diet  Eat a diet that includes plenty of vegetables, fruits, low-fat dairy products, and lean protein. Do not eat a lot of foods that are high in solid fats, added sugars, or sodium. Maintain a healthy weight Body mass index (BMI) is a measurement that can be used to identify possible weight problems. It estimates body fat based on height and weight. Your health care provider can help determine your BMI and help you achieve or maintain a healthy weight. Get regular exercise Get regular exercise. This is one of the most important things you can do for your health. Most adults should: Exercise for at least 150 minutes each week. The exercise should increase your heart rate and make you sweat (moderate-intensity exercise). Do strengthening exercises at least twice a week. This is in addition to the moderate-intensity  exercise. Spend less time sitting. Even light physical activity can be beneficial. Watch cholesterol and blood lipids Have your blood tested for lipids and cholesterol at 73 years of age, then have this test every 5 years. You may need to have your cholesterol levels checked more often if: Your lipid or cholesterol levels are high. You are older than 72 years of age. You are at high risk for heart disease. What should I know about cancer screening? Many types of cancers can be detected early and may often be prevented. Depending on your health history and family history, you may need to have cancer screening at various ages. This may include screening for: Colorectal cancer. Prostate cancer. Skin cancer. Lung cancer. What should I know about heart disease, diabetes, and high blood pressure? Blood pressure and heart disease High blood pressure causes heart disease and increases the risk of stroke. This is more  likely to develop in people who have high blood pressure readings or are overweight. Talk with your health care provider about your target blood pressure readings. Have your blood pressure checked: Every 3-5 years if you are 87-91 years of age. Every year if you are 42 years old or older. If you are between the ages of 20 and 67 and are a current or former smoker, ask your health care provider if you should have a one-time screening for abdominal aortic aneurysm (AAA). Diabetes Have regular diabetes screenings. This checks your fasting blood sugar level. Have the screening done: Once every three years after age 66 if you are at a normal weight and have a low risk for diabetes. More often and at a younger age if you are overweight or have a high risk for diabetes. What should I know about preventing infection? Hepatitis B If you have a higher risk for hepatitis B, you should be screened for this virus. Talk with your health care provider to find out if you are at risk for hepatitis B  infection. Hepatitis C Blood testing is recommended for: Everyone born from [birth year] through 1964. Anyone with known risk factors for hepatitis C. Sexually transmitted infections (STIs) You should be screened each year for STIs, including gonorrhea and chlamydia, if: You are sexually active and are younger than 72 years of age. You are older than 72 years of age and your health care provider tells you that you are at risk for this type of infection. Your sexual activity has changed since you were last screened, and you are at increased risk for chlamydia or gonorrhea. Ask your health care provider if you are at risk. Ask your health care provider about whether you are at high risk for HIV. Your health care provider may recommend a prescription medicine to help prevent HIV infection. If you choose to take medicine to prevent HIV, you should first get tested for HIV. You should then be tested every 3 months for as long as you are taking the medicine. Follow these instructions at home: Alcohol use Do not drink alcohol if your health care provider tells you not to drink. If you drink alcohol: Limit how much you have to 0-2 drinks a day. Know how much alcohol is in your drink. In the [place] one drink equals one 12 oz bottle of beer (355 mL), one 5 oz glass of wine (148 mL), or one 1 oz glass of hard liquor (44 mL). Lifestyle Do not use any products that contain nicotine or tobacco. These products include cigarettes, chewing tobacco, and vaping devices, such as e-cigarettes. If you need help quitting, ask your health care provider. Do not use street drugs. Do not share needles. Ask your health care provider for help if you need support or information about quitting drugs. General instructions Schedule regular health, dental, and eye exams. Stay current with your vaccines. Tell your health care provider if: You often feel depressed. You have ever been abused or do not feel safe at  home. Summary Adopting a healthy lifestyle and getting preventive care are important in promoting health and wellness. Follow your health care provider's instructions about healthy diet, exercising, and getting tested or screened for diseases. Follow your health care provider's instructions on monitoring your cholesterol and blood pressure. This information is not intended to replace advice given to you by your health care provider. Make sure you discuss any questions you have with your health care provider. Document Revised: 06/14/2020 Document Reviewed:  06/14/2020 Elsevier Patient Education  2024 [employer] Inc[employer]    Edwina Barth, MD [facility]

## 2023-05-08 DIAGNOSIS — Z85828 Personal history of other malignant neoplasm of skin: Secondary | ICD-10-CM | POA: Diagnosis not present

## 2023-05-08 DIAGNOSIS — L821 Other seborrheic keratosis: Secondary | ICD-10-CM | POA: Diagnosis not present

## 2023-05-08 DIAGNOSIS — Z86006 Personal history of melanoma in-situ: Secondary | ICD-10-CM | POA: Diagnosis not present

## 2023-05-08 DIAGNOSIS — Z08 Encounter for follow-up examination after completed treatment for malignant neoplasm: Secondary | ICD-10-CM | POA: Diagnosis not present

## 2023-05-08 DIAGNOSIS — D225 Melanocytic nevi of trunk: Secondary | ICD-10-CM | POA: Diagnosis not present

## 2023-05-08 DIAGNOSIS — L814 Other melanin hyperpigmentation: Secondary | ICD-10-CM | POA: Diagnosis not present

## 2023-05-11 ENCOUNTER — Other Ambulatory Visit: Payer: Self-pay | Admitting: Physician Assistant

## 2023-05-11 ENCOUNTER — Other Ambulatory Visit: Payer: Self-pay | Admitting: Emergency Medicine

## 2023-05-11 DIAGNOSIS — G4700 Insomnia, unspecified: Secondary | ICD-10-CM

## 2023-05-30 ENCOUNTER — Other Ambulatory Visit: Payer: Self-pay | Admitting: Emergency Medicine

## 2023-05-30 DIAGNOSIS — E785 Hyperlipidemia, unspecified: Secondary | ICD-10-CM

## 2023-06-20 ENCOUNTER — Other Ambulatory Visit (HOSPITAL_BASED_OUTPATIENT_CLINIC_OR_DEPARTMENT_OTHER): Payer: Self-pay | Admitting: Emergency Medicine

## 2023-06-20 ENCOUNTER — Other Ambulatory Visit (HOSPITAL_BASED_OUTPATIENT_CLINIC_OR_DEPARTMENT_OTHER): Payer: Self-pay

## 2023-06-26 ENCOUNTER — Other Ambulatory Visit: Payer: Self-pay | Admitting: Physician Assistant

## 2023-06-26 ENCOUNTER — Other Ambulatory Visit: Payer: Self-pay | Admitting: Emergency Medicine

## 2023-06-26 DIAGNOSIS — G4700 Insomnia, unspecified: Secondary | ICD-10-CM

## 2023-06-27 ENCOUNTER — Other Ambulatory Visit (HOSPITAL_BASED_OUTPATIENT_CLINIC_OR_DEPARTMENT_OTHER): Payer: Self-pay

## 2023-07-04 ENCOUNTER — Other Ambulatory Visit: Payer: Self-pay

## 2023-07-04 ENCOUNTER — Ambulatory Visit: Admitting: Family Medicine

## 2023-07-04 ENCOUNTER — Encounter: Payer: Self-pay | Admitting: Family Medicine

## 2023-07-04 VITALS — BP 122/86 | Ht 71.0 in | Wt 200.0 lb

## 2023-07-04 DIAGNOSIS — S76301D Unspecified injury of muscle, fascia and tendon of the posterior muscle group at thigh level, right thigh, subsequent encounter: Secondary | ICD-10-CM

## 2023-07-04 DIAGNOSIS — M7071 Other bursitis of hip, right hip: Secondary | ICD-10-CM | POA: Insufficient documentation

## 2023-07-04 MED ORDER — METHYLPREDNISOLONE ACETATE 40 MG/ML IJ SUSP
40.0000 mg | Freq: Once | INTRAMUSCULAR | Status: AC
  Administered 2023-07-04: 40 mg via INTRA_ARTICULAR

## 2023-07-04 NOTE — Progress Notes (Signed)
 PCP: Elvira Hammersmith, MD  Subjective:   CC: RT hamstring pain  HPI: Patient is a 73 y.o. male here for RT hamstring pain.  Allen Burnett reports 54-month hx of worsening RT proximal hamstring injury. His pain is localized with no radiating pain. No recent changes in activity level. His pain is worse with sitting in a car and walking but is not affected with doing the elliptical or working out at the gym. Distant hx of hamstring injury in college. Patient has been taking PO Diclofenac for neck pain that has not helped with the hamstring pain.  Past Medical History:  Diagnosis Date   Acid reflux OCCASIONALLY--  TAKE PREVACID   Atypical nevus 02/20/2006   slight to moderate - right paraspinal, lower back   Atypical nevus 10/01/2013   moderate to severe - upper paraspinal (WS)   Atypical nevus 10/01/2013   moderate - right upper paraspinal (WS)   Atypical nevus 04/27/2016   moderate - right upper back   Basal cell carcinoma 02/20/2006   left medial chest   Basal cell carcinoma 11/05/2013   right upper paraspinal (recheck in 3 months)   Chronic cluster [facility]   PRN OXYGEN VIA [place] WHEN FEELS HEADACHE STARTING/  ON PREVENTION MEDS   Halo nevus 03/30/2016   moderate - right upper back (WS)   Hx of radiation therapy 12/28/11   prostate seed implant/75 seeds/,Dr.Dahlstedt   Hyperlipidemia    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   right angle of jaw - excision   Melanoma (HCC) 02/20/2006   left lateral chest - excision   Melanoma in situ (HCC) 07/27/2009   right lobe of ear   Melanoma in situ (HCC) 10/01/2013   left jawline (MOHs)   Melanoma in situ (HCC) 10/01/2013   left cheek (MOHs)   Prostate cancer (HCC) DX  10/11/11   Adenocarcinoma,gleason 3+3=6,PSA=5.39   volume=30cc   PSA elevation    2009=3.4,2010=3.46,2011=3.16,2012=3.76,07/2011=4.85   SCCA (squamous cell carcinoma) of skin 03/03/2020   Right Lower Shin (Keratoacanthoma)   SCCA (squamous  cell carcinoma) of skin 03/03/2020   Right Dorsal Hand (in situ)   Squamous cell carcinoma of dorsum of right hand 08/18/1997   CX3 + 5FU   Squamous cell carcinoma of skin 10/26/2015   Right thigh, proximal   Squamous cell carcinoma, face 10/01/2013   Right forehead - CX3 + 5FU   Squamous cell carcinoma, face 11/15/2017   Right bulb nose - tx p bx    Current Outpatient Medications on File Prior to Visit  Medication Sig Dispense Refill   ALPRAZolam  (XANAX ) 0.5 MG tablet TAKE 1 TABLET BY MOUTH 3 TIMES A DAY AS NEEDED FOR ANXIETY 60 tablet 1   diclofenac (VOLTAREN) 75 MG EC tablet Take 75 mg by mouth. 4 times a week     FLUoxetine  (PROZAC ) 20 MG capsule Take 3 capsules (60 mg total) by mouth daily. 270 capsule 1   Melatonin 3-10 MG TABS Take by mouth.     Multiple Vitamin (MULTIVITAMIN WITH MINERALS) TABS tablet Take 1 tablet by mouth daily.     rosuvastatin  (CRESTOR ) 10 MG tablet TAKE 1 TABLET BY MOUTH DAILY 90 tablet 3   zolpidem  (AMBIEN ) 10 MG tablet TAKE 1 TABLET BY MOUTH EVERY NIGHT AT BEDTIME AS NEEDED FOR SLEEP 20 tablet 1   No current facility-administered medications on file prior to visit.    Past Surgical History:  Procedure Laterality Date   APPENDECTOMY  AGE 61  COLONOSCOPY     SEVERAL AT [facility] FOR [facility], NO POLYPS PER PT   PROSTATE BIOPSY  10/11/2011   gleason 3+3=6,PSA=5.39,Adenocarcinoma   PROSTATE SURGERY     RADIOACTIVE SEED IMPLANT  12/28/2011   Procedure: RADIOACTIVE SEED IMPLANT;  Surgeon: Trent Frizzle, MD;  Location: [facility];  Service: Urology;  Laterality: N/A;  75 seeds implanted  no seeds found in bladder    No Known Allergies  BP 122/86   Ht 5\' 11"  (1.803 m)   Wt 200 lb (90.7 kg)   BMI 27.89 kg/m       No data to display              No data to display              Objective:  Physical Exam:  Gen: NAD, comfortable in exam room  RT lower extremity exam: No obvious deformity. TTP of the ischial tuberosity  and hamstring at the origin and proximal hamstring. No TTP of the distal hamstring. Full ROM. 5/5 strength with knee extension and flexion. N/V intact distally.   Limited US  of RT Ischial Tuberosity performed by Dr. Dannis Dy 07/04/23: Moderate hypoechoic accumulation around ischial tuberosity with hypoechoic changes in the proximal hamstring tendons.  Impression: Ischial bursitis on the right Hamstring tendonitis on the RT   Assessment & Plan:  Allen Burnett is 73yo M presenting for 58-month hx of worsening RT hamstring pain  1. RT hamstring pain: Patient endorses 11-month hx of worsening RT hamstring pain that is most significant when sitting for long periods of time. Exam today showed TTP along RT ischial tuberosity and ultrasound showed moderate bursitis with hamstring tendonitis on the RT. Discussed risks and benefits of ultrasound guided injection vs PO steroid course. Patient elected to proceed with injection. -Steroid injection performed by Dr. Dannis Dy of ischial bursa as described below. Patient tolerated the procedure well. -Provided at-home PT hamstring strengthening exercises -F/u in 4-6 weeks or sooner if no improvement or worsening of symptoms  Allen Burnett, MS4 [employer]  ______________________________________________________________ Sports medicine fellow attestation   I was present for the evaluation and agree with the above with the following modifications/additions.   Subjective:  The patient reports tenderness over the right posterior inferior buttock that is worsened with sitting but not with walking, going up or down stairs, or laying down. He denies any injury or acute change in his activity. The pain does not radiate and he denies associated numbness, weakness, swelling, fever, or chills.   Objective:  Physical Exam: VS: BP:122/86  HR: bpm  TEMP: ( )  RESP:   HT:5\' 11"  (180.3 cm)   WT:200 lb (90.7 kg)  BMI:27.91  Gen: NAD, speaks clearly, comfortable in exam  room Respiratory: Normal respiratory effort on room air. No signs of distress Skin: No rashes, abrasions, or ecchymosis MSK: Right hip ROM full Piriformis test negative FADIR and FABER test negative Strength 5/5 in flexion and abduction TTP over the proximal hamstring at the ischial tuberosity Hamstring resisted flexion NT No TTP over the hamstring muscle and no defects appreciated  Limited US  of the Right hamstring:  The ischial tuberosity is visualized in SAX and LAX.  Hypoechoic fluid collection superficial to the osteum with some hyperechoic changes in the proximal most aspect of the hamstring without acoustic shadowing No evidence of avulsion and tendon fibers in tact The artery and sciatic nerve are visualized lateral to the fluid collection   Summary: Ischial bursitis  Ultrasound  and interpretation by Dr. Dannis Dy and Dr. Howard Macho    Assessment & Plan:   Ischial bursitis of right side - I dicussed the US  results with the patient as well as treatment options including oral steroids, conservative management and injection therapy. After joint decision making the patient elected for injection as below.  - Eccentric hamstring exercises given as well.  - We will follow up as needed.   PROCEDURE: Risks & benefits of RT u/s guided ischial bursa injection reviewed. Written consent obtained. Time-out completed. Patient prepped and draped in the normal fashion. Musculoskeletal ultrasound used to identify appropriate anatomy. U/S exam showing ischial bursitis and hamstring tendonitis. After identifying appropriate anatomy and neurovascular structures with Doppler, patient positioned & area cleansed with chlorhexidine. U/S probe covered with Tegaderm and sterile gel applied on the probe. Ethyl chloride spray used to anesthetize the skin. After ensuring adequate anesthesia a solution of 3 mL 1% lidocaine  with 1 mL methylprednisolone  (Depo-medrol ) 40mg /mL injected into the RT ischial bursa using a  25-gauge 3.5-inch spinal needle under ultrasound guidance. Needle well-visualized in the ischial bursa. Images saved. Patient tolerated procedure well without any complications. Area covered with adhesive bandage. Post-procedure care reviewed. All questions answered.    Berneda Bridges MD [facility] Sports Medicine Fellow

## 2023-07-04 NOTE — Assessment & Plan Note (Addendum)
-   I dicussed the US  results with the patient as well as treatment options including oral steroids, conservative management and injection therapy. After joint decision making the patient elected for injection as below.  - Eccentric hamstring exercises given as well.  - We will follow up as needed.   PROCEDURE: Risks & benefits of RT u/s guided ischial bursa injection reviewed. Written consent obtained. Time-out completed. Patient prepped and draped in the normal fashion. Musculoskeletal ultrasound used to identify appropriate anatomy. U/S exam showing ischial bursitis and hamstring tendonitis. After identifying appropriate anatomy and neurovascular structures with Doppler, patient positioned & area cleansed with chlorhexidine. U/S probe covered with Tegaderm and sterile gel applied on the probe. Ethyl chloride spray used to anesthetize the skin. After ensuring adequate anesthesia a solution of 3 mL 1% lidocaine  with 1 mL methylprednisolone (Depo-medrol) 40mg /mL injected into the RT ischial bursa using a 25-gauge 3.5-inch spinal needle under ultrasound guidance. Needle well-visualized in the ischial bursa. Images saved. Patient tolerated procedure well without any complications. Area covered with adhesive bandage. Post-procedure care reviewed. All questions answered.

## 2023-07-05 NOTE — Patient Instructions (Signed)

## 2023-07-19 IMAGING — DX DG SHOULDER 2+V*L*
3 series · 3 of 3 positions shown · non-contrast
Comparison: None.

CLINICAL DATA: Left shoulder pain.

EXAM:
LEFT SHOULDER - 2+ VIEW

[shoulder ap (1 of 2)]
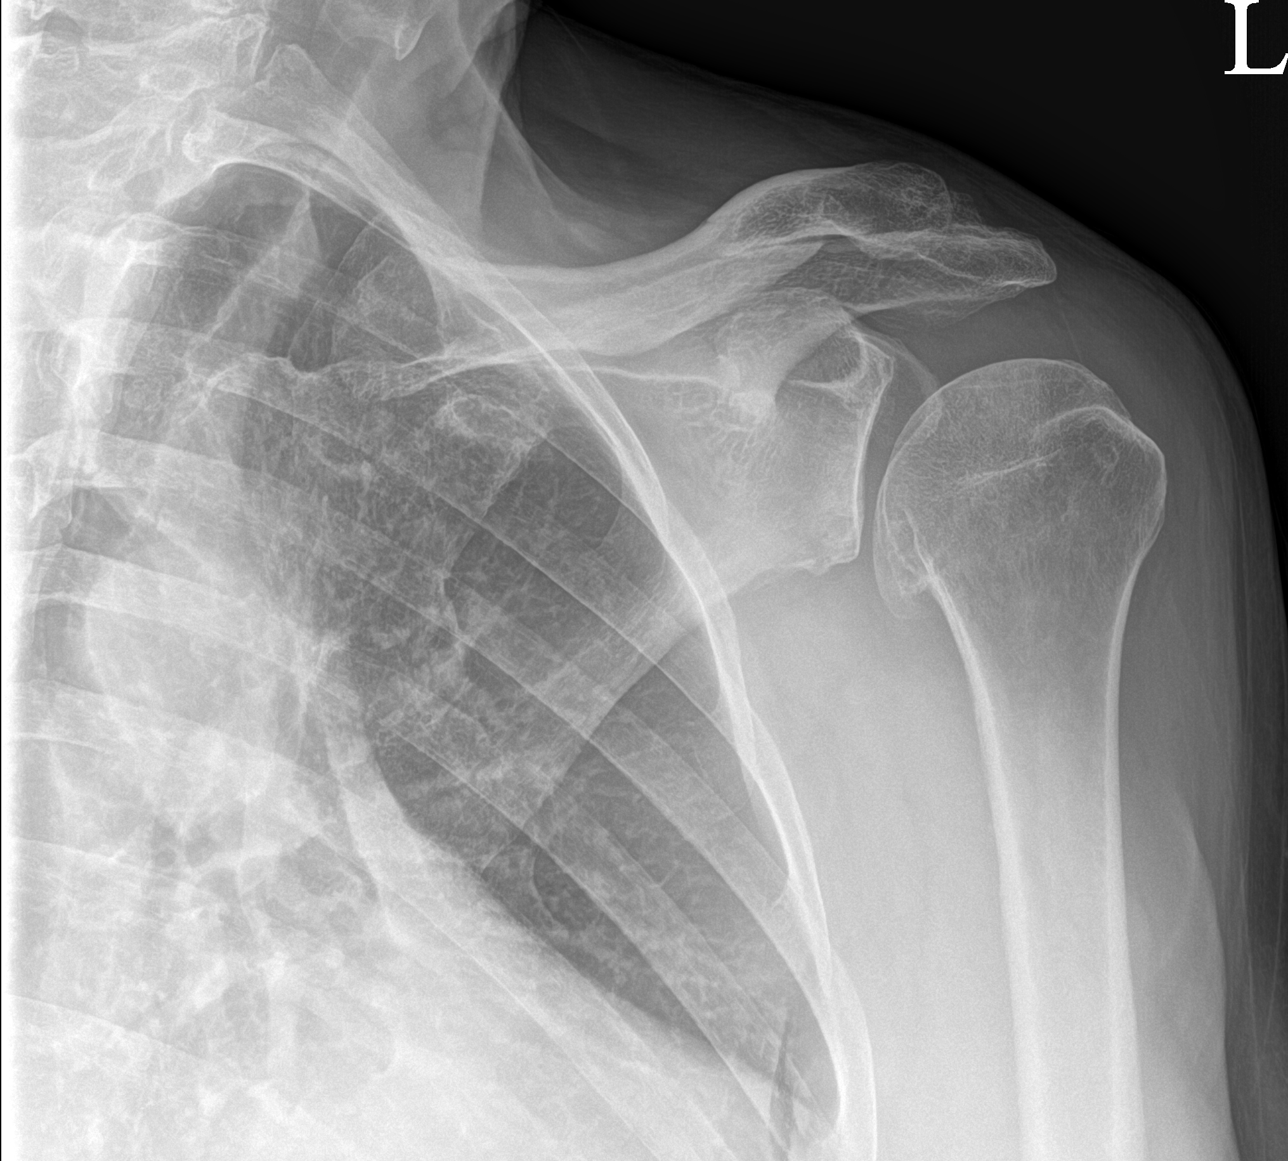

[shoulder ap (2 of 2)]
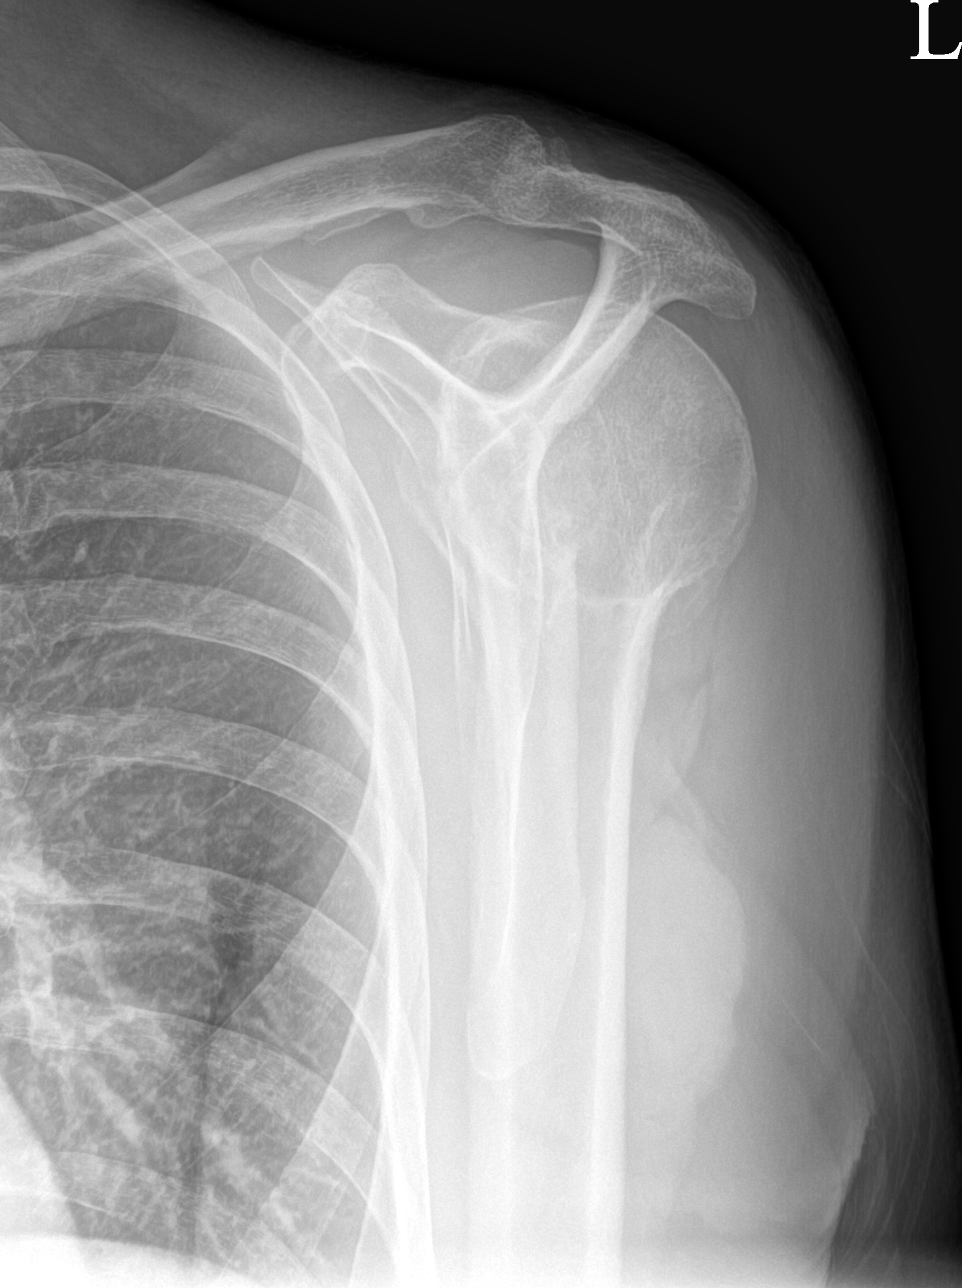

[shoulder axial]
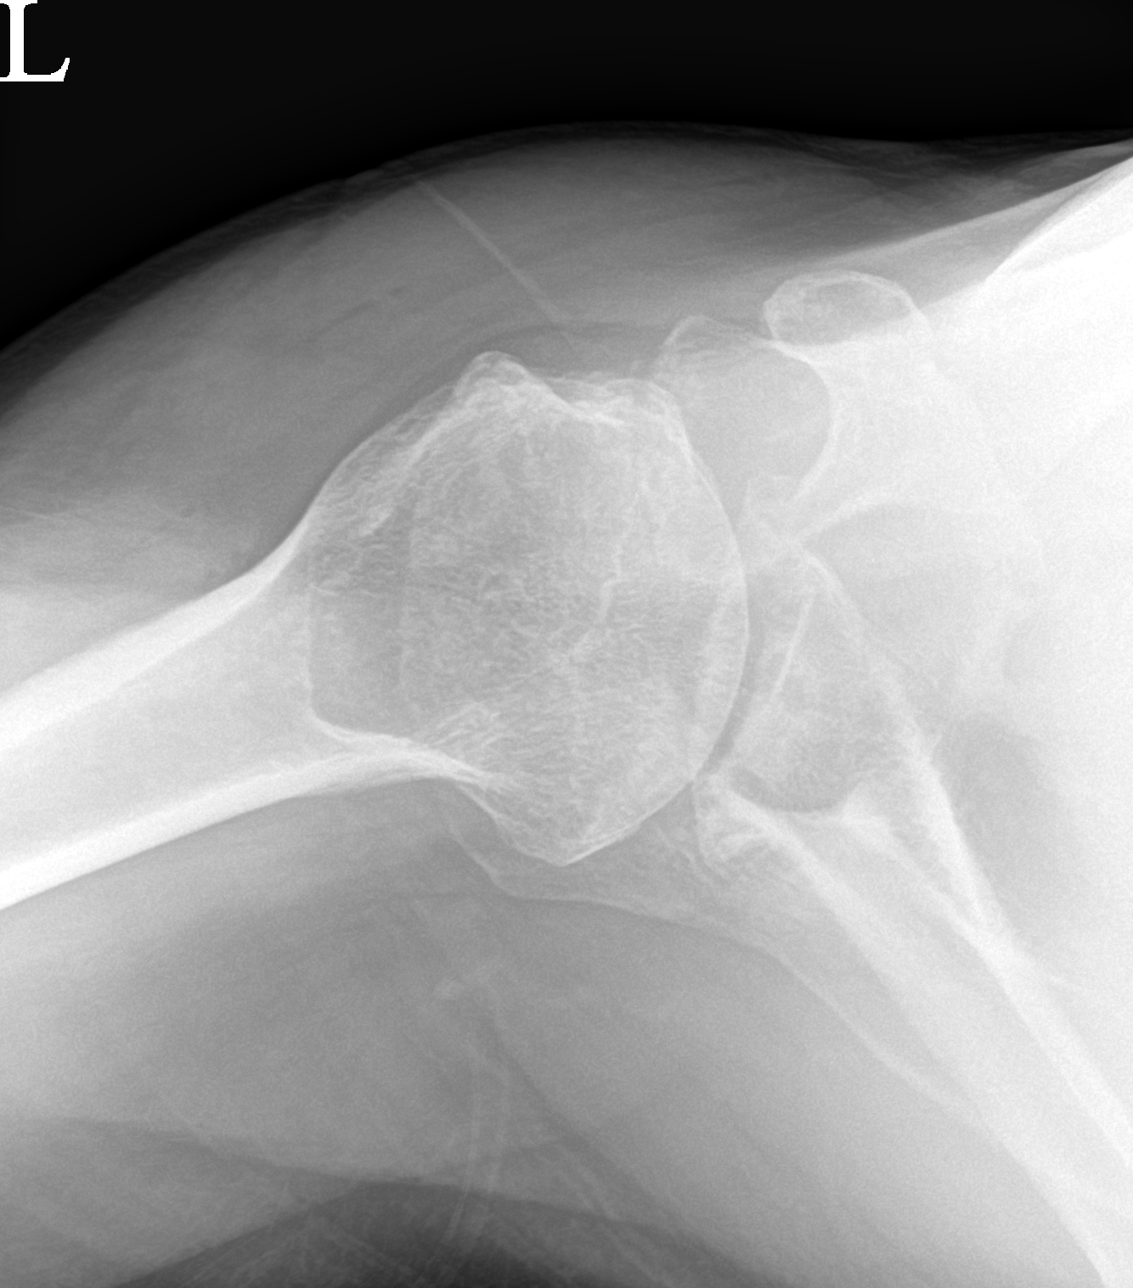

[3 of 3 positions shown; findings below may reference images not displayed]

FINDINGS: There is no acute fracture or dislocation. The bones are osteopenic.
There is degenerative changes of the left shoulder with
osteophyte/spurring of the inferior medial humeral head. There is
associated narrowing of the inferior glenohumeral joint. The soft
tissues are unremarkable.
IMPRESSION: 1. No acute fracture or dislocation.
2. Degenerative changes.

## 2023-09-05 ENCOUNTER — Other Ambulatory Visit: Payer: Self-pay | Admitting: Emergency Medicine

## 2023-09-05 DIAGNOSIS — G4700 Insomnia, unspecified: Secondary | ICD-10-CM

## 2023-09-27 ENCOUNTER — Ambulatory Visit

## 2023-09-27 VITALS — Ht 70.0 in | Wt 200.0 lb

## 2023-09-27 DIAGNOSIS — Z0000 Encounter for general adult medical examination without abnormal findings: Secondary | ICD-10-CM

## 2023-09-27 NOTE — Patient Instructions (Signed)
 Allen Burnett , Thank you for taking time out of your busy schedule to complete your Annual Wellness Visit with me. I enjoyed our conversation and look forward to speaking with you again next year. I, as well as your care team,  appreciate your ongoing commitment to your health goals. Please review the following plan we discussed and let me know if I can assist you in the future. Your Game plan/ To Do List    Follow up Visits: We will see or speak with you next year for your Next Medicare AWV with our clinical staff Have you seen your provider in the last 6 months (3 months if uncontrolled diabetes)? Yes. Last office visit on 04/30/2023.  Clinician Recommendations:  Aim for 30 minutes of exercise or brisk walking, 6-8 glasses of water , and 5 servings of fruits and vegetables each day. You are due for a pneumonia vaccine.  Keep up the good work.      This is a list of the screenings recommended for you:  Health Maintenance  Topic Date Due   Zoster (Shingles) Vaccine (1 of 2) Never done   Pneumococcal Vaccine for age over 45 (3 of 3 - PCV20 or PCV21) 03/15/2021   COVID-19 Vaccine (2 - Mixed Product risk series) 12/08/2022   Flu Shot  09/07/2023   Colon Cancer Screening  08/29/2024   Medicare Annual Wellness Visit  09/26/2024   Hepatitis C Screening  Completed   HPV Vaccine  Aged Out   Meningitis B Vaccine  Aged Out   DTaP/Tdap/Td vaccine  Discontinued    Advanced directives: (Copy Requested) Please bring a copy of your health care power of attorney and living will to the office to be added to your chart at your convenience. You can mail to [facility] [address] [place], [place] [address] or email to [email or web address] Advance Care Planning is important because it:  [x]  Makes sure you receive the medical care that is consistent with your values, goals, and preferences  [x]  It provides guidance to your family and loved ones and reduces their decisional burden  about whether or not they are making the right decisions based on your wishes.  Follow the link provided in your after visit summary or read over the paperwork we have mailed to you to help you started getting your Advance Directives in place. If you need assistance in completing these, please reach out to us  so that we can help you!  See attachments for Preventive Care and Fall Prevention Tips.

## 2023-09-27 NOTE — Progress Notes (Signed)
 Subjective:   Allen Burnett is a 73 y.o. who presents for a Medicare Wellness preventive visit.  As a reminder, Annual Wellness Visits don't include a physical exam, and some assessments may be limited, especially if this visit is performed virtually. We may recommend an in-person follow-up visit with your provider if needed.  Visit Complete: Virtual I connected with  Allen Burnett on 09/27/23 by a audio enabled telemedicine application and verified that I am speaking with the correct person using two identifiers.  Patient Location: Home  Provider Location: Home Office  I discussed the limitations of evaluation and management by telemedicine. The patient expressed understanding and agreed to proceed.  Vital Signs: Because this visit was a virtual/telehealth visit, some criteria may be missing or patient reported. Any vitals not documented were not able to be obtained and vitals that have been documented are patient reported.  VideoDeclined- This patient declined Librarian, academic. Therefore the visit was completed with audio only.  Persons Participating in Visit: Patient.  AWV Questionnaire: No: Patient Medicare AWV questionnaire was not completed prior to this visit.  Cardiac Risk Factors include: advanced age (>73men, >23 women);male gender;dyslipidemia;Other (see comment), Risk factor comments: Prostate cancer     Objective:    Today's Vitals   09/27/23 0815  Weight: 200 lb (90.7 kg)  Height: 5' 10 (1.778 m)   Body mass index is 28.7 kg/m.     09/27/2023    8:20 AM 08/31/2022    6:38 AM 06/05/2022    9:53 AM 03/15/2016    8:15 AM 12/28/2011    7:18 AM  Advanced Directives  Does Patient Have a Medical Advance Directive? Yes No Yes No  Patient would not like information   Type of Advance Directive Healthcare Power of [place];Living will  Healthcare Power of [place];Living will    Copy of Healthcare Power of Attorney in Chart? No - copy  requested  No - copy requested    Would patient like information on creating a medical advance directive?  No - Patient declined     Pre-existing out of facility DNR order (yellow form or pink MOST form)     No      Data saved with a previous flowsheet row definition    Current Medications (verified) Outpatient Encounter Medications as of 09/27/2023  Medication Sig   ALPRAZolam  (XANAX ) 0.5 MG tablet TAKE 1 TABLET BY MOUTH 3 TIMES A DAY AS NEEDED FOR ANXIETY   diclofenac (VOLTAREN) 75 MG EC tablet Take 75 mg by mouth. 4 times a week   FLUoxetine  (PROZAC ) 20 MG capsule Take 3 capsules (60 mg total) by mouth daily.   Melatonin 3-10 MG TABS Take by mouth.   Multiple Vitamin (MULTIVITAMIN WITH MINERALS) TABS tablet Take 1 tablet by mouth daily.   rosuvastatin  (CRESTOR ) 10 MG tablet TAKE 1 TABLET BY MOUTH DAILY   zolpidem  (AMBIEN ) 10 MG tablet TAKE 1 TABLET BY MOUTH EVERY NIGHT AT BEDTIME AS NEEDED FOR SLEEP   No facility-administered encounter medications on file as of 09/27/2023.    Allergies (verified) Patient has no known allergies.   History: Past Medical History:  Diagnosis Date   Acid reflux OCCASIONALLY--  TAKE PREVACID   Atypical nevus 02/20/2006   slight to moderate - right paraspinal, lower back   Atypical nevus 10/01/2013   moderate to severe - upper paraspinal (WS)   Atypical nevus 10/01/2013   moderate - right upper paraspinal (WS)   Atypical nevus 04/27/2016   moderate -  right upper back   Basal cell carcinoma 02/20/2006   left medial chest   Basal cell carcinoma 11/05/2013   right upper paraspinal (recheck in 3 months)   Chronic cluster [facility]   PRN OXYGEN VIA [facility] WHEN FEELS HEADACHE STARTING/  ON PREVENTION MEDS   Halo nevus 03/30/2016   moderate - right upper back (WS)   Hx of radiation therapy 12/28/11   prostate seed implant/75 seeds/,Dr.Dahlstedt   Hyperlipidemia    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   right angle of jaw  - excision   Melanoma (HCC) 02/20/2006   left lateral chest - excision   Melanoma in situ (HCC) 07/27/2009   right lobe of ear   Melanoma in situ (HCC) 10/01/2013   left jawline (MOHs)   Melanoma in situ (HCC) 10/01/2013   left cheek (MOHs)   Prostate cancer (HCC) DX  10/11/11   Adenocarcinoma,gleason 3+3=6,PSA=5.39   volume=30cc   PSA elevation    2009=3.4,2010=3.46,2011=3.16,2012=3.76,07/2011=4.85   SCCA (squamous cell carcinoma) of skin 03/03/2020   Right Lower Shin (Keratoacanthoma)   SCCA (squamous cell carcinoma) of skin 03/03/2020   Right Dorsal Hand (in situ)   Squamous cell carcinoma of dorsum of right hand 08/18/1997   CX3 + 5FU   Squamous cell carcinoma of skin 10/26/2015   Right thigh, proximal   Squamous cell carcinoma, face 10/01/2013   Right forehead - CX3 + 5FU   Squamous cell carcinoma, face 11/15/2017   Right bulb nose - tx p bx   Past Surgical History:  Procedure Laterality Date   APPENDECTOMY  AGE 70   COLONOSCOPY     SEVERAL AT [facility] FOR [facility], NO POLYPS PER PT   PROSTATE BIOPSY  10/11/2011   gleason 3+3=6,PSA=5.39,Adenocarcinoma   PROSTATE SURGERY     RADIOACTIVE SEED IMPLANT  12/28/2011   Procedure: RADIOACTIVE SEED IMPLANT;  Surgeon: Garnette Shack, MD;  Location: [facility];  Service: Urology;  Laterality: N/A;  75 seeds implanted  no seeds found in bladder   Family History  Problem Relation Age of Onset   Hyperlipidemia Mother    Colon polyps Father    Colon cancer Brother 86   Cancer Brother 67       colon cancer now age 70   Heart disease Maternal Grandfather    Esophageal cancer Neg Hx    Rectal cancer Neg Hx    Stomach cancer Neg Hx    Social History   Socioeconomic History   Marital status: Married    Spouse name: Michaelle Caldron   Number of children: 2   Years of education: Not on file   Highest education level: Bachelor's degree (e.g., BA, AB, BS)  Occupational History    Comment: advertising sales/ retired    Occupation: golf course and gym    Comment: Part time   Occupation: RETIRED  Tobacco Use   Smoking status: Former    Current packs/day: 0.00    Average packs/day: 0.3 packs/day for 10.0 years (2.5 ttl pk-yrs)    Types: Cigarettes    Start date: 07/25/1974    Quit date: 07/24/1984    Years since quitting: 39.2   Smokeless tobacco: Former    Types: Chew   Tobacco comments:    light smoker... quit 40 yrs ago  Vaping Use   Vaping status: Never Used  Substance and Sexual Activity   Alcohol use: Yes    Alcohol/week: 6.0 - 8.0 standard drinks of alcohol    Types:  6 - 8 Standard drinks or equivalent per week    Comment: beer on weekends   Drug use: No   Sexual activity: Yes    Partners: Female  Other Topics Concern   Not on file  Social History Narrative   Worked in [employer] for 35 years. 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       Born and lived in [place] for first 8 years. Then moved to [place]. Graduated from [employer].       Caffeine-1 coffee, occas soft drink.    Legal-none   Religion-Catholic      Lives with wife/2025   Social Drivers of Health   Financial Resource Strain: Low Risk  (09/27/2023)   Overall Financial Resource Strain (CARDIA)    Difficulty of Paying Living Expenses: Not hard at all  Food Insecurity: No Food Insecurity (09/27/2023)   Hunger Vital Sign    Worried About Running Out of Food in the Last Year: Never true    Ran Out of Food in the Last Year: Never true  Transportation Needs: No Transportation Needs (09/27/2023)   PRAPARE - Administrator, Civil Service (Medical): No    Lack of Transportation (Non-Medical): No  Physical Activity: Sufficiently Active (09/27/2023)   Exercise Vital Sign    Days of Exercise per Week: 5 days    Minutes of Exercise per Session: 40 min  Stress: No Stress Concern Present (09/27/2023)   [employer] of Occupational Health - Occupational Stress Questionnaire    Feeling of  Stress: Not at all  Social Connections: Socially Integrated (09/27/2023)   Social Connection and Isolation Panel    Frequency of Communication with Friends and Family: More than three times a week    Frequency of Social Gatherings with Friends and Family: Twice a week    Attends Religious Services: More than 4 times per year    Active Member of [employer] or Organizations: Yes    Attends Banker Meetings: 1 to 4 times per year    Marital Status: Married    Tobacco Counseling Counseling given: Not Answered Tobacco comments: light smoker... quit 40 yrs ago    Clinical Intake:  Pre-visit preparation completed: Yes  Pain : No/denies pain     BMI - recorded: 28.7 Nutritional Status: BMI 25 -29 Overweight Nutritional Risks: None Diabetes: No  Lab Results  Component Value Date   HGBA1C 5.6 04/30/2023   HGBA1C 5.8 04/17/2022     How often do you need to have someone help you when you read instructions, pamphlets, or other written materials from your doctor or pharmacy?: 1 - Never  Interpreter Needed?: No  Information entered by :: Tatijana Bierly, RMA   Activities of Daily Living     09/27/2023    8:17 AM  In your present state of health, do you have any difficulty performing the following activities:  Hearing? 0  Vision? 0  Difficulty concentrating or making decisions? 0  Walking or climbing stairs? 0  Dressing or bathing? 0  Doing errands, shopping? 0  Preparing Food and eating ? N  Using the Toilet? N  In the past six months, have you accidently leaked urine? N  Do you have problems with loss of bowel control? N  Managing your Medications? N  Managing your Finances? N  Housekeeping or managing your Housekeeping? N    Patient Care Team: Purcell Emil Schanz, MD as PCP - General (Internal Medicine) Sheffield, Kelli  R, PA-C (Inactive) as Physician Assistant (Dermatology) Raelyn Harlene DEL, OD as Consulting Physician (Optometry)  I have updated your Care  Teams any recent Medical Services you may have received from other providers in the past year.     Assessment:   This is a routine wellness examination for Allen Burnett.  Hearing/Vision screen Hearing Screening - Comments:: Denies hearing difficulties   Vision Screening - Comments:: Wears eyeglasses/[facility]   Goals Addressed             This Visit's Progress    My goal for 2024 is to maintain my health, stay active and enjoy life to the fullest.   On track      Depression Screen     09/27/2023    8:23 AM 04/30/2023    8:09 AM 12/28/2022    7:32 AM 06/05/2022    9:56 AM 04/24/2022   10:51 AM 10/12/2021    3:33 PM 07/01/2021   10:52 AM  PHQ 2/9 Scores  PHQ - 2 Score 0 0  0 0 0 0  PHQ- 9 Score 1           Information is confidential and restricted. Go to Review Flowsheets to unlock data.    Fall Risk     09/27/2023    8:21 AM 04/30/2023    8:09 AM 06/05/2022    9:54 AM 06/03/2022    8:17 AM 04/24/2022   10:51 AM  Fall Risk   Falls in the past year? 0 0 0 0 0  Number falls in past yr: 0 0 0  0  Injury with Fall? 0 0 0 0 0  Risk for fall due to : No Fall Risks No Fall Risks No Fall Risks  No Fall Risks  Follow up Falls evaluation completed;Falls prevention discussed Falls evaluation completed Falls prevention discussed  Falls evaluation completed    MEDICARE RISK AT HOME:  Medicare Risk at Home Any stairs in or around the home?: Yes If so, are there any without handrails?: No Home free of loose throw rugs in walkways, pet beds, electrical cords, etc?: Yes Adequate lighting in your home to reduce risk of falls?: Yes Life alert?: No Use of a cane, walker or w/c?: No Grab bars in the bathroom?: No Shower chair or bench in shower?: No Elevated toilet seat or a handicapped toilet?: No  TIMED UP AND GO:  Was the test performed?  No  Cognitive Function: Declined/Normal: No cognitive concerns noted by patient or family. Patient alert, oriented, able to answer questions  appropriately and recall recent events. No signs of memory loss or confusion.        06/05/2022   10:00 AM 07/01/2021   10:54 AM 04/09/2018    8:33 AM  6CIT Screen  What Year? 0 points 0 points 0 points  What month? 0 points 0 points 0 points  What time? 0 points 0 points 0 points  Count back from 20 0 points 0 points 0 points  Months in reverse 0 points 0 points 0 points  Repeat phrase 0 points 0 points 0 points  Total Score 0 points 0 points 0 points    Immunizations Immunization History  Administered Date(s) Administered   Fluad Quad(high Dose 65+) 10/12/2021   Influenza, High Dose Seasonal PF 10/20/2018   Influenza-Unspecified 12/06/2015, 12/13/2016, 11/10/2022   Pneumococcal Conjugate-13 10/29/2013   Pneumococcal Polysaccharide-23 03/15/2016   Unspecified SARS-COV-2 Vaccination 11/10/2022    Screening Tests Health Maintenance  Topic Date Due  Zoster Vaccines- Shingrix (1 of 2) Never done   Pneumococcal Vaccine: 50+ Years (3 of 3 - PCV20 or PCV21) 03/15/2021   COVID-19 Vaccine (2 - Mixed Product risk series) 12/08/2022   INFLUENZA VACCINE  09/07/2023   Colonoscopy  08/29/2024   Medicare Annual Wellness (AWV)  09/26/2024   Hepatitis C Screening  Completed   HPV VACCINES  Aged Out   Meningococcal B Vaccine  Aged Out   DTaP/Tdap/Td  Discontinued    Health Maintenance  Health Maintenance Due  Topic Date Due   Zoster Vaccines- Shingrix (1 of 2) Never done   Pneumococcal Vaccine: 50+ Years (3 of 3 - PCV20 or PCV21) 03/15/2021   COVID-19 Vaccine (2 - Mixed Product risk series) 12/08/2022   INFLUENZA VACCINE  09/07/2023   Health Maintenance Items Addressed: See Nurse Notes at the end of this note  Additional Screening:  Vision Screening: Recommended annual ophthalmology exams for early detection of glaucoma and other disorders of the eye. Would you like a referral to an eye doctor? No    Dental Screening: Recommended annual dental exams for proper oral  hygiene  Community Resource Referral / Chronic Care Management: CRR required this visit?  No   CCM required this visit?  No   Plan:    I have personally reviewed and noted the following in the patient's chart:   Medical and social history Use of alcohol, tobacco or illicit drugs  Current medications and supplements including opioid prescriptions. Patient is not currently taking opioid prescriptions. Functional ability and status Nutritional status Physical activity Advanced directives List of other physicians Hospitalizations, surgeries, and ER visits in previous 12 months Vitals Screenings to include cognitive, depression, and falls Referrals and appointments  In addition, I have reviewed and discussed with patient certain preventive protocols, quality metrics, and best practice recommendations. A written personalized care plan for preventive services as well as general preventive health recommendations were provided to patient.   Emilija Bohman L Daryn Pisani, CMA   09/27/2023   After Visit Summary: (MyChart) Due to this being a telephonic visit, the after visit summary with patients personalized plan was offered to patient via MyChart   Notes: Patient is due for a pneumonia vaccine.  He declines the Shingrix vaccine for now.  Patient had no other concerns to address today.

## 2023-10-02 ENCOUNTER — Other Ambulatory Visit: Payer: Self-pay | Admitting: Physician Assistant

## 2023-10-02 DIAGNOSIS — F411 Generalized anxiety disorder: Secondary | ICD-10-CM

## 2023-10-12 ENCOUNTER — Other Ambulatory Visit: Payer: Self-pay | Admitting: Physician Assistant

## 2023-10-24 ENCOUNTER — Ambulatory Visit: Admitting: Physician Assistant

## 2023-10-24 ENCOUNTER — Encounter: Payer: Self-pay | Admitting: Physician Assistant

## 2023-10-24 DIAGNOSIS — F411 Generalized anxiety disorder: Secondary | ICD-10-CM

## 2023-10-24 DIAGNOSIS — F32A Depression, unspecified: Secondary | ICD-10-CM | POA: Diagnosis not present

## 2023-10-24 DIAGNOSIS — G4700 Insomnia, unspecified: Secondary | ICD-10-CM

## 2023-10-24 MED ORDER — FLUOXETINE HCL 20 MG PO CAPS: 60.0000 mg | ORAL_CAPSULE | Freq: Every day | ORAL | 1 refills | Status: AC

## 2023-10-24 NOTE — Progress Notes (Signed)
 [facility] Med Check  Patient ID: Allen Burnett,  MRN: [id number]  PCP: Purcell Emil Schanz, MD  Date of Evaluation: 10/24/2023 Time spent:20 minutes  Chief Complaint:   HISTORY/CURRENT STATUS: HPI for routine med check.  Doing well.  Energy and motivation are good. Still enjoys playing golf.  Works out daily.   No extreme sadness, tearfulness, or feelings of hopelessness.  Sleeps well, takes Ambien  sometimes. ADLs and personal hygiene are normal.   Denies any changes in concentration, making decisions, or remembering things.  Appetite has not changed.  Weight is stable.  Takes Xanax  occas and it is effective but causes drowsiness.  No mania, delirium, AH/VH.  No SI/HI.  Individual Medical History/ Review of Systems: Changes? :No   Past medications for mental health diagnoses include: Ambien , Prozac  when son died several years ago.   Gabapentin  for cluster migraines  Allergies: Patient has no known allergies.  Current Medications:  Current Outpatient Medications:    FLUoxetine  (PROZAC ) 20 MG capsule, TAKE 3 CAPSULES BY MOUTH DAILY, Disp: 60 capsule, Rfl: 0   ALPRAZolam  (XANAX ) 0.5 MG tablet, TAKE 1 TABLET BY MOUTH 3 TIMES A DAY AS NEEDED FOR ANXIETY, Disp: 60 tablet, Rfl: 0   diclofenac (VOLTAREN) 75 MG EC tablet, Take 75 mg by mouth. 4 times a week, Disp: , Rfl:    Melatonin 3-10 MG TABS, Take by mouth., Disp: , Rfl:    Multiple Vitamin (MULTIVITAMIN WITH MINERALS) TABS tablet, Take 1 tablet by mouth daily., Disp: , Rfl:    rosuvastatin  (CRESTOR ) 10 MG tablet, TAKE 1 TABLET BY MOUTH DAILY, Disp: 90 tablet, Rfl: 3   zolpidem  (AMBIEN ) 10 MG tablet, TAKE 1 TABLET BY MOUTH EVERY NIGHT AT BEDTIME AS NEEDED FOR SLEEP, Disp: 20 tablet, Rfl: 1 Medication Side Effects: none  Family Medical/ Social History: Changes? No  MENTAL HEALTH EXAM:  There were no vitals taken for this visit.There is no height or weight on file to calculate BMI.  General Appearance: Casual and Well Groomed   Eye Contact:  Good  Speech:  Clear and Coherent and Normal Rate  Volume:  Normal  Mood:  Euthymic  Affect:  Congruent  Thought Process:  Goal Directed and Descriptions of Associations: Circumstantial  Orientation:  Full (Time, Place, and Person)  Thought Content: Logical   Suicidal Thoughts:  No  Homicidal Thoughts:  No  Memory:  WNL  Judgement:  Good  Insight:  Good  Psychomotor Activity:  Normal  Concentration:  Concentration: Good and Attention Span: Good  Recall:  Good  Fund of Knowledge: Good  Language: Good  Assets:  Communication Skills Desire for Improvement Financial Resources/Insurance Housing Physical Health Resilience Transportation  ADL's:  Intact  Cognition: WNL  Prognosis:  Good   DIAGNOSES:    ICD-10-CM   1. Generalized anxiety disorder  F41.1     2. Mild depression  F32.A     3. Insomnia, unspecified type  G47.00      Receiving Psychotherapy: No   RECOMMENDATIONS:   PDMP reviewed.  Ambien  filled 09/05/2023.  Xanax  filled 10/03/2023. I provided approximately 20  minutes of face to face time during this encounter, including time spent before and after the visit in records review, medical decision making, counseling pertinent to today's visit, and charting.   Lytle is doing well on Prozac  so no changes need to be made.   Continue Xanax  0.5 mg, 1 p.o. 3 times daily as needed anxiety or sleep. Coninue Prozac  60 mg daily. (He sometimes only takes  20-40 mg, feels like he doesn't need the 60 mg plus it may cause insomnia. It's ok to do this.) Continue Ambien  10 mg, 1 p.o. nightly as needed.  Do not take with Xanax . Return in 6 months.  Verneita Cooks, PA-C

## 2023-11-12 DIAGNOSIS — D485 Neoplasm of uncertain behavior of skin: Secondary | ICD-10-CM | POA: Diagnosis not present

## 2023-11-12 DIAGNOSIS — Z85828 Personal history of other malignant neoplasm of skin: Secondary | ICD-10-CM | POA: Diagnosis not present

## 2023-11-12 DIAGNOSIS — D1801 Hemangioma of skin and subcutaneous tissue: Secondary | ICD-10-CM | POA: Diagnosis not present

## 2023-11-12 DIAGNOSIS — Z08 Encounter for follow-up examination after completed treatment for malignant neoplasm: Secondary | ICD-10-CM | POA: Diagnosis not present

## 2023-11-12 DIAGNOSIS — L814 Other melanin hyperpigmentation: Secondary | ICD-10-CM | POA: Diagnosis not present

## 2023-11-12 DIAGNOSIS — L821 Other seborrheic keratosis: Secondary | ICD-10-CM | POA: Diagnosis not present

## 2023-11-12 DIAGNOSIS — D0339 Melanoma in situ of other parts of face: Secondary | ICD-10-CM | POA: Diagnosis not present

## 2023-11-15 DIAGNOSIS — D485 Neoplasm of uncertain behavior of skin: Secondary | ICD-10-CM | POA: Diagnosis not present

## 2023-11-15 DIAGNOSIS — D0439 Carcinoma in situ of skin of other parts of face: Secondary | ICD-10-CM | POA: Diagnosis not present

## 2023-11-15 DIAGNOSIS — L814 Other melanin hyperpigmentation: Secondary | ICD-10-CM | POA: Diagnosis not present

## 2023-11-17 ENCOUNTER — Other Ambulatory Visit: Payer: Self-pay | Admitting: Physician Assistant

## 2023-11-17 DIAGNOSIS — F411 Generalized anxiety disorder: Secondary | ICD-10-CM

## 2023-11-27 ENCOUNTER — Other Ambulatory Visit: Payer: Self-pay | Admitting: Emergency Medicine

## 2023-11-27 DIAGNOSIS — G4700 Insomnia, unspecified: Secondary | ICD-10-CM

## 2023-12-17 DIAGNOSIS — D0339 Melanoma in situ of other parts of face: Secondary | ICD-10-CM | POA: Diagnosis not present

## 2023-12-26 DIAGNOSIS — H2513 Age-related nuclear cataract, bilateral: Secondary | ICD-10-CM | POA: Diagnosis not present

## 2024-01-25 ENCOUNTER — Emergency Department (HOSPITAL_BASED_OUTPATIENT_CLINIC_OR_DEPARTMENT_OTHER)

## 2024-01-25 ENCOUNTER — Emergency Department (HOSPITAL_BASED_OUTPATIENT_CLINIC_OR_DEPARTMENT_OTHER)
Admission: EM | Admit: 2024-01-25 | Discharge: 2024-01-25 | Disposition: A | Discharge status: Home / Self Care | Attending: Emergency Medicine | Admitting: Emergency Medicine

## 2024-01-25 ENCOUNTER — Other Ambulatory Visit: Payer: Self-pay

## 2024-01-25 ENCOUNTER — Ambulatory Visit: Payer: Self-pay

## 2024-01-25 ENCOUNTER — Encounter (HOSPITAL_BASED_OUTPATIENT_CLINIC_OR_DEPARTMENT_OTHER): Payer: Self-pay | Admitting: Emergency Medicine

## 2024-01-25 DIAGNOSIS — S2242XA Multiple fractures of ribs, left side, initial encounter for closed fracture: Secondary | ICD-10-CM | POA: Insufficient documentation

## 2024-01-25 DIAGNOSIS — W08XXXA Fall from other furniture, initial encounter: Secondary | ICD-10-CM | POA: Insufficient documentation

## 2024-01-25 DIAGNOSIS — M4802 Spinal stenosis, cervical region: Secondary | ICD-10-CM | POA: Insufficient documentation

## 2024-01-25 DIAGNOSIS — S0990XA Unspecified injury of head, initial encounter: Secondary | ICD-10-CM | POA: Diagnosis present

## 2024-01-25 DIAGNOSIS — Y92019 Unspecified place in single-family (private) house as the place of occurrence of the external cause: Secondary | ICD-10-CM | POA: Diagnosis not present

## 2024-01-25 DIAGNOSIS — S299XXA Unspecified injury of thorax, initial encounter: Secondary | ICD-10-CM | POA: Diagnosis present

## 2024-01-25 MED ORDER — OXYCODONE-ACETAMINOPHEN 5-325 MG PO TABS: 1.0000 | ORAL_TABLET | Freq: Three times a day (TID) | ORAL | 0 refills | Status: DC | PRN

## 2024-01-25 MED ORDER — OXYCODONE-ACETAMINOPHEN 5-325 MG PO TABS: 1.0000 | ORAL_TABLET | ORAL | Status: DC | PRN

## 2024-01-25 MED ORDER — ACETAMINOPHEN 500 MG PO TABS
500.0000 mg | ORAL_TABLET | Freq: Once | ORAL | Status: AC
  Administered 2024-01-25: 500 mg via ORAL
  Filled 2024-01-25: qty 1

## 2024-01-25 MED ORDER — IBUPROFEN 800 MG PO TABS
800.0000 mg | ORAL_TABLET | Freq: Once | ORAL | Status: AC
  Administered 2024-01-25: 800 mg via ORAL
  Filled 2024-01-25: qty 1

## 2024-01-25 MED ORDER — OXYCODONE-ACETAMINOPHEN 5-325 MG PO TABS
1.0000 | ORAL_TABLET | Freq: Once | ORAL | Status: AC
  Administered 2024-01-25: 1 via ORAL
  Filled 2024-01-25: qty 1

## 2024-01-25 NOTE — ED Provider Notes (Signed)
 " Allen Burnett EMERGENCY DEPARTMENT AT [facility] Provider Note   CSN: [id number] Arrival date & time: 01/25/24  1806     Patient presents with: Allen Burnett   Allen Burnett is a 73 y.o. male.  Who presents to ED after fall.  Patient was standing on a stepstool approximately 2 feet off of the ground when he lost his balance fell backwards striking left side of his posterior ribs on a wooden chair.  Now with significant pain.  Denies head trauma loss of consciousness.  No anticoagulation or other injuries    Fall       Prior to Admission medications  Medication Sig Start Date End Date Taking? Authorizing Provider  oxyCODONE -acetaminophen  (PERCOCET/ROXICET) 5-325 MG tablet Take 1 tablet by mouth every 8 (eight) hours as needed for up to 5 days for severe pain (pain score 7-10). 01/25/24 01/30/24 Yes Pamella Sharper A, DO  ALPRAZolam  (XANAX ) 0.5 MG tablet TAKE 1 TABLET BY MOUTH 3 TIMES A DAY AS NEEDED FOR ANXIETY 11/19/23   Rhys, Verneita DASEN, PA-C  diclofenac (VOLTAREN) 75 MG EC tablet Take 75 mg by mouth. 4 times a week 03/23/22   [provider]  FLUoxetine  (PROZAC ) 20 MG capsule Take 3 capsules (60 mg total) by mouth daily. 10/24/23   Rhys Verneita DASEN, PA-C  Melatonin 3-10 MG TABS Take by mouth.    [provider]  Multiple Vitamin (MULTIVITAMIN WITH MINERALS) TABS tablet Take 1 tablet by mouth daily.    [provider]  rosuvastatin  (CRESTOR ) 10 MG tablet TAKE 1 TABLET BY MOUTH DAILY 05/30/23   Purcell Emil Schanz, MD  zolpidem  (AMBIEN ) 10 MG tablet TAKE 1 TABLET BY MOUTH EVERY NIGHT AT BEDTIME AS NEEDED FOR SLEEP 11/27/23   Purcell Emil Schanz, MD    Allergies: Patient has no known allergies.    Review of Systems  Updated Vital Signs BP 123/89 (BP Location: Right Arm)   Pulse 80   Temp 97.6 F (36.4 C)   Resp 18   SpO2 100%   Physical Exam Vitals and nursing note reviewed.  HENT:     Head: Normocephalic and atraumatic.  Eyes:     Pupils: Pupils  are equal, round, and reactive to light.  Cardiovascular:     Rate and Rhythm: Normal rate and regular rhythm.  Pulmonary:     Effort: Pulmonary effort is normal.     Breath sounds: Normal breath sounds.  Abdominal:     Palpations: Abdomen is soft.     Tenderness: There is no abdominal tenderness.  Musculoskeletal:        General: No tenderness.     Cervical back: Neck supple. No tenderness.     Comments: Left lateral and posterior chest wall tenderness without deformity or flail segment No midline tenderness step-off deformity back  Skin:    General: Skin is warm and dry.  Neurological:     General: No focal deficit present.     Mental Status: He is alert.  Psychiatric:        Mood and Affect: Mood normal.     (all labs ordered are listed, but only abnormal results are displayed) Labs Reviewed - No data to display  EKG: None  Radiology: CT Chest Wo Contrast Result Date: 01/25/2024 EXAM: CT CHEST WITHOUT CONTRAST 01/25/2024 07:37:18 PM TECHNIQUE: CT of the chest was performed without the administration of intravenous contrast. Multiplanar reformatted images are provided for review. Automated exposure control, iterative reconstruction, and/or weight based adjustment of the mA/kV  was utilized to reduce the radiation dose to as low as reasonably achievable. COMPARISON: Comparison with 01/25/2024 x-ray. CLINICAL HISTORY: L rib fx ?hematoma. FINDINGS: MEDIASTINUM: Heart and pericardium are unremarkable. The central airways are clear. LYMPH NODES: No mediastinal, hilar or axillary lymphadenopathy. LUNGS AND PLEURA: No focal consolidation or pulmonary edema. No pleural effusion or pneumothorax. SOFT TISSUES/BONES: Mildly displaced fractures of the left posterior 5th-8th ribs. Mild adjacent pleural thickening or fluid without large discrete hematoma. UPPER ABDOMEN: Limited images of the upper abdomen demonstrates no acute abnormality. IMPRESSION: 1. Mildly displaced fractures of the left  posterior 5th-8th ribs. 2. No significant pleural hematoma. Electronically signed by: Norman Gatlin MD 01/25/2024 07:57 PM EST RP Workstation: [id number]   DG Ribs Unilateral W/Chest Left Result Date: 01/25/2024 CLINICAL DATA:  Fall with left-sided rib pain EXAM: LEFT RIBS AND CHEST - 3+ VIEW COMPARISON:  Chest x-ray 12/01/2011 FINDINGS: Single-view chest demonstrates no pneumothorax. Normal cardiac size. Lobulated opacity along the left mid lateral thorax, question skin fold or soft tissue artifact as it is not seen on the additional rib series. Left rib series demonstrates acute displaced left fifth through eighth posterior rib fractures. IMPRESSION: 1. Acute displaced left fifth through eighth posterior rib fractures. No pneumothorax by radiography. 2. Lobulated opacity along the left mid lateral thorax, only on frontal view chest, question skin fold or soft tissue artifact as it is not seen on the additional rib series. Pleural hematoma could be considered as it projects over the left-sided rib fractures. Chest CT could be obtained for further assessment Electronically Signed   By: Luke Bun M.D.   On: 01/25/2024 19:21   CT Cervical Spine Wo Contrast Result Date: 01/25/2024 EXAM: CT CERVICAL SPINE WITHOUT CONTRAST 01/25/2024 06:38:00 PM TECHNIQUE: CT of the cervical spine was performed without the administration of intravenous contrast. Multiplanar reformatted images are provided for review. Automated exposure control, iterative reconstruction, and/or weight based adjustment of the mA/kV was utilized to reduce the radiation dose to as low as reasonably achievable. COMPARISON: MRI of the cervical spine 130/24. CLINICAL HISTORY: Neck trauma (Age >= 65y). Fall from a step stool. FINDINGS: BONES AND ALIGNMENT: No acute fracture or traumatic malalignment. Chronic straightening of the normal cervical lordosis is present. Slight retrolisthesis at C3-C4 and anterolisthesis at C4-C5 is stable. DEGENERATIVE  CHANGES: Marked loss of disc height at C6-C7 with uncovertebral spurring and moderate-to-severe right foraminal stenosis is stable. Moderate foraminal narrowing is present bilaterally at C3-C4 and worse on the right at C4-C5 due to asymmetric facet hypertrophy. Left foraminal narrowing at C6-C7 is severe. SOFT TISSUES: No prevertebral soft tissue swelling. Atherosclerotic calcifications are present at the carotid bifurcation bilaterally without definite stenosis. IMPRESSION: 1. No acute findings. 2. Stable moderate-to-severe right and severe left foraminal stenosis at C6-7 due to degenerative change. Electronically signed by: Lonni Necessary MD 01/25/2024 07:04 PM EST RP Workstation: [id number]   CT Maxillofacial Wo Contrast Result Date: 01/25/2024 EXAM: CT OF THE FACE WITHOUT CONTRAST 01/25/2024 06:38:00 PM TECHNIQUE: CT of the face was performed without the administration of intravenous contrast. Multiplanar reformatted images are provided for review. Automated exposure control, iterative reconstruction, and/or weight based adjustment of the mA/kV was utilized to reduce the radiation dose to as low as reasonably achievable. COMPARISON: None available. CLINICAL HISTORY: Facial trauma, blunt. Fall from a step stool. Abrasion over the right eye. FINDINGS: FACIAL BONES: No acute facial fracture. No mandibular dislocation. No suspicious bone lesion. ORBITS: Globes are intact. No acute traumatic injury. No inflammatory change. SINUSES  AND MASTOIDS: No acute abnormality. SOFT TISSUES: Abrasion over the right eye. IMPRESSION: 1. No acute facial fracture. Electronically signed by: Lonni Necessary MD 01/25/2024 07:02 PM EST RP Workstation: [id number]   CT Head Wo Contrast Result Date: 01/25/2024 EXAM: CT HEAD WITHOUT CONTRAST 01/25/2024 06:38:00 PM TECHNIQUE: CT of the head was performed without the administration of intravenous contrast. Automated exposure control, iterative reconstruction, and/or weight  based adjustment of the mA/kV was utilized to reduce the radiation dose to as low as reasonably achievable. COMPARISON: None available. CLINICAL HISTORY: Head trauma, minor (Age >= 65y). Fall from a 3-foot step stool. FINDINGS: BRAIN AND VENTRICLES: No acute hemorrhage. No evidence of acute infarct. No hydrocephalus. No extra-axial collection. No mass effect or midline shift. ORBITS: No acute abnormality. SINUSES: No acute abnormality. SOFT TISSUES AND SKULL: No acute soft tissue abnormality. No skull fracture. IMPRESSION: 1. No acute intracranial abnormality. Electronically signed by: Lonni Necessary MD 01/25/2024 07:00 PM EST RP Workstation: [id number]     Procedures   Medications Ordered in the ED  oxyCODONE -acetaminophen  (PERCOCET/ROXICET) 5-325 MG per tablet 1 tablet (1 tablet Oral Given 01/25/24 1939)  acetaminophen  (TYLENOL ) tablet 500 mg (500 mg Oral Given 01/25/24 1939)  ibuprofen  (ADVIL ) tablet 800 mg (800 mg Oral Given 01/25/24 2055)                                    Medical Decision Making 73 year old male with history as above presenting to ED after fall off a stepstool at home.  Struck left posterior lateral ribs.  Stable on room air.  No pneumothorax.  Chest x-ray shows broken ribs mildly displaced on the left ribs 5 through 8 question hematoma.  CT shows rib fractures but no hematoma contusion pneumothorax.  Patient stable on room air.  Reporting improvement in pain after Percocet.  Incentive spirometer over 2500.  Appropriate for discharge counseled him on symptomatic management of rib fractures will prescribe short course of Percocet and he will follow-up with his PCP  Amount and/or Complexity of Data Reviewed Radiology: ordered.  Risk OTC drugs. Prescription drug management.        Final diagnoses:  Closed fracture of multiple ribs of left side, initial encounter    ED Discharge Orders          Ordered    oxyCODONE -acetaminophen  (PERCOCET/ROXICET) 5-325 MG  tablet  Every 8 hours PRN        01/25/24 2105               Pamella Ozell LABOR, DO 01/25/24 2106  "

## 2024-01-25 NOTE — ED Notes (Signed)
 Patient transported to CT

## 2024-01-25 NOTE — Discharge Instructions (Addendum)
 You were seen in the emerged ferment for rib fractures There are 4 broken ribs on the left (ribs 5 through 8) There is no collapsed lung or bleeding We have called in a prescription for Percocet for you to pick up in your pharmacy and take as directed for severe pain Do not drink alcohol or drive while taking Percocet Take Tylenol  or ibuprofen  as directed for mild to moderate pain Use the incentive spirometer as discussed Follow-up with your doctor within 1 week for reevaluation Return to the emergency room for trouble breathing or any other concerns

## 2024-01-25 NOTE — ED Notes (Signed)
 Per EDP pt allowed to eat, pt given snack and water .

## 2024-01-25 NOTE — ED Notes (Signed)
 Incentive Spirometer placed at bedside

## 2024-01-25 NOTE — Telephone Encounter (Signed)
 FYI Only or Action Required?: FYI only for provider: Urgent Care or ED advised.  Patient was last seen in primary care on 04/30/2023 by Purcell Emil Schanz, MD.  Called Nurse Triage reporting Fall.  Symptoms began yesterday.  Interventions attempted: Rest, hydration, or home remedies.  Symptoms are: unchanged.  Triage Disposition: Go to ED Now (or PCP Triage)  Patient/caregiver understands and will follow disposition?: Yes  Copied from CRM #[id number]. Topic: Clinical - Red Word Triage >> Jan 25, 2024 12:26 PM Anairis L wrote: [facility] that prompted transfer to Nurse Triage: Clemens of stairs last night putting up lights. Chest Hurts believes ribs are cracked. Reason for Disposition  Patient sounds very sick or weak to the triager  Answer Assessment - Initial Assessment Questions Patient reports falling backwards on his left side from a 46ft ladder yesterday. Patient reports he feels like he might have cracked a rib. Recommended patient to UC or ED. Patient verbalized understanding.   1. MECHANISM: How did the fall happen?     Fall backwards on a 3 ft ladder 2. DOMESTIC VIOLENCE AND ELDER ABUSE SCREENING: Did you fall because someone pushed you or tried to hurt you? If Yes, ask: Are you safe now?     no 3. ONSET: When did the fall happen? (e.g., minutes, hours, or days ago)     Occurred last night while trying to put light ups 4. LOCATION: What part of the body hit the ground? (e.g., back, buttocks, head, hips, knees, hands, head, stomach)     Left side 5. INJURY: Did you hurt (injure) yourself when you fell? If Yes, ask: What did you injure? Tell me more about this? (e.g., body area; type of injury; pain severity)     Slight swelling to the left side 6. PAIN: Is there any pain? If Yes, ask: How bad is the pain? (e.g., Scale 0-10; or none, mild,      8 out of 10 7. SIZE: For cuts, bruises, or swelling, ask: How large is it? (e.g., inches or centimeters)       NA 9. OTHER SYMPTOMS: Do you have any other symptoms? (e.g., dizziness, fever, weakness; new-onset or worsening).      no 10. CAUSE: What do you think caused the fall (or falling)? (e.g., dizzy spell, tripped)       Lost balance  Protocols used: Falls and [facility]

## 2024-01-25 NOTE — ED Triage Notes (Signed)
 Just PTA: Fall from ~60ft step stool. Struck left rib on wooden chair. No bruising to area observed. Auscultation reveals crepitus of the lower left ribs. Small abrasion above right eye. Denies LOC, neck pain, other injury. No thinners.

## 2024-01-28 ENCOUNTER — Ambulatory Visit: Payer: Self-pay

## 2024-01-28 NOTE — Telephone Encounter (Signed)
 FYI Only or Action Required?: FYI only for provider: appointment scheduled on 01/29/2024 for ER f/u .  Patient was last seen in primary care on 04/30/2023 by Purcell Emil Schanz, MD.  Called Nurse Triage reporting Rib Injury and Fall.  Symptoms began several days ago.  Interventions attempted: OTC medications: IBP, Prescription medications: oxycodone /APAP, , and Other: ice .  Symptoms are: unchanged.  Triage Disposition: Go to ED Now (Notify PCP) ** seen at ER 1219 symptoms are the same. Patient appointment tomorrow.   Patient/caregiver understands and will follow disposition?: Yes   Reason for Disposition  SEVERE chest pain    Patient states the symptoms are the same since ER 1219 after fall  Protocols used: Chest Injury-A-AH Patient was seen ER 12/19 s/p fall off ladder was advised had broken ribs imaging done. Was sent home with oxycodone -APAP 5-325 states  doesn't do anything symptoms are the same since ER visit. Wanting something for pain control for the 4-5 day.  1. MECHANISM: How did the injury happen?     Fell when putting up decor fell 4-5 ft , seen at ER 12/19 had imaging  2. ONSET: When did the injury happen? (.e.g., minutes, hours, days ago)     12/19 when fell off ladder , symptoms are the same since ER visit and not worse and not better /improved  3. LOCATION: Where on the chest is the injury located?     Goes from back to sternum  6. SEVERITY: Any difficulty with breathing?    Shortness of breath when spikes with movement and resolves at rest when pain is moderate . Has been using IS device given by ER reports doing better than they expected and taking deep breaths with pain  8. PAIN: Is there pain? If Yes, ask: How bad is the pain? (e.g., Scale 0-10; none, mild, moderate, severe)     Rest 5-6/10 , movement out of bed 20  moves certain way can be 10 . At ER sent home with some oxycodone  APAP 5-325 mg doesn't do anything  Copied from CRM  #[id number]. Topic: Clinical - Red Word Triage >> Jan 28, 2024 11:48 AM Aleatha BROCKS wrote: Red Word that prompted transfer to Nurse Triage: Patient fell off later and broke ribs needs a prescription

## 2024-01-29 ENCOUNTER — Encounter: Payer: Self-pay | Admitting: Internal Medicine

## 2024-01-29 ENCOUNTER — Ambulatory Visit: Admitting: Internal Medicine

## 2024-01-29 VITALS — BP 124/82 | HR 65 | Temp 97.7°F | Ht 70.0 in | Wt 210.0 lb

## 2024-01-29 DIAGNOSIS — S2249XA Multiple fractures of ribs, unspecified side, initial encounter for closed fracture: Secondary | ICD-10-CM

## 2024-01-29 MED ORDER — OXYCODONE-ACETAMINOPHEN 7.5-325 MG PO TABS: 1.0000 | ORAL_TABLET | Freq: Four times a day (QID) | ORAL | 0 refills | Status: AC | PRN

## 2024-01-29 NOTE — Patient Instructions (Signed)
 Please take all new medication as prescribed - the percocet 7.5 325  Please continue all other medications as before, and refills have been done if requested.  Please have the pharmacy call with any other refills you may need.  Please keep your appointments with your specialists as you may have planned  No further xray or lab needed

## 2024-01-29 NOTE — Assessment & Plan Note (Signed)
 With significant pain but no other complications; no need fiurther imaging or lab today, but will need further pain control with percocet 7.5 325 mg prn,  to f/u any worsening symptoms or concerns

## 2024-01-29 NOTE — Progress Notes (Signed)
 Patient ID: Allen Burnett, male   DOB: Mar 09, 1950, 73 y.o.   MRN: [id number]        Chief Complaint: follow up fall with left rib fx x 4       HPI:  Allen Burnett is a 73 y.o. male here with c/o dec 19 accidental from a several foot ht and striking the left posterolat chest/ribs on the wooden chair.  Had immediate pain, but tried to see if it would resolved but went to ED 6 hrs later, with cxr, CT chest c/w left 5-8 rib fxs without displacement or hematoma.  Tx with percocet 5 325 mg but now out, and was not working well , even with addition of ibuprofen  400 mg prn.  But pain still 7-8/10.   Pt denies other chest pain, increased sob or doe, wheezing, orthopnea, PND, increased LE swelling, palpitations, dizziness or syncope.   Pt denies polydipsia, polyuria, or new focal neuro s/s.          Wt Readings from Last 3 Encounters:  01/29/24 210 lb (95.3 kg)  09/27/23 200 lb (90.7 kg)  07/04/23 200 lb (90.7 kg)   BP Readings from Last 3 Encounters:  01/29/24 124/82  01/25/24 130/87  07/04/23 122/86         Past Medical History:  Diagnosis Date   Acid reflux OCCASIONALLY--  TAKE PREVACID   Atypical nevus 02/20/2006   slight to moderate - right paraspinal, lower back   Atypical nevus 10/01/2013   moderate to severe - upper paraspinal (WS)   Atypical nevus 10/01/2013   moderate - right upper paraspinal (WS)   Atypical nevus 04/27/2016   moderate - right upper back   Basal cell carcinoma 02/20/2006   left medial chest   Basal cell carcinoma 11/05/2013   right upper paraspinal (recheck in 3 months)   Chronic cluster headache [facility]   PRN OXYGEN VIA [place] WHEN FEELS HEADACHE STARTING/  ON PREVENTION MEDS   Halo nevus 03/30/2016   moderate - right upper back (WS)   Hx of radiation therapy 12/28/11   prostate seed implant/75 seeds/,Dr.Dahlstedt   Hyperlipidemia    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   right angle of jaw - excision   Melanoma (HCC) 02/20/2006   left  lateral chest - excision   Melanoma in situ (HCC) 07/27/2009   right lobe of ear   Melanoma in situ (HCC) 10/01/2013   left jawline (MOHs)   Melanoma in situ (HCC) 10/01/2013   left cheek (MOHs)   Prostate cancer (HCC) DX  10/11/11   Adenocarcinoma,gleason 3+3=6,PSA=5.39   volume=30cc   PSA elevation    2009=3.4,2010=3.46,2011=3.16,2012=3.76,07/2011=4.85   SCCA (squamous cell carcinoma) of skin 03/03/2020   Right Lower Shin (Keratoacanthoma)   SCCA (squamous cell carcinoma) of skin 03/03/2020   Right Dorsal Hand (in situ)   Squamous cell carcinoma of dorsum of right hand 08/18/1997   CX3 + 5FU   Squamous cell carcinoma of skin 10/26/2015   Right thigh, proximal   Squamous cell carcinoma, face 10/01/2013   Right forehead - CX3 + 5FU   Squamous cell carcinoma, face 11/15/2017   Right bulb nose - tx p bx   Past Surgical History:  Procedure Laterality Date   APPENDECTOMY  AGE 63   COLONOSCOPY     SEVERAL AT [place] FOR [facility], NO POLYPS PER PT   PROSTATE BIOPSY  10/11/2011   gleason 3+3=6,PSA=5.39,Adenocarcinoma   PROSTATE SURGERY     RADIOACTIVE SEED IMPLANT  12/28/2011  Procedure: RADIOACTIVE SEED IMPLANT;  Surgeon: Garnette Shack, MD;  Location: [facility];  Service: Urology;  Laterality: N/A;  75 seeds implanted  no seeds found in bladder    reports that he quit smoking about 39 years ago. His smoking use included cigarettes. He started smoking about 49 years ago. He has a 2.5 pack-year smoking history. He has quit using smokeless tobacco.  His smokeless tobacco use included chew. He reports current alcohol use of about 6.0 - 8.0 standard drinks of alcohol per week. He reports that he does not use drugs. family history includes Cancer (age of onset: 59) in his brother; Colon cancer (age of onset: 58) in his brother; Colon polyps in his father; Heart disease in his maternal grandfather; Hyperlipidemia in his mother. Allergies[1] Medications Ordered Prior to  Encounter[2]      ROS:  All others reviewed and negative.  Objective        PE:  BP 124/82 (BP Location: Right Arm, Patient Position: Sitting, Cuff Size: Normal)   Pulse 65   Temp 97.7 F (36.5 C) (Oral)   Ht 5' 10 (1.778 m)   Wt 210 lb (95.3 kg)   SpO2 99%   BMI 30.13 kg/m                 Constitutional: Pt appears in NAD               HENT: Head: NCAT.                Right Ear: External ear normal.                 Left Ear: External ear normal.                Eyes: . Pupils are equal, round, and reactive to light. Conjunctivae and EOM are normal               Nose: without d/c or deformity               Neck: Neck supple. Gross normal ROM               Cardiovascular: Normal rate and regular rhythm.                 Pulmonary/Chest: Effort normal and breath sounds without rales or wheezing.                Abd:  Soft, NT, ND, + BS, no organomegaly but marked tender 5-8 left posterolat ribs               Neurological: Pt is alert. At baseline orientation, motor grossly intact               Skin: Skin is warm. No rashes, no other new lesions, LE edema - none               Psychiatric: Pt behavior is normal without agitation   Micro: none  Cardiac tracings I have personally interpreted today:  none  Pertinent Radiological findings (summarize): none   Lab Results  Component Value Date   WBC 5.1 04/30/2023   HGB 13.8 04/30/2023   HCT 41.2 04/30/2023   PLT 153.0 04/30/2023   GLUCOSE 96 04/30/2023   CHOL 163 04/30/2023   TRIG 106.0 04/30/2023   HDL 64.80 04/30/2023   LDLCALC 77 04/30/2023   ALT 26 04/30/2023   AST 20 04/30/2023   NA 140 04/30/2023   K  4.5 04/30/2023   CL 106 04/30/2023   CREATININE 0.96 04/30/2023   BUN 26 (H) 04/30/2023   CO2 27 04/30/2023   TSH 1.890 04/09/2018   PSA 0.02 (L) 04/30/2023   INR 0.94 12/21/2011   HGBA1C 5.6 04/30/2023   Assessment/Plan:  Allen Burnett is a 73 y.o. White or Caucasian [1] male with  has a past medical history of Acid  reflux (OCCASIONALLY--  TAKE PREVACID), Atypical nevus (02/20/2006), Atypical nevus (10/01/2013), Atypical nevus (10/01/2013), Atypical nevus (04/27/2016), Basal cell carcinoma (02/20/2006), Basal cell carcinoma (11/05/2013), Chronic cluster headache (FOLLOWED [facility]), Halo nevus (03/30/2016), radiation therapy (12/28/11), Hyperlipidemia, Lentigo maligna (HCC) (01/08/1998), Melanoma (HCC) (02/20/2006), Melanoma in situ (HCC) (07/27/2009), Melanoma in situ (HCC) (10/01/2013), Melanoma in situ (HCC) (10/01/2013), Prostate cancer (HCC) (DX  10/11/11), PSA elevation, SCCA (squamous cell carcinoma) of skin (03/03/2020), SCCA (squamous cell carcinoma) of skin (03/03/2020), Squamous cell carcinoma of dorsum of right hand (08/18/1997), Squamous cell carcinoma of skin (10/26/2015), Squamous cell carcinoma, face (10/01/2013), and Squamous cell carcinoma, face (11/15/2017).  Multiple rib fractures involving four or more ribs With significant pain but no other complications; no need fiurther imaging or lab today, but will need further pain control with percocet 7.5 325 mg prn,  to f/u any worsening symptoms or concerns  Followup: Return if symptoms worsen or fail to improve.  Allen Rush, MD 01/29/2024 10:36 AM [facility]     [1] No Known Allergies [2]  Current Outpatient Medications on File Prior to Visit  Medication Sig Dispense Refill   ALPRAZolam  (XANAX ) 0.5 MG tablet TAKE 1 TABLET BY MOUTH 3 TIMES A DAY AS NEEDED FOR ANXIETY 60 tablet 5   diclofenac (VOLTAREN) 75 MG EC tablet Take 75 mg by mouth. 4 times a week     FLUoxetine  (PROZAC ) 20 MG capsule Take 3 capsules (60 mg total) by mouth daily. 270 capsule 1   Melatonin 3-10 MG TABS Take by mouth.     Multiple Vitamin (MULTIVITAMIN WITH MINERALS) TABS tablet Take 1 tablet by mouth daily.     rosuvastatin  (CRESTOR ) 10 MG tablet TAKE 1 TABLET BY MOUTH DAILY 90 tablet  3   zolpidem  (AMBIEN ) 10 MG tablet TAKE 1 TABLET BY MOUTH EVERY NIGHT AT BEDTIME AS NEEDED FOR SLEEP 20 tablet 3   No current facility-administered medications on file prior to visit.

## 2024-04-22 ENCOUNTER — Ambulatory Visit: Admitting: Physician Assistant

## 2024-04-30 ENCOUNTER — Encounter: Admitting: Emergency Medicine

## 2024-09-29 ENCOUNTER — Ambulatory Visit
